# Patient Record
Sex: Male | Born: 1950 | Race: Black or African American | Hispanic: No | Marital: Married | Smoking: Never smoker
Health system: Southern US, Community
[De-identification: ages and names within clinical notes are randomized; demographics above are authoritative.]

## PROBLEM LIST (undated history)

## (undated) DIAGNOSIS — K219 Gastro-esophageal reflux disease without esophagitis: Secondary | ICD-10-CM

## (undated) DIAGNOSIS — T7840XA Allergy, unspecified, initial encounter: Secondary | ICD-10-CM

## (undated) DIAGNOSIS — Z974 Presence of external hearing-aid: Secondary | ICD-10-CM

## (undated) DIAGNOSIS — E785 Hyperlipidemia, unspecified: Secondary | ICD-10-CM

## (undated) DIAGNOSIS — H269 Unspecified cataract: Secondary | ICD-10-CM

## (undated) DIAGNOSIS — I10 Essential (primary) hypertension: Secondary | ICD-10-CM

## (undated) DIAGNOSIS — M109 Gout, unspecified: Secondary | ICD-10-CM

## (undated) DIAGNOSIS — M1990 Unspecified osteoarthritis, unspecified site: Secondary | ICD-10-CM

## (undated) DIAGNOSIS — E039 Hypothyroidism, unspecified: Secondary | ICD-10-CM

## (undated) DIAGNOSIS — J302 Other seasonal allergic rhinitis: Secondary | ICD-10-CM

## (undated) HISTORY — DX: Unspecified osteoarthritis, unspecified site: M19.90

## (undated) HISTORY — PX: CATARACT EXTRACTION, BILATERAL: SHX1313

## (undated) HISTORY — DX: Gout, unspecified: M10.9

## (undated) HISTORY — DX: Allergy, unspecified, initial encounter: T78.40XA

## (undated) HISTORY — PX: CARPAL TUNNEL RELEASE: SHX101

## (undated) HISTORY — DX: Other seasonal allergic rhinitis: J30.2

## (undated) HISTORY — DX: Unspecified cataract: H26.9

## (undated) HISTORY — DX: Hypothyroidism, unspecified: E03.9

## (undated) HISTORY — DX: Hyperlipidemia, unspecified: E78.5

## (undated) HISTORY — DX: Gastro-esophageal reflux disease without esophagitis: K21.9

## (undated) HISTORY — DX: Presence of external hearing-aid: Z97.4

## (undated) HISTORY — DX: Essential (primary) hypertension: I10

## (undated) HISTORY — PX: KNEE ARTHROSCOPY: SHX127

---

## 2002-12-29 ENCOUNTER — Ambulatory Visit (HOSPITAL_COMMUNITY): Admission: RE | Admit: 2002-12-29 | Discharge: 2002-12-29 | Payer: Self-pay | Admitting: Gastroenterology

## 2008-07-01 ENCOUNTER — Ambulatory Visit (HOSPITAL_BASED_OUTPATIENT_CLINIC_OR_DEPARTMENT_OTHER): Admission: RE | Admit: 2008-07-01 | Discharge: 2008-07-01 | Payer: Self-pay | Admitting: Orthopedic Surgery

## 2008-08-12 ENCOUNTER — Ambulatory Visit (HOSPITAL_BASED_OUTPATIENT_CLINIC_OR_DEPARTMENT_OTHER): Admission: RE | Admit: 2008-08-12 | Discharge: 2008-08-12 | Payer: Self-pay | Admitting: Orthopedic Surgery

## 2009-06-25 ENCOUNTER — Encounter: Admission: RE | Admit: 2009-06-25 | Discharge: 2009-06-25 | Payer: Self-pay | Admitting: Orthopedic Surgery

## 2009-11-23 ENCOUNTER — Encounter: Admission: RE | Admit: 2009-11-23 | Discharge: 2010-01-13 | Payer: Self-pay | Admitting: Orthopedic Surgery

## 2011-02-14 NOTE — Op Note (Signed)
NAME:  Brendan Simpson, Brendan Simpson NO.:  [id number]   MEDICAL RECORD NO.:  [id number]          PATIENT TYPE:  AMB   LOCATION:  DSC                          FACILITY:  [facility]   PHYSICIAN:  Artist Pais. Weingold, M.D.DATE OF BIRTH:  02/21/1950   DATE OF PROCEDURE:  08/12/2008  DATE OF DISCHARGE:                               OPERATIVE REPORT   PREOPERATIVE DIAGNOSIS:  Chronic left carpal tunnel syndrome.   POSTOPERATIVE DIAGNOSIS:  Chronic left carpal tunnel syndrome.   PROCEDURE:  Carpal tunnel release.   SURGEON:  Artist Pais. Mina Marble, MD   ASSISTANT:  None.   ANESTHESIA:  General.   TOURNIQUET TIME:  10 minutes.   COMPLICATIONS:  None.   DRAINS:  None.   OPERATIVE REPORT:  The patient was taken to the operating suite.  After  induction of adequate general anesthesia, the left upper extremity was  prepped and draped in the sterile fashion.  An Esmarch was used to  exsanguinate the limb.  Tourniquet was inflated to 250 mmHg.  At this  point in time, a 2-cm incision was made in the palmar aspect of the left  hand in line with the last metacarpal starting at Kaplan's cardinal  line.  Skin was incised.  Palmar fascia was identified and split.  Distal edge of the transverse carpal ligament identified with a 15  blade.  The median nerve was identified and protected with Market researcher.  Remaining aspects of the transverse carpal ligament then  divided under direct vision using curved and blunt scissors.  Canal was  inspected.  There were no osseous lesions or ganglions present.  It was  irrigated and nicely closed with a 3-0 Prolene subcuticular stitch.  Steri-Strips, 4 x 4s fluffs, and compressive dressing was applied.  The  patient tolerated the procedure well and went to recovery room in stable  fashion.      Artist Pais Mina Marble, M.D.  Electronically Signed     MAW/MEDQ  D:  08/12/2008  T:  08/12/2008  Job:  [id number]

## 2011-02-14 NOTE — Op Note (Signed)
NAME:  JAEDYN, LARD NO.:  [id number]   MEDICAL RECORD NO.:  [id number]          PATIENT TYPE:  AMB   LOCATION:  DSC                          FACILITY:  [facility]   PHYSICIAN:  Artist Pais. Weingold, M.D.DATE OF BIRTH:  02/21/1950   DATE OF PROCEDURE:  07/01/2008  DATE OF DISCHARGE:                               OPERATIVE REPORT   PREOPERATIVE DIAGNOSIS:  Chronic right carpal tunnel syndrome.   POSTOPERATIVE DIAGNOSIS:  Chronic right carpal tunnel syndrome.   PROCEDURE:  Right carpal tunnel release.   SURGEON:  Artist Pais. Mina Marble, MD   ASSISTANT:  None.   ANESTHESIA:  General.   TOURNIQUET TIME:  10 minutes.   COMPLICATIONS:  None.   DRAINS:  None.   OPERATIVE REPORT:  The patient was taken to the operating suite.  After  induction of adequate general anesthesia, right upper extremity was  prepped and draped in the usual sterile fashion.  An Esmarch was used to  exsanguinate the limb.  Tourniquet was inflated to 250 mmHg.  At this  point in time, a 2-cm incision was made in the palmar aspect of the  right hand in line with metacarpal starting at [facility] cardinal line.  The skin was incised.  The palmar fascia was identified and split.  The  distal edge of the transverse carpal ligament was identified and split  with a #15 blade.  The median nerve was identified and protected with a  Therapist, nutritional.  The remaining aspects of the transverse carpal ligament  were then divided under direct vision using curved blunt scissors.  The  canal was inspected.  No osseous lesions or ganglions were present.  It  was irrigated and loosely closed with 3-0 Prolene subcuticular stitch.  Steri-Strips, 4 x 4s fluffs, and a compressive dressing was applied.  The patient tolerated the procedure well and went to recovery room in  stable fashion.      Artist Pais Mina Marble, M.D.  Electronically Signed     MAW/MEDQ  D:  07/01/2008  T:  07/01/2008  Job:  [id number]

## 2011-02-17 NOTE — Op Note (Signed)
   NAME:  Brendan Simpson, Brendan Simpson                         ACCOUNT NO.:  [id number]   MEDICAL RECORD NO.:  [id number]                   PATIENT TYPE:  AMB   LOCATION:  ENDO                                 FACILITY:  [facility]   PHYSICIAN:  Anselmo Rod, M.D.               DATE OF BIRTH:  02/21/1950   DATE OF PROCEDURE:  12/29/2002  DATE OF DISCHARGE:                                 OPERATIVE REPORT   PROCEDURE PERFORMED:  Screening colonoscopy.   ENDOSCOPIST:  Charna Elizabeth, M.D.   INSTRUMENT USED:  Olympus video colonoscope.   INDICATIONS FOR PROCEDURE:  The patient is a 60 year old African-American  male undergoing screening colonoscopy to rule out colonic polyps, masses,  etc.   PREPROCEDURE PREPARATION:  Informed consent was procured from the patient.  The patient was fasted for eight hours prior to the procedure and prepped  with a bottle of MiraLax and Gatorade the night prior to the procedure.   PREPROCEDURE PHYSICAL:  The patient had stable vital signs.  Neck supple.  Chest clear to auscultation.  S1 and S2 regular.  Abdomen soft with normal  bowel sounds.   DESCRIPTION OF PROCEDURE:  The patient was placed in left lateral decubitus  position and sedated with 70 mg of Demerol and 7 mg of Versed intravenously.  Once the patient was adequately sedated and maintained on low flow oxygen  and continuous cardiac monitoring, the Olympus video colonoscope was  advanced from the rectum and terminal ileum with some difficulty.  There was  some residual stool in the colon and multiple washes were done.  No masses,  polyps, erosions, ulcerations or diverticula were seen.  The terminal ileum  appeared healthy without lesions.  Some residual stool as seen in the  dependent areas of the colon.  Therefore, small lesions could have been  missed.   IMPRESSION:  1. Essentially unrevealing colonoscopy to the terminal ileum.  2. Some residual stool in the colon, small lesions could have been  missed.    RECOMMENDATIONS:  1. Repeat colorectal cancer screening is recommended for the patient in the     next five years unless the patient develops any abnormal symptoms in the     interim.  2. Outpatient follow-up on a p.r.n. basis.                                                   Anselmo Rod, M.D.    JNM/MEDQ  D:  12/29/2002  T:  12/29/2002  Job:  [id number]   cc:   Marjory Lies, M.D.  [address]  [place]  [place] [address]  Fax: [phone]

## 2011-04-12 ENCOUNTER — Emergency Department (HOSPITAL_COMMUNITY): Payer: 59

## 2011-04-12 ENCOUNTER — Emergency Department (HOSPITAL_COMMUNITY)
Admission: EM | Admit: 2011-04-12 | Discharge: 2011-04-12 | Disposition: A | Discharge status: Home / Self Care | Payer: 59 | Attending: Emergency Medicine | Admitting: Emergency Medicine

## 2011-04-12 DIAGNOSIS — Z7982 Long term (current) use of aspirin: Secondary | ICD-10-CM | POA: Insufficient documentation

## 2011-04-12 DIAGNOSIS — R51 Headache: Secondary | ICD-10-CM | POA: Insufficient documentation

## 2011-04-12 DIAGNOSIS — Z79899 Other long term (current) drug therapy: Secondary | ICD-10-CM | POA: Insufficient documentation

## 2011-04-12 DIAGNOSIS — M109 Gout, unspecified: Secondary | ICD-10-CM | POA: Insufficient documentation

## 2011-04-12 DIAGNOSIS — K219 Gastro-esophageal reflux disease without esophagitis: Secondary | ICD-10-CM | POA: Insufficient documentation

## 2011-04-12 DIAGNOSIS — E7800 Pure hypercholesterolemia, unspecified: Secondary | ICD-10-CM | POA: Insufficient documentation

## 2011-04-12 LAB — CSF CELL COUNT WITH DIFFERENTIAL
RBC Count, CSF: 0 /mm3
RBC Count, CSF: 1 /mm3 — ABNORMAL HIGH
Tube #: 1
Tube #: 4
WBC, CSF: 2 /mm3 (ref 0–5)
WBC, CSF: 2 /mm3 (ref 0–5)

## 2011-04-12 LAB — CBC
HCT: 45.5 % (ref 39.0–52.0)
Hemoglobin: 16.2 g/dL (ref 13.0–17.0)
MCH: 31.3 pg (ref 26.0–34.0)
MCHC: 35.6 g/dL (ref 30.0–36.0)
MCV: 87.8 fL (ref 78.0–100.0)
Platelets: 207 10*3/uL (ref 150–400)
RBC: 5.18 MIL/uL (ref 4.22–5.81)
RDW: 12.5 % (ref 11.5–15.5)
WBC: 7.4 10*3/uL (ref 4.0–10.5)

## 2011-04-12 LAB — GRAM STAIN

## 2011-04-12 LAB — PROTIME-INR
INR: 0.93 (ref 0.00–1.49)
Prothrombin Time: 12.7 seconds (ref 11.6–15.2)

## 2011-04-12 LAB — BASIC METABOLIC PANEL
BUN: 9 mg/dL (ref 6–23)
CO2: 32 mEq/L (ref 19–32)
Calcium: 10.2 mg/dL (ref 8.4–10.5)
Chloride: 100 mEq/L (ref 96–112)
Creatinine, Ser: 0.94 mg/dL (ref 0.50–1.35)
GFR calc Af Amer: >60 mL/min (ref >60)
GFR calc non Af Amer: >60 mL/min (ref >60)
Glucose, Bld: 111 mg/dL — ABNORMAL HIGH (ref 70–99)
Potassium: 4.1 mEq/L (ref 3.5–5.1)
Sodium: 141 mEq/L (ref 135–145)

## 2011-04-12 LAB — GLUCOSE, CSF: Glucose, CSF: 69 mg/dL (ref 43–76)

## 2011-04-12 LAB — APTT: aPTT: 29 seconds (ref 24–37)

## 2011-04-12 LAB — PROTEIN, CSF: Total  Protein, CSF: 41 mg/dL (ref 15–45)

## 2011-04-16 LAB — CSF CULTURE W GRAM STAIN: Culture: NO GROWTH

## 2011-07-03 LAB — POCT HEMOGLOBIN-HEMACUE
Hemoglobin: 15.6
Hemoglobin: 15.6

## 2011-07-04 LAB — POCT HEMOGLOBIN-HEMACUE: Hemoglobin: 14.9

## 2016-02-03 DIAGNOSIS — H9190 Unspecified hearing loss, unspecified ear: Secondary | ICD-10-CM | POA: Insufficient documentation

## 2016-02-03 DIAGNOSIS — E039 Hypothyroidism, unspecified: Secondary | ICD-10-CM | POA: Insufficient documentation

## 2016-02-03 DIAGNOSIS — K219 Gastro-esophageal reflux disease without esophagitis: Secondary | ICD-10-CM | POA: Insufficient documentation

## 2016-02-03 DIAGNOSIS — M109 Gout, unspecified: Secondary | ICD-10-CM | POA: Insufficient documentation

## 2016-02-03 DIAGNOSIS — M1990 Unspecified osteoarthritis, unspecified site: Secondary | ICD-10-CM | POA: Insufficient documentation

## 2016-02-03 DIAGNOSIS — E785 Hyperlipidemia, unspecified: Secondary | ICD-10-CM | POA: Insufficient documentation

## 2016-02-03 DIAGNOSIS — I10 Essential (primary) hypertension: Secondary | ICD-10-CM | POA: Insufficient documentation

## 2016-02-03 DIAGNOSIS — R413 Other amnesia: Secondary | ICD-10-CM | POA: Insufficient documentation

## 2016-02-03 HISTORY — DX: Hypothyroidism, unspecified: E03.9

## 2016-03-08 ENCOUNTER — Encounter: Payer: Self-pay | Admitting: Podiatry

## 2016-03-08 ENCOUNTER — Ambulatory Visit (INDEPENDENT_AMBULATORY_CARE_PROVIDER_SITE_OTHER): Payer: 59 | Admitting: Podiatry

## 2016-03-08 ENCOUNTER — Ambulatory Visit (INDEPENDENT_AMBULATORY_CARE_PROVIDER_SITE_OTHER): Payer: 59

## 2016-03-08 VITALS — BP 130/75 | HR 73 | Resp 16 | Ht 68.0 in | Wt 180.0 lb

## 2016-03-08 DIAGNOSIS — R52 Pain, unspecified: Secondary | ICD-10-CM

## 2016-03-08 DIAGNOSIS — L84 Corns and callosities: Secondary | ICD-10-CM | POA: Diagnosis not present

## 2016-03-08 NOTE — Progress Notes (Signed)
Subjective:     Patient ID: Brendan CheeseDavid L Simpson, male   DOB: August 13, 1951, 65 y.o.   MRN: [id number]  HPI patient presents stating I have this very painful corn callus on the outside of my left foot that get inflamed and sore. Patient states that it's been going on for a few months and is gradually gotten worse and he's tried to trim it without relief   Review of Systems  All other systems reviewed and are negative.      Objective:   Physical Exam  Constitutional: He is oriented to person, place, and time.  Cardiovascular: Intact distal pulses.   Musculoskeletal: Normal range of motion.  Neurological: He is oriented to person, place, and time.  Skin: Skin is warm.  Nursing note and vitals reviewed.  neurovascular status intact muscle strength adequate range of motion within normal limits with patient found to have keratotic lesion on the outside of the left fifth metatarsal with irritation and a lucent light core. It is very tender when pressed with mild fluid accumulation associated with it. 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 At this time debridement accomplished with sharp sterile instrumentation and no iatrogenic bleeding was noted. May require further treatment depending on response and will be seen back as needed

## 2016-03-08 NOTE — Progress Notes (Signed)
   Subjective:    Patient ID: Brendan CheeseDavid L Evrard, male    DOB: [birth year], 65 y.o.   MRN: [id number]  HPI "A callus just came up on my foot about a month ago.  It's sore.  I feel like I need to have it cut off.  I haven't done anything to it.  It feels a little bit better since I made this appointment.  Walking aggravates it.  My shoes seem to be okay."    Review of Systems  Musculoskeletal: Positive for arthralgias.       Gout  Allergic/Immunologic: Positive for environmental allergies.  All other systems reviewed and are negative.      Objective:   Physical Exam        Assessment & Plan:

## 2016-03-09 DIAGNOSIS — R21 Rash and other nonspecific skin eruption: Secondary | ICD-10-CM | POA: Insufficient documentation

## 2016-09-06 LAB — HEPATIC FUNCTION PANEL
ALT: 31 U/L (ref 10–40)
AST: 21 U/L (ref 14–40)
Alkaline Phosphatase: 58 U/L (ref 25–125)
Bilirubin, Total: 0.3 mg/dL

## 2016-09-06 LAB — BASIC METABOLIC PANEL
BUN: 15 mg/dL (ref 4–21)
Creatinine: 1 mg/dL (ref 0.6–1.3)
Glucose: 112 mg/dL
Potassium: 4.1 mmol/L (ref 3.4–5.3)
Sodium: 139 mmol/L (ref 137–147)

## 2016-09-06 LAB — CBC AND DIFFERENTIAL
HCT: 45 % (ref 41–53)
Hemoglobin: 15.4 g/dL (ref 13.5–17.5)
Neutrophils Absolute: 4 /uL
Platelets: 201 10*3/uL (ref 150–399)
WBC: 7 10^3/mL

## 2016-09-06 LAB — TSH: TSH: 0.79 u[IU]/mL (ref 0.41–5.90)

## 2016-09-06 LAB — HEMOGLOBIN A1C: Hemoglobin A1C: 5.8

## 2016-09-06 LAB — LIPID PANEL
Cholesterol: 189 mg/dL (ref 0–200)
HDL: 41 mg/dL (ref 35–70)
LDL Cholesterol: 111 mg/dL
Triglycerides: 196 mg/dL — AB (ref 40–160)

## 2016-12-18 ENCOUNTER — Other Ambulatory Visit: Payer: Self-pay

## 2016-12-20 ENCOUNTER — Encounter: Payer: Self-pay | Admitting: Surgical

## 2016-12-28 ENCOUNTER — Encounter: Payer: Self-pay | Admitting: *Deleted

## 2016-12-28 ENCOUNTER — Telehealth: Payer: Self-pay | Admitting: *Deleted

## 2016-12-28 NOTE — Telephone Encounter (Signed)
PreVisit Call completed. Pt states he would like his blood pressure checked. States he occasionally becomes lightheaded (maybe every 2 weeks) and when he checks his blood pressure the readings are around 180/96. Denies headaches and blurred vision. States he takes his BP meds in the morning. He will start monitoring his BP morning and night and bring readings in with him.

## 2017-01-01 ENCOUNTER — Ambulatory Visit: Payer: 59 | Admitting: Family Medicine

## 2017-01-01 ENCOUNTER — Ambulatory Visit (INDEPENDENT_AMBULATORY_CARE_PROVIDER_SITE_OTHER): Payer: 59 | Admitting: Family Medicine

## 2017-01-01 ENCOUNTER — Encounter: Payer: Self-pay | Admitting: Family Medicine

## 2017-01-01 VITALS — BP 154/90 | HR 77 | Temp 98.4°F | Ht 65.35 in | Wt 195.0 lb

## 2017-01-01 DIAGNOSIS — I10 Essential (primary) hypertension: Secondary | ICD-10-CM | POA: Diagnosis not present

## 2017-01-01 NOTE — Progress Notes (Signed)
Pre visit review using our clinic review tool, if applicable. No additional management support is needed unless otherwise documented below in the visit note. 

## 2017-01-01 NOTE — Patient Instructions (Signed)
DASH Eating Plan DASH stands for "Dietary Approaches to Stop Hypertension." The DASH eating plan is a healthy eating plan that has been shown to reduce high blood pressure (hypertension). It may also reduce your risk for type 2 diabetes, heart disease, and stroke. The DASH eating plan may also help with weight loss. What are tips for following this plan? General guidelines  Avoid eating more than 2,300 mg (milligrams) of salt (sodium) a day. If you have hypertension, you may need to reduce your sodium intake to 1,500 mg a day.  Limit alcohol intake to no more than 1 drink a day for nonpregnant women and 2 drinks a day for men. One drink equals 12 oz of beer, 5 oz of wine, or 1 oz of hard liquor.  Work with your health care provider to maintain a healthy body weight or to lose weight. Ask what an ideal weight is for you.  Get at least 30 minutes of exercise that causes your heart to beat faster (aerobic exercise) most days of the week. Activities may include walking, swimming, or biking.  Work with your health care provider or diet and nutrition specialist (dietitian) to adjust your eating plan to your individual calorie needs. Reading food labels  Check food labels for the amount of sodium per serving. Choose foods with less than 5 percent of the Daily Value of sodium. Generally, foods with less than 300 mg of sodium per serving fit into this eating plan.  To find whole grains, look for the word "whole" as the first word in the ingredient list. Shopping  Buy products labeled as "low-sodium" or "no salt added."  Buy fresh foods. Avoid canned foods and premade or frozen meals. Cooking  Avoid adding salt when cooking. Use salt-free seasonings or herbs instead of table salt or sea salt. Check with your health care provider or pharmacist before using salt substitutes.  Do not fry foods. Cook foods using healthy methods such as baking, boiling, grilling, and broiling instead.  Cook with  heart-healthy oils, such as olive, canola, soybean, or sunflower oil. Meal planning   Eat a balanced diet that includes: ? 5 or more servings of fruits and vegetables each day. At each meal, try to fill half of your plate with fruits and vegetables. ? Up to 6-8 servings of whole grains each day. ? Less than 6 oz of lean meat, poultry, or fish each day. A 3-oz serving of meat is about the same size as a deck of cards. One egg equals 1 oz. ? 2 servings of low-fat dairy each day. ? A serving of nuts, seeds, or beans 5 times each week. ? Heart-healthy fats. Healthy fats called Omega-3 fatty acids are found in foods such as flaxseeds and coldwater fish, like sardines, salmon, and mackerel.  Limit how much you eat of the following: ? Canned or prepackaged foods. ? Food that is high in trans fat, such as fried foods. ? Food that is high in saturated fat, such as fatty meat. ? Sweets, desserts, sugary drinks, and other foods with added sugar. ? Full-fat dairy products.  Do not salt foods before eating.  Try to eat at least 2 vegetarian meals each week.  Eat more home-cooked food and less restaurant, buffet, and fast food.  When eating at a restaurant, ask that your food be prepared with less salt or no salt, if possible. What foods are recommended? The items listed may not be a complete list. Talk with your dietitian about what   dietary choices are best for you. Grains Whole-grain or whole-wheat bread. Whole-grain or whole-wheat pasta. Brown rice. Oatmeal. Quinoa. Bulgur. Whole-grain and low-sodium cereals. Pita bread. Low-fat, low-sodium crackers. Whole-wheat flour tortillas. Vegetables Fresh or frozen vegetables (raw, steamed, roasted, or grilled). Low-sodium or reduced-sodium tomato and vegetable juice. Low-sodium or reduced-sodium tomato sauce and tomato paste. Low-sodium or reduced-sodium canned vegetables. Fruits All fresh, dried, or frozen fruit. Canned fruit in natural juice (without  added sugar). Meat and other protein foods Skinless chicken or turkey. Ground chicken or turkey. Pork with fat trimmed off. Fish and seafood. Egg whites. Dried beans, peas, or lentils. Unsalted nuts, nut butters, and seeds. Unsalted canned beans. Lean cuts of beef with fat trimmed off. Low-sodium, lean deli meat. Dairy Low-fat (1%) or fat-free (skim) milk. Fat-free, low-fat, or reduced-fat cheeses. Nonfat, low-sodium ricotta or cottage cheese. Low-fat or nonfat yogurt. Low-fat, low-sodium cheese. Fats and oils Soft margarine without trans fats. Vegetable oil. Low-fat, reduced-fat, or light mayonnaise and salad dressings (reduced-sodium). Canola, safflower, olive, soybean, and sunflower oils. Avocado. Seasoning and other foods Herbs. Spices. Seasoning mixes without salt. Unsalted popcorn and pretzels. Fat-free sweets. What foods are not recommended? The items listed may not be a complete list. Talk with your dietitian about what dietary choices are best for you. Grains Baked goods made with fat, such as croissants, muffins, or some breads. Dry pasta or rice meal packs. Vegetables Creamed or fried vegetables. Vegetables in a cheese sauce. Regular canned vegetables (not low-sodium or reduced-sodium). Regular canned tomato sauce and paste (not low-sodium or reduced-sodium). Regular tomato and vegetable juice (not low-sodium or reduced-sodium). Pickles. Olives. Fruits Canned fruit in a light or heavy syrup. Fried fruit. Fruit in cream or butter sauce. Meat and other protein foods Fatty cuts of meat. Ribs. Fried meat. Bacon. Sausage. Bologna and other processed lunch meats. Salami. Fatback. Hotdogs. Bratwurst. Salted nuts and seeds. Canned beans with added salt. Canned or smoked fish. Whole eggs or egg yolks. Chicken or turkey with skin. Dairy Whole or 2% milk, cream, and half-and-half. Whole or full-fat cream cheese. Whole-fat or sweetened yogurt. Full-fat cheese. Nondairy creamers. Whipped toppings.  Processed cheese and cheese spreads. Fats and oils Butter. Stick margarine. Lard. Shortening. Ghee. Bacon fat. Tropical oils, such as coconut, palm kernel, or palm oil. Seasoning and other foods Salted popcorn and pretzels. Onion salt, garlic salt, seasoned salt, table salt, and sea salt. Worcestershire sauce. Tartar sauce. Barbecue sauce. Teriyaki sauce. Soy sauce, including reduced-sodium. Steak sauce. Canned and packaged gravies. Fish sauce. Oyster sauce. Cocktail sauce. Horseradish that you find on the shelf. Ketchup. Mustard. Meat flavorings and tenderizers. Bouillon cubes. Hot sauce and Tabasco sauce. Premade or packaged marinades. Premade or packaged taco seasonings. Relishes. Regular salad dressings. Where to find more information:  National Heart, Lung, and Blood Institute: www.nhlbi.nih.gov  American Heart Association: www.heart.org Summary  The DASH eating plan is a healthy eating plan that has been shown to reduce high blood pressure (hypertension). It may also reduce your risk for type 2 diabetes, heart disease, and stroke.  With the DASH eating plan, you should limit salt (sodium) intake to 2,300 mg a day. If you have hypertension, you may need to reduce your sodium intake to 1,500 mg a day.  When on the DASH eating plan, aim to eat more fresh fruits and vegetables, whole grains, lean proteins, low-fat dairy, and heart-healthy fats.  Work with your health care provider or diet and nutrition specialist (dietitian) to adjust your eating plan to your individual   calorie needs. This information is not intended to replace advice given to you by your health care provider. Make sure you discuss any questions you have with your health care provider. Document Released: 09/07/2011 Document Revised: 09/11/2016 Document Reviewed: 09/11/2016 Elsevier Interactive Patient Education  2017 Elsevier Inc.  

## 2017-01-01 NOTE — Progress Notes (Signed)
Brendan Simpson is a 66 y.o. male is here to [facility].   History of Present Illness:   Chief Complaint  Patient presents with  . Establish Care  . Hypertension   Hypertension  This is a chronic problem. The problem is uncontrolled. Pertinent negatives include no blurred vision, chest pain, headaches, malaise/fatigue, neck pain, palpitations or shortness of breath. Risk factors for coronary artery disease include male gender, obesity, sedentary lifestyle, dyslipidemia and family history. Past treatments include calcium channel blockers. The current treatment provides mild improvement. Compliance problems include diet and exercise.  There is no history of angina, kidney disease or retinopathy.   Health Maintenance Due  Topic Date Due  . Hepatitis C Screening  November 28, 1950  . HIV Screening  02/21/1968  . TETANUS/TDAP  09/19/2016   PMHx, SurgHx, SocialHx, Medications, and Allergies were reviewed in the Visit Navigator and updated as appropriate.   Past Medical History:  Diagnosis Date  . GERD (gastroesophageal reflux disease)   . Gout   . Hypertension   . Hypothyroidism 02/03/2016  . Pure hypercholesterolemia 02/03/2016  . Thyroid disease    Past Surgical History:  Procedure Laterality Date  . CARPAL TUNNEL RELEASE Bilateral   . KNEE ARTHROSCOPY Left    Family History  Problem Relation Age of Onset  . Breast cancer Mother   . Heart disease Father    Social History  Substance Use Topics  . Smoking status: Never Smoker  . Smokeless tobacco: Never Used  . Alcohol use No     Comment: every now and then   Current Medications and Allergies:   .  allopurinol (ZYLOPRIM) 100 MG tablet, Take 100 mg by mouth daily., Disp: , Rfl:  .  amLODipine (NORVASC) 5 MG tablet, Take 5 mg by mouth daily., Disp: , Rfl:  .  aspirin EC 81 MG tablet, Take 81 mg by mouth daily., Disp: , Rfl:  .  atorvastatin (LIPITOR) 40 MG tablet, Take 20 mg by mouth daily. , Disp: , Rfl:  .  azelaic acid  (AZELEX) 20 % cream, Apply topically as needed. After skin is thoroughly washed and patted dry, gently but thoroughly massage a thin film of azelaic acid cream into the affected area twice daily, in the morning and evening., Disp: , Rfl:  .  Co-Enzyme Q-10 30 MG CAPS, Take 200 mg by mouth daily., Disp: , Rfl:  .  esomeprazole (NEXIUM) 40 MG capsule, Take 40 mg by mouth daily., Disp: , Rfl:  .  Flaxseed, Linseed, (FLAXSEED OIL) 1000 MG CAPS, Take 1,000 mg by mouth daily., Disp: , Rfl:  .  levothyroxine (SYNTHROID, LEVOTHROID) 88 MCG tablet, Take 88 mcg by mouth daily before breakfast., Disp: , Rfl:   No Known Allergies   Review of Systems:   Review of Systems  Constitutional: Negative for chills, fever, malaise/fatigue and weight loss.  HENT: Positive for hearing loss. Negative for congestion, ear discharge, ear pain, nosebleeds, sinus pain, sore throat and tinnitus.   Eyes: Negative for blurred vision, double vision, pain and redness.  Respiratory: Negative for cough, shortness of breath and wheezing.   Cardiovascular: Negative for chest pain, palpitations and leg swelling.  Gastrointestinal: Negative for abdominal pain, blood in stool, constipation, diarrhea, heartburn, nausea and vomiting.  Genitourinary: Positive for frequency. Negative for dysuria and urgency.  Musculoskeletal: Negative for back pain, falls, joint pain and neck pain.  Skin: Negative for itching and rash.  Neurological: Negative for dizziness, tingling, tremors, speech change, seizures, weakness and  headaches.  Endo/Heme/Allergies: Does not bruise/bleed easily.  Psychiatric/Behavioral: Negative for depression, hallucinations, memory loss, substance abuse and suicidal ideas. The patient is not nervous/anxious and does not have insomnia.     Vitals:   Vitals:   01/01/17 1029  BP: (!) 154/90  Pulse: 77  Temp: 98.4 F (36.9 C)  TempSrc: Oral  SpO2: 97%  Weight: 195 lb (88.5 kg)  Height: 5' 5.35" (1.66 m)     Body  mass index is 32.1 kg/m.   Physical Exam:   Physical Exam  Constitutional: He is oriented to person, place, and time. He appears well-developed and well-nourished. No distress.  HENT:  Head: Normocephalic and atraumatic.  Right Ear: External ear normal.  Left Ear: External ear normal.  Nose: Nose normal.  Mouth/Throat: Oropharynx is clear and moist.  Eyes: Conjunctivae and EOM are normal. Pupils are equal, round, and reactive to light.  Neck: Normal range of motion. Neck supple.  Cardiovascular: Normal rate, regular rhythm, normal heart sounds and intact distal pulses.   Pulmonary/Chest: Effort normal and breath sounds normal.  Abdominal: Soft. Bowel sounds are normal.  Musculoskeletal: Normal range of motion.  Neurological: He is alert and oriented to person, place, and time.  Skin: Skin is warm and dry.  Psychiatric: He has a normal mood and affect. His behavior is normal. Judgment and thought content normal.  Nursing note and vitals reviewed.    Assessment and Plan:    Brendan Simpson was seen today for establish care and hypertension.  Diagnoses and all orders for this visit:  Benign essential hypertension Comments: We reviewed options - medications versus lifestyle changes. The patient would like to try lifestyle changes first. I referred to the Provider Referral Exercise Program at the [facility]. Recheck in 3-4 months.    . Reviewed expectations re: course of current medical issues. . Discussed self-management of symptoms. . Outlined signs and symptoms indicating need for more acute intervention. . Patient verbalized understanding and all questions were answered. . See orders for this visit as documented in the electronic medical record. . Patient received an After Visit Summary.  Records requested if needed. I spent 30 minutes with this patient, greater than 50% was face-to-face time counseling regarding the above diagnoses.  CMA served as Neurosurgeon during this visit.  History, Physical, and Plan performed by medical provider. Documentation and orders reviewed and attested to. Helane Rima, D.O.  Helane Rima, D.O. [place], [place] 01/01/2017   Follow-up: No Follow-up on file.  No orders of the defined types were placed in this encounter.  There are no discontinued medications. No orders of the defined types were placed in this encounter.

## 2017-01-15 NOTE — Progress Notes (Signed)
[facility] PREP Progress Report   Patient Details  Name: CASSELL VOORHIES MRN: [id number] Date of Birth: [birth year] Age: 66 y.o. PCP: Helane Rima, DO  Vitals:   01/15/17 1217  BP: (!) 160/100  Pulse: 78  Resp: 18  SpO2: 98%  Weight: 191 lb 9.6 oz (86.9 kg)  Height:  (1.702 m)         [facility] Eval - 01/15/17 1200      Referral    Referring Provider Dr. Earlene Plater   Reason for referral High Cholesterol;Hypertension;Obesitity/Overweight;Family History   Program Start Date 01/15/17     Measurement   Neck measurement 17 Inches   Waist Circumference 40.5 inches   Body fat 25.1 percent     Information for Trainer   Goals to lose 10lbs   Current Exercise walks approx 4 miles every other day   Orthopedic Concerns wears knee braces with strenuous exercise   Pertinent Medical History HTN, GERD, high cholesterol   Current Barriers none     Timed Up and Go (TUGS)   Timed Up and Go Low risk <9 seconds     Mobility and Daily Activities   I find it easy to walk up or down two or more flights of stairs. 4   I have no trouble taking out the trash. 4   I do housework such as vacuuming and dusting on my own without difficulty. 4   I can easily lift a gallon of milk (8lbs). 4   I can easily walk a mile. 4   I have no trouble reaching into high cupboards or reaching down to pick up something from the floor. 4   I do not have trouble doing out-door work such as Loss adjuster, chartered, raking leaves, or gardening. 4     Mobility and Daily Activities   I feel younger than my age. 4   I feel independent. 4   I feel energetic. 4   I live an active life.  4   I feel strong. 4   I feel healthy. 4   I feel active as other people my age. 4     How fit and strong are you.   Fit and Strong Total Score 56     Past Medical History:  Diagnosis Date  . GERD (gastroesophageal reflux disease)   . Gout   . Hypertension   . Hypothyroidism 02/03/2016  . Pure hypercholesterolemia 02/03/2016   . Thyroid disease    Past Surgical History:  Procedure Laterality Date  . CARPAL TUNNEL RELEASE Bilateral   . KNEE ARTHROSCOPY Left    History  Smoking Status  . Never Smoker  Smokeless Tobacco  . Never Used       Comments:  Aarush would like to lose 10 lbs to reduce his risk for diabetes and help control his BP.  He would like to establish a strength training routine in order to continue with it when he completes the 12-week program.     Kaiel's preferred day for training is Tuesday and Thursday and preferred time is Morning (9AM -12PM)   Rose Fillers 01/15/2017, 12:20 PM

## 2017-01-19 NOTE — Progress Notes (Signed)
[facility] PREP Weekly Session   Patient Details  Name: Brendan Simpson MRN: [id number] Date of Birth: 1951-03-19 Age: 66 y.o. PCP: Helane Rima, DO  There were no vitals filed for this visit.      [facility] Weekly seesion - 01/19/17 1200      Weekly Session   Topic Discussed Calorie breakdown   Classes attended to date 1     Things you are grateful for:"grandkids"  Rose Fillers 01/19/2017, 12:42 PM

## 2017-01-24 ENCOUNTER — Ambulatory Visit: Payer: 59 | Admitting: Family Medicine

## 2017-01-24 NOTE — Progress Notes (Signed)
[facility] PREP Weekly Session   Patient Details  Name: Brendan Simpson MRN: [id number] Date of Birth: Sep 03, 1951 Age: 66 y.o. PCP: Helane Rima, DO  Vitals:   01/24/17 1358  Weight: 193 lb (87.5 kg)        [facility] Weekly seesion - 01/24/17 1300      Weekly Session   Topic Discussed Hitting roadblocks   Classes attended to date 2     Fun things you did since last meeting:"walking in park" Things you are grateful for:"my church"   Rose Fillers 01/24/2017, 1:58 PM

## 2017-01-30 ENCOUNTER — Telehealth: Payer: Self-pay | Admitting: Family Medicine

## 2017-01-30 NOTE — Telephone Encounter (Signed)
58 pages received from [facility] sent the to Dr.Wallace at [facility]. Pwr

## 2017-02-09 NOTE — Progress Notes (Signed)
[facility]   Patient Details  Name: Brendan CheeseDavid L Simpson MRN: [id number] Date of Birth: 1951/08/17 Age: 66 y.o. PCP: Helane RimaWallace, Erica, DO  Vitals:   02/09/17 1003  Weight: 192 lb (87.1 kg)        [facility] Weekly seesion - 02/09/17 1000      Weekly Session   Topic Discussed Health habits;Healthy eating tips   Classes attended to date 3     Things you are grateful for:"my wife"  Rose FillersDebbie Keyondre Hepburn 02/09/2017, 10:03 AM

## 2017-02-14 NOTE — Progress Notes (Signed)
[facility]   Patient Details  Name: Brendan Simpson MRN: [id number] Date of Birth: Dec 27, 1950 Age: 66 y.o. PCP: Helane RimaWallace, Erica, DO  Vitals:   02/14/17 1249  Weight: 189 lb (85.7 kg)        [facility] Weekly seesion - 02/14/17 1200      Weekly Session   Topic Discussed Other ways to be active   Minutes exercised this week 380 minutes  180cardio/180strength/430flexibility   Classes attended to date 4   Comments Brendan Simpson had not been reporting his minutes of exercise until this week.  He states he has been exercising regularly, however.      Thing you are grateful for:"[facility]"  Rose FillersDebbie Tabor Bartram 02/14/2017, 12:51 PM

## 2017-02-28 NOTE — Progress Notes (Signed)
[facility]   Patient Details  Name: Brendan CheeseDavid L Lybarger MRN: [id number] Date of Birth: 1950/12/23 Age: 66 y.o. PCP: Helane RimaWallace, Erica, DO  There were no vitals filed for this visit.      [facility] Weekly seesion - 02/28/17 1100      Weekly Session   Topic Discussed Health habits   Minutes exercised this week --  undocumented, but pt exercises regularly   Classes attended to date 5     Things you are grateful for:"today"  Rose FillersDebbie Onetta Spainhower 02/28/2017, 11:32 AM

## 2017-03-09 NOTE — Progress Notes (Signed)
[facility]   Patient Details  Name: Brendan CheeseDavid L Tyer MRN: [id number] Date of Birth: [birth year] Age: 66 y.o. PCP: Helane RimaWallace, Erica, DO  Vitals:   03/09/17 1000  Weight: 189 lb (85.7 kg)        [facility] Weekly seesion - 03/09/17 1000      Weekly Session   Topic Discussed Stress management and problem solving   Minutes exercised this week --  regularly coming to exercise but didn't report time   Classes attended to date 6      Fun things you did since last meeting:"walk in the park"  Rose FillersDebbie Ladale Sherburn 03/09/2017, 10:26 AM

## 2017-03-14 NOTE — Progress Notes (Signed)
[facility]   Patient Details  Name: Brendan Simpson MRN: [id number] Date of Birth: 09-23-1951 Age: 66 y.o. PCP: Brendan Simpson, Erica, Brendan Simpson  Vitals:   03/14/17 1350  Weight: 188 lb (85.3 kg)        [facility] Weekly seesion - 03/14/17 1300      Weekly Session   Topic Discussed Other  guest speaker   Minutes exercised this week --  not reported   Classes attended to date 7     Things you are grateful for:"one day at a time"    Rose FillersDebbie Adonte Vanriper 03/14/2017, 1:51 PM

## 2017-03-19 ENCOUNTER — Other Ambulatory Visit: Payer: Self-pay | Admitting: Family Medicine

## 2017-03-19 ENCOUNTER — Encounter: Payer: Self-pay | Admitting: Gastroenterology

## 2017-03-19 ENCOUNTER — Encounter: Payer: Self-pay | Admitting: Family Medicine

## 2017-03-19 ENCOUNTER — Ambulatory Visit (INDEPENDENT_AMBULATORY_CARE_PROVIDER_SITE_OTHER): Payer: 59 | Admitting: Family Medicine

## 2017-03-19 VITALS — BP 128/76 | HR 69 | Temp 98.5°F | Ht 65.35 in | Wt 187.2 lb

## 2017-03-19 DIAGNOSIS — Z1159 Encounter for screening for other viral diseases: Secondary | ICD-10-CM | POA: Diagnosis not present

## 2017-03-19 DIAGNOSIS — Z0000 Encounter for general adult medical examination without abnormal findings: Secondary | ICD-10-CM | POA: Diagnosis not present

## 2017-03-19 DIAGNOSIS — I10 Essential (primary) hypertension: Secondary | ICD-10-CM | POA: Diagnosis not present

## 2017-03-19 DIAGNOSIS — Z125 Encounter for screening for malignant neoplasm of prostate: Secondary | ICD-10-CM

## 2017-03-19 DIAGNOSIS — K2100 Gastro-esophageal reflux disease with esophagitis, without bleeding: Secondary | ICD-10-CM

## 2017-03-19 DIAGNOSIS — Z114 Encounter for screening for human immunodeficiency virus [HIV]: Secondary | ICD-10-CM

## 2017-03-19 DIAGNOSIS — Z23 Encounter for immunization: Secondary | ICD-10-CM

## 2017-03-19 LAB — PSA: PSA: 1.62 ng/mL (ref 0.10–4.00)

## 2017-03-19 MED ORDER — ATORVASTATIN CALCIUM 40 MG PO TABS: 20.0000 mg | ORAL_TABLET | Freq: Every day | ORAL | 2 refills | Status: DC

## 2017-03-19 MED ORDER — ALLOPURINOL 100 MG PO TABS: 100.0000 mg | ORAL_TABLET | Freq: Every day | ORAL | 2 refills | Status: DC

## 2017-03-19 MED ORDER — LEVOTHYROXINE SODIUM 88 MCG PO TABS: 88.0000 ug | ORAL_TABLET | Freq: Every day | ORAL | 2 refills | Status: DC

## 2017-03-19 MED ORDER — AZELAIC ACID 20 % EX CREA: TOPICAL_CREAM | CUTANEOUS | 2 refills | Status: DC | PRN

## 2017-03-19 MED ORDER — ESOMEPRAZOLE MAGNESIUM 40 MG PO CPDR: 40.0000 mg | DELAYED_RELEASE_CAPSULE | Freq: Every day | ORAL | 2 refills | Status: DC

## 2017-03-19 MED ORDER — AMLODIPINE BESYLATE 5 MG PO TABS: 5.0000 mg | ORAL_TABLET | Freq: Every day | ORAL | 2 refills | Status: DC

## 2017-03-19 NOTE — Progress Notes (Signed)
Subjective:  Brendan Simpson CMA acting as scribe for Dr. Earlene PlaterWallace.   Brendan Simpson is a 66 y.o. male and is here for a comprehensive physical exam.  HPI Patient comes in today for a physical. He has no complaints today. He is down 8 pounds from his last visit. [facility] BP normal today. Hx of GERD, on Nexium, for 15 years. Still with dyspepsia at times. Concerned about the possibility of Barrett's.   Health Maintenance Due  Topic Date Due  . Hepatitis C Screening  1951-08-03  . HIV Screening  02/21/1968   Prostate Symptoms Questionnaire: 1. Have you had the sensation of not emptying your bladder completely after you finished urinating? No. 2. Have you had to urinate again less than two hours after you finished urinating? No. 3. Have you found you stopped and started again several times when you urinated? No. 4. Have you found it difficult to postpone urination? No. 5. Have you had a weak urinary stream? No. 6. Have you had to push or strain to begin urination? No. 7. How many times did you most typically get up to urinate from the time you went to bed at night until the time you got up in the morning? 2-3  PMHx, SurgHx, SocialHx, Medications, and Allergies were reviewed in the Visit Navigator and updated as appropriate.   Past Medical History:  Diagnosis Date  . GERD (gastroesophageal reflux disease)   . Gout   . Hypertension   . Hypothyroidism 02/03/2016  . Pure hypercholesterolemia 02/03/2016  . Thyroid disease     Past Surgical History:  Procedure Laterality Date  . CARPAL TUNNEL RELEASE Bilateral   . KNEE ARTHROSCOPY Left     Family History  Problem Relation Age of Onset  . Breast cancer Mother   . Heart disease Father    Social History  Substance Use Topics  . Smoking status: Never Smoker  . Smokeless tobacco: Never Used  . Alcohol use No     Comment: every now and then   Review of Systems:   Review of Systems    Constitutional: Negative for chills, fever and malaise/fatigue.  HENT: Negative for congestion, ear pain and sore throat.   Eyes: Negative for blurred vision and double vision.  Respiratory: Negative for cough, shortness of breath and wheezing.   Cardiovascular: Negative for chest pain, palpitations and leg swelling.  Gastrointestinal: Negative for abdominal pain, diarrhea and vomiting.  Musculoskeletal: Negative for back pain, joint pain and neck pain.  Neurological: Negative for dizziness and headaches.  Psychiatric/Behavioral: Negative for depression and memory loss.   Objective:    Vitals:   03/19/17 0836  BP: 128/76  Pulse: 69  Temp: 98.5 F (36.9 C)   Body mass index is 30.82 kg/m.  General  Alert, cooperative, no distress, appears stated age  Head:  Normocephalic, without obvious abnormality, atraumatic  Eyes:  PERRL, conjunctiva/corneas clear, EOM's intact, fundi benign, both eyes       Ears:  Normal TM's and external ear canals, both ears  Nose: Nares normal, septum midline, mucosa normal, no drainage or sinus tenderness  Throat: Lips, mucosa, and tongue normal; teeth and gums normal  Neck: Supple, symmetrical, trachea midline, no adenopathy;     thyroid:  No enlargement/tenderness/nodules; no carotid bruit or JVD  Back:   Symmetric, no curvature, ROM normal, no CVA tenderness  Lungs:   Clear to auscultation bilaterally, respirations unlabored  Chest wall:  No tenderness or deformity  Heart:  Regular rate and rhythm, S1 and S2 normal, no murmur, rub or gallop  Abdomen:   Soft, non-tender, bowel sounds active all four quadrants, no masses, no organomegaly  Extremities: Extremities normal, atraumatic, no cyanosis or edema  Prostate : Not done  Skin: Skin color, texture, turgor normal, no rashes or lesions  Lymph nodes: Cervical, supraclavicular, and axillary nodes normal  Neurologic: CNII-XII grossly intact. Normal strength, sensation and reflexes throughout    AssessmentPlan:   Kohner was seen today for annual exam.  Diagnoses and all orders for this visit:  Routine physical examination  Need for prophylactic vaccination against diphtheria-tetanus-pertussis (DTP) -     Tdap vaccine greater than or equal to 7yo IM  Encounter for hepatitis C screening test for low risk patient -     Hepatitis C antibody, reflex  Encounter for screening for HIV -     HIV antibody  Chronic reflux esophagitis Comments: Worth referral to GI to discuss EGD. Continue PPI. Orders: -     Ambulatory referral to Gastroenterology  Screening PSA (prostate specific antigen) -     PSA  Benign essential hypertension Comments: BP in normal range with weight loss. I congratulated him.  REFILL OF ALL MEDICATIONS - 90 DAYS -     levothyroxine (SYNTHROID, LEVOTHROID) 88 MCG tablet; Take 1 tablet (88 mcg total) by mouth daily before breakfast. -     esomeprazole (NEXIUM) 40 MG capsule; Take 1 capsule (40 mg total) by mouth daily. -     azelaic acid (AZELEX) 20 % cream; Apply topically as needed. After skin is thoroughly washed and patted dry, gently but thoroughly massage a thin film of azelaic acid cream into the affected area twice daily, in the morning and evening. -     atorvastatin (LIPITOR) 40 MG tablet; Take 0.5 tablets (20 mg total) by mouth daily. -     amLODipine (NORVASC) 5 MG tablet; Take 1 tablet (5 mg total) by mouth daily. -     allopurinol (ZYLOPRIM) 100 MG tablet; Take 1 tablet (100 mg total) by mouth daily.   Patient Counseling: [x]   Nutrition: Stressed importance of moderation in sodium/caffeine intake, saturated fat and cholesterol, caloric balance, sufficient intake of fresh fruits, vegetables, and fiber  [x]   Stressed the importance of regular exercise.   []   Substance Abuse: Discussed cessation/primary prevention of tobacco, alcohol, or other drug use; driving or other dangerous activities under the influence; availability of treatment for  abuse.   [x]   Injury prevention: Discussed safety belts, safety helmets, smoke detector, smoking near bedding or upholstery.   []   Sexuality: Discussed sexually transmitted diseases, partner selection, use of condoms, avoidance of unintended pregnancy  and contraceptive alternatives.   [x]   Dental health: Discussed importance of regular tooth brushing, flossing, and dental visits.  [x]   Health maintenance and immunizations reviewed. Please refer to Health maintenance section.    Helane Rima, DO [facility] [place] [facility]  [place] served as Neurosurgeon during this visit. History, Physical, and Plan performed by medical provider. The above documentation has been reviewed and is accurate and complete. Helane Rima, D.O.

## 2017-03-20 LAB — HIV ANTIBODY (ROUTINE TESTING W REFLEX): HIV 1&2 Ab, 4th Generation: NONREACTIVE

## 2017-03-20 LAB — HEPATITIS C ANTIBODY: HCV Ab: NEGATIVE

## 2017-03-26 NOTE — Progress Notes (Signed)
[facility]   Patient Details  Name: Brendan CheeseDavid L Kohl MRN: [id number] Date of Birth: 07/08/1951 Age: 66 y.o. PCP: Helane RimaWallace, Erica, DO  Vitals:   03/26/17 1137  Weight: 188 lb (85.3 kg)        [facility] Weekly seesion - 03/26/17 1100      Weekly Session   Topic Discussed Importance of resistance training   Minutes exercised this week --  not reported   Classes attended to date 8     Things you are grateful for:"today"   Rose FillersDebbie Oronde Hallenbeck 03/26/2017, 11:37 AM

## 2017-04-16 NOTE — Progress Notes (Signed)
[facility]   Patient Details  Name: Brendan Simpson MRN: [id number] Date of Birth: [birth year] Age: 66 y.o. PCP: Helane RimaWallace, Erica, DO  Vitals:   04/16/17 1139  Weight: 188 lb (85.3 kg)        [facility] Weekly seesion - 04/16/17 1100      Weekly Session   Topic Discussed Other  portion control   Minutes exercised this week --  not reported   Classes attended to date 9       Rose FillersDebbie [place] Simpson 04/16/2017, 11:40 AM

## 2017-04-20 ENCOUNTER — Encounter: Payer: Self-pay | Admitting: Gastroenterology

## 2017-04-30 ENCOUNTER — Ambulatory Visit: Payer: 59 | Admitting: Gastroenterology

## 2017-05-03 ENCOUNTER — Encounter: Payer: Self-pay | Admitting: Gastroenterology

## 2017-05-03 ENCOUNTER — Ambulatory Visit (INDEPENDENT_AMBULATORY_CARE_PROVIDER_SITE_OTHER): Payer: 59 | Admitting: Gastroenterology

## 2017-05-03 VITALS — BP 150/80 | HR 80 | Ht 66.75 in | Wt 194.1 lb

## 2017-05-03 DIAGNOSIS — K219 Gastro-esophageal reflux disease without esophagitis: Secondary | ICD-10-CM

## 2017-05-03 DIAGNOSIS — R1314 Dysphagia, pharyngoesophageal phase: Secondary | ICD-10-CM

## 2017-05-03 NOTE — Patient Instructions (Signed)
If you are age 64 or older, your body mass index should be between 23-30. Your Body mass index is 30.63 kg/m. If this is out of the aforementioned range listed, please consider follow up with your Primary Care Provider.  If you are age 64 or younger, your body mass index should be between 19-25. Your Body mass index is 30.63 kg/m. If this is out of the aformentioned range listed, please consider follow up with your Primary Care Provider.   You have been scheduled for an endoscopy. Please follow written instructions given to you at your visit today. If you use inhalers (even only as needed), please bring them with you on the day of your procedure. Your physician has requested that you go to www.startemmi.com and enter the access code given to you at your visit today. This web site gives a general overview about your procedure. However, you should still follow specific instructions given to you by our office regarding your preparation for the procedure.  Thank you for choosing [facility]  Dr Amada JupiterHenry Danis III

## 2017-05-03 NOTE — Progress Notes (Addendum)
[facility] Consult Note:  History: Brendan CheeseDavid L Simpson 05/03/2017  Referring physician: Helane Simpson, Erica, DO  Reason for consult/chief complaint: Gastroesophageal Reflux (been on Nexium for a long time); Dysphagia (intermittent feeling of choking); and Heartburn (when lying flat at night)   Subjective  HPI:  This is a 66 year old man referred by primary care noted above for many years of reflux and recent dysphagia. He has been on Nexium once daily for about 15 years, and primarily has nocturnal symptoms of regurgitation pyrosis and a feeling of a lump in his throat. That feeling sometimes make him feel as if food has difficulty getting down. His weight has been increasing in recent years, he has elevated his upper body on a couple of pillows and another lumbar pillow because of some back problems. He and his wife say that one point they tried to elevate the head of the bed but she did not enjoy that. They also have a bed wedge at home that he tried briefly felt was too high. He denies nausea, vomiting, early satiety, altered bowel habits or rectal bleeding. Brendan Simpson says his last colonoscopy with Dr. Loreta Simpson in 2009 was normal. Brendan Simpson is concerned that long time reflux may have caused him some esophageal problems.  ROS:  Review of Systems  Constitutional: Negative for appetite change and unexpected weight change.  HENT: Negative for mouth sores and voice change.        Occasional hoarse voice when singing  Eyes: Negative for pain and redness.  Respiratory: Negative for cough and shortness of breath.   Cardiovascular: Negative for chest pain and palpitations.  Genitourinary: Negative for dysuria and hematuria.  Musculoskeletal: Positive for back pain. Negative for arthralgias and myalgias.  Skin: Negative for pallor and rash.  Neurological: Negative for weakness and headaches.  Hematological: Negative for adenopathy.     Past Medical History: Past Medical History:    Diagnosis Date  . Arthritis   . GERD (gastroesophageal reflux disease)   . Gout   . Hypertension   . Hypothyroidism 02/03/2016  . Pure hypercholesterolemia 02/03/2016     Past Surgical History: Past Surgical History:  Procedure Laterality Date  . CARPAL TUNNEL RELEASE Bilateral   . CATARACT EXTRACTION, BILATERAL Bilateral   . KNEE ARTHROSCOPY Left      Family History: Family History  Problem Relation Age of Onset  . Breast cancer Mother   . Heart disease Father   . Diabetes Sister   . Diabetes Brother     Social History: Social History   Social History  . Marital status: Married    Spouse name: N/A  . Number of children: 2  . Years of education: N/A   Occupational History  . school bus driver    Social History Main Topics  . Smoking status: Never Smoker  . Smokeless tobacco: Never Used  . Alcohol use 0.0 oz/week     Comment: occasioinal  . Drug use: No  . Sexual activity: Yes    Partners: Female   Other Topics Concern  . None   Social History Narrative  . None   Part-time school bus driver  Allergies: No Known Allergies  Outpatient Meds: Current Outpatient Prescriptions  Medication Sig Dispense Refill  . allopurinol (ZYLOPRIM) 100 MG tablet Take 1 tablet (100 mg total) by mouth daily. 90 tablet 2  . amLODipine (NORVASC) 5 MG tablet Take 1 tablet (5 mg total) by mouth daily. 90 tablet 2  . aspirin EC 81 MG tablet Take  81 mg by mouth daily.    Marland Kitchen. atorvastatin (LIPITOR) 40 MG tablet Take 0.5 tablets (20 mg total) by mouth daily. 90 tablet 2  . azelaic acid (AZELEX) 20 % cream Apply topically as needed. After skin is thoroughly washed and patted dry, gently but thoroughly massage a thin film of azelaic acid cream into the affected area twice daily, in the morning and evening. 50 g 2  . Co-Enzyme Q-10 30 MG CAPS Take 200 mg by mouth daily.    Marland Kitchen. esomeprazole (NEXIUM) 40 MG capsule Take 1 capsule (40 mg total) by mouth daily. 90 capsule 2  . Flaxseed,  Linseed, (FLAXSEED OIL) 1000 MG CAPS Take 1,000 mg by mouth daily.    . Glucosamine-Chondroit-Vit C-Mn (GLUCOSAMINE 1500 COMPLEX PO) Take 1 tablet by mouth daily.    Marland Kitchen. levothyroxine (SYNTHROID, LEVOTHROID) 88 MCG tablet Take 1 tablet (88 mcg total) by mouth daily before breakfast. 90 tablet 2  . Probiotic Product (PROBIOTIC DAILY PO) Take 1 tablet by mouth daily.     No current facility-administered medications for this visit.       ___________________________________________________________________ Objective   Exam:  BP (!) 150/80 (BP Location: Left Arm, Patient Position: Sitting, Cuff Size: Normal)   Pulse 80   Ht 5' 6.75" (1.695 m) Comment: height measured without shoes  Wt 194 lb 2 oz (88.1 kg)   BMI 30.63 kg/m    General: this is a(n) Overweight well-appearing middle-aged man .  Eyes: sclera anicteric, no redness  ENT: oral mucosa moist without lesions, no cervical or supraclavicular lymphadenopathy, good dentition  CV: RRR without murmur, S1/S2, no JVD, no peripheral edema  Resp: clear to auscultation bilaterally, normal RR and effort noted  GI: soft, no tenderness, with active bowel sounds. No guarding or palpable organomegaly noted.  Skin; warm and dry, no rash or jaundice noted  Neuro: awake, alert and oriented x 3. Normal gross motor function and fluent speech  Labs: CBC Latest Ref Rng & Units 09/06/2016 04/12/2011 08/12/2008  WBC 10:3/mL 7.0 7.4 -  Hemoglobin 13.5 - 17.5 g/dL 40.915.4 81.116.2 91.414.9 POINT OF CARE RESULT  Hematocrit 41 - 53 % 45 45.5 -  Platelets 150 - 399 K/L 201 207 -    Assessment: Encounter Diagnoses  Name Primary?  . Gastroesophageal reflux disease, esophagitis presence not specified Yes  . Pharyngoesophageal dysphagia     Many years of GERD symptoms, particularly when laying flat. We discussed both medicines and nonpharmacologic treatment for GERD. Written information was given to him about this, especially the need or dietary changes, not  eating within a few hours of bed, and effectively elevating the head of bed ideally with a bed wedge or dedicated incline pillow.Weight loss would almost certainly help in as well. His throat discomfort is probably from chronic reflux irritation, less likely mechanical obstruction such as stricture or neoplasia. We also discussed the risk of Barrett's esophagus with long-term reflux. Plan:  EGD, he is agreeable  The benefits and risks of the planned procedure were described in detail with the patient or (when appropriate) their health care proxy.  Risks were outlined as including, but not limited to, bleeding, infection, perforation, adverse medication reaction leading to cardiac or pulmonary decompensation, or pancreatitis (if ERCP).  The limitation of incomplete mucosal visualization was also discussed.  No guarantees or warranties were given.   Thank you for the courtesy of this consult.  Please call me with any questions or concerns.  Charlie PitterHenry L Danis III  CC:  Brendan Rima, DO

## 2017-05-08 ENCOUNTER — Ambulatory Visit (AMBULATORY_SURGERY_CENTER): Payer: 59 | Admitting: Gastroenterology

## 2017-05-08 ENCOUNTER — Encounter: Payer: Self-pay | Admitting: Gastroenterology

## 2017-05-08 VITALS — BP 133/76 | HR 95 | Temp 97.3°F | Resp 13 | Ht 66.75 in | Wt 194.0 lb

## 2017-05-08 DIAGNOSIS — K222 Esophageal obstruction: Secondary | ICD-10-CM | POA: Diagnosis not present

## 2017-05-08 DIAGNOSIS — R1310 Dysphagia, unspecified: Secondary | ICD-10-CM | POA: Diagnosis not present

## 2017-05-08 DIAGNOSIS — R1319 Other dysphagia: Secondary | ICD-10-CM

## 2017-05-08 DIAGNOSIS — K219 Gastro-esophageal reflux disease without esophagitis: Secondary | ICD-10-CM | POA: Diagnosis not present

## 2017-05-08 MED ORDER — SODIUM CHLORIDE 0.9 % IV SOLN: 500.0000 mL | INTRAVENOUS | Status: DC

## 2017-05-08 NOTE — Progress Notes (Signed)
Pt's states no medical or surgical changes since previsit or office visit. 

## 2017-05-08 NOTE — Patient Instructions (Addendum)
YOU HAD AN ENDOSCOPIC PROCEDURE TODAY AT THE [facility]:   Refer to the procedure report that was given to you for any specific questions about what was found during the examination.  If the procedure report does not answer your questions, please call your gastroenterologist to clarify.  If you requested that your care partner not be given the details of your procedure findings, then the procedure report has been included in a sealed envelope for you to review at your convenience later.  YOU SHOULD EXPECT: Some feelings of bloating in the abdomen. Passage of more gas than usual.  Walking can help get rid of the air that was put into your GI tract during the procedure and reduce the bloating. If you had a lower endoscopy (such as a colonoscopy or flexible sigmoidoscopy) you may notice spotting of blood in your stool or on the toilet paper. If you underwent a bowel prep for your procedure, you may not have a normal bowel movement for a few days.  Please Note:  You might notice some irritation and congestion in your nose or some drainage.  This is from the oxygen used during your procedure.  There is no need for concern and it should clear up in a day or so.  SYMPTOMS TO REPORT IMMEDIATELY:    Following upper endoscopy (EGD)  Vomiting of blood or coffee ground material  New chest pain or pain under the shoulder blades  Painful or persistently difficult swallowing  New shortness of breath  Fever of 100F or higher  Black, tarry-looking stools  For urgent or emergent issues, a gastroenterologist can be reached at any hour by calling [phone]. Please read GERD handout given to you by your recovery nurse.   DIET:  We do recommend a small meal at first, but then you may proceed to your regular diet.  Drink plenty of fluids but you should avoid alcoholic beverages for 24 hours.  ACTIVITY:  You should plan to take it easy for the rest of today and you should NOT DRIVE or use heavy  machinery until tomorrow (because of the sedation medicines used during the test).    FOLLOW UP: Our staff will call the number listed on your records the next business day following your procedure to check on you and address any questions or concerns that you may have regarding the information given to you following your procedure. If we do not reach you, we will leave a message.  However, if you are feeling well and you are not experiencing any problems, there is no need to return our call.  We will assume that you have returned to your regular daily activities without incident.  If any biopsies were taken you will be contacted by phone or by letter within the next 1-3 weeks.  Please call us at [phone] if you have not heard about the biopsies in 3 weeks.    SIGNATURES/CONFIDENTIALITY: You and/or your care partner have signed paperwork which will be entered into your electronic medical record.  These signatures attest to the fact that that the information above on your After Visit Summary has been reviewed and is understood.  Full responsibility of the confidentiality of this discharge information lies with you and/or your care-partner.  Thank you for letting us take care of your healthcare needs today.

## 2017-05-08 NOTE — Progress Notes (Signed)
Called to room to assist during endoscopic procedure.  Patient ID and intended procedure confirmed with present staff. Received instructions for my participation in the procedure from the performing physician.  

## 2017-05-08 NOTE — Op Note (Signed)
[facility] Patient Name: Brendan ButteDavid Simpson Procedure Date: 05/08/2017 11:04 AM MRN: [id number] Endoscopist: Brendan CooterHenry L. Myrtie Simpson , MD Age: 66 Referring MD:  Date of Birth: 05/26/1951 Gender: Male Account #: [id number] Procedure:                Upper GI endoscopy Indications:              Dysphagia, Heartburn Medicines:                Monitored Anesthesia Care Procedure:                Pre-Anesthesia Assessment:                           - Prior to the procedure, a History and Physical                            was performed, and patient medications and                            allergies were reviewed. The patient's tolerance of                            previous anesthesia was also reviewed. The risks                            and benefits of the procedure and the sedation                            options and risks were discussed with the patient.                            All questions were answered, and informed consent                            was obtained. Anticoagulants: The patient has taken                            aspirin. It was decided not to withhold this                            medication prior to the procedure. ASA Grade                            Assessment: II - A patient with mild systemic                            disease. After reviewing the risks and benefits,                            the patient was deemed in satisfactory condition to                            undergo the procedure.  After obtaining informed consent, the endoscope was                            passed under direct vision. Throughout the                            procedure, the patient's blood pressure, pulse, and                            oxygen saturations were monitored continuously. The                            Model GIF-[id number]) scope was introduced                            through the mouth, and advanced to the second part                   of duodenum. The upper GI endoscopy was                            accomplished without difficulty. The patient                            tolerated the procedure well. Scope In: Scope Out: Findings:                 The larynx was normal.                           A widely patent Schatzki ring (acquired) was found                            at the lower esophageal sphincter. The scope was                            withdrawn. Dilation was performed with a Maloney                            dilator with no resistance at 54 Fr.                           The exam of the esophagus was otherwise normal.                            Specifically, no esophagitis or Barrett's esophagus                            was seen.                           The stomach was normal.                           The cardia and gastric fundus were normal on  retroflexion.                           The examined duodenum was normal. Complications:            No immediate complications. Estimated Blood Loss:     Estimated blood loss: none. Impression:               - Normal larynx.                           - Widely patent Schatzki ring. Dilated.                           - Normal stomach.                           - Normal examined duodenum.                           - No specimens collected. Recommendation:           - Patient has a contact number available for                            emergencies. The signs and symptoms of potential                            delayed complications were discussed with the                            patient. Return to normal activities tomorrow.                            Written discharge instructions were provided to the                            patient.                           - Resume previous diet.                           - Continue present medications.                           - Follow an antireflux regimen indefinitely.  This                            patient primarily has nocturnal symptoms, and is                            taking PPI at evening meal and not eating for                            several hours before bed. Further efforts at  elevating head of bed (consider Medcline pillow or                            similar) and weight loss would most likely improve                            symptoms.                           The reported dysphagia is more likely an ancillary                            symptom of GERD than from the mild ring noted above.                           - Follow up in my office as needed. Brendan Murton L. Myrtie Neither, MD 05/08/2017 11:29:19 AM This report has been signed electronically.

## 2017-05-08 NOTE — Progress Notes (Signed)
Spontaneous respirations throughout. VSS. Resting comfortably. To PACU on room air. Report to  Sara RN. 

## 2017-05-09 ENCOUNTER — Telehealth: Payer: Self-pay | Admitting: *Deleted

## 2017-05-09 NOTE — Telephone Encounter (Signed)
  Follow up Call-  Call back number 05/08/2017  Post procedure Call Back phone  # [phone]  Permission to leave phone message Yes  Some recent data might be hidden     Patient questions:  Do you have a fever, pain , or abdominal swelling? No. Pain Score  0 *  Have you tolerated food without any problems? Yes.    Have you been able to return to your normal activities? Yes.    Do you have any questions about your discharge instructions: Diet   No. Medications  No. Follow up visit  No.  Do you have questions or concerns about your Care? No.  Actions: * If pain score is 4 or above: No action needed, pain <4.

## 2017-06-13 NOTE — Progress Notes (Signed)
[facility] PREP Progress Report   Patient Details  Name: Brendan Simpson MRN: [id number] Date of Birth: 1950/12/20 Age: 66 y.o. PCP: Helane RimaWallace, Erica, DO  Vitals:   06/13/17 1245  BP: 140/88  Pulse: 78  Resp: 18  SpO2: 98%  Weight: 187 lb (84.8 kg)         [facility] Eval - 06/13/17 1200      Measurement   Neck measurement 16.75 Inches   Waist Circumference 39.5 inches   Body fat 24.3 percent     Timed Up and Go (TUGS)   Timed Up and Go Low risk <9 seconds     Mobility and Daily Activities   I find it easy to walk up or down two or more flights of stairs. 4   I have no trouble taking out the trash. 4   I do housework such as vacuuming and dusting on my own without difficulty. 4   I can easily lift a gallon of milk (8lbs). 4   I can easily walk a mile. 4   I have no trouble reaching into high cupboards or reaching down to pick up something from the floor. 4   I do not have trouble doing out-door work such as Loss adjuster, charteredmoving the lawn, raking leaves, or gardening. 4     Mobility and Daily Activities   I feel younger than my age. 4   I feel independent. 4   I feel energetic. 4   I live an active life.  4   I feel strong. 4   I feel healthy. 4   I feel active as other people my age. 4     How fit and strong are you.   Fit and Strong Total Score 56     Past Medical History:  Diagnosis Date  . Arthritis   . GERD (gastroesophageal reflux disease)   . Gout   . Hypertension   . Hypothyroidism 02/03/2016  . Pure hypercholesterolemia 02/03/2016   Past Surgical History:  Procedure Laterality Date  . CARPAL TUNNEL RELEASE Bilateral   . CATARACT EXTRACTION, BILATERAL Bilateral   . KNEE ARTHROSCOPY Left    History  Smoking Status  . Never Smoker  Smokeless Tobacco  . Never Used     10 Wellness sessions attended.  Brendan Simpson has come in to do his post program assessment.  When he started the PREP, his goals were to lose approx 10 lbs, lower his BP, and get on a regular  exercise routine.  He had been walking about 4 miles QOD but now, in addition, he does strength training/cardio 3x/week.  He has lost the 10lbs and put a bit back on d/t the vacations he went on but understands that this is a life-long health journey.  When he started his knees bothered him and was able to leg press about 150lbs.  Now he is pressing 200lbs on the leg press and states his knees feel more stable and less achy.  He also states his BP has come down.  Brendan Simpson has become a regular member of the [employer] and plans to continue his exercise regime.  Brendan Simpson 06/13/2017, 12:47 PM

## 2017-09-18 ENCOUNTER — Encounter: Payer: Self-pay | Admitting: Family Medicine

## 2017-09-18 ENCOUNTER — Ambulatory Visit (INDEPENDENT_AMBULATORY_CARE_PROVIDER_SITE_OTHER): Payer: 59 | Admitting: Family Medicine

## 2017-09-18 VITALS — BP 122/84 | HR 81 | Temp 98.6°F | Ht 66.75 in | Wt 190.6 lb

## 2017-09-18 DIAGNOSIS — E785 Hyperlipidemia, unspecified: Secondary | ICD-10-CM

## 2017-09-18 DIAGNOSIS — I10 Essential (primary) hypertension: Secondary | ICD-10-CM | POA: Diagnosis not present

## 2017-09-18 DIAGNOSIS — E039 Hypothyroidism, unspecified: Secondary | ICD-10-CM | POA: Diagnosis not present

## 2017-09-18 MED ORDER — ATORVASTATIN CALCIUM 20 MG PO TABS: 20.0000 mg | ORAL_TABLET | Freq: Every day | ORAL | 1 refills | Status: DC

## 2017-09-22 ENCOUNTER — Encounter: Payer: Self-pay | Admitting: Family Medicine

## 2017-09-22 NOTE — Progress Notes (Signed)
Brendan Simpson is a 66 y.o. male is here for follow up.  History of Present Illness:   HPI: See Assessment and Plan section for Problem Based Charting of issues discussed today.  There are no preventive care reminders to display for this patient.   Depression screen PHQ 2/9 01/01/2017  Decreased Interest 0  Down, Depressed, Hopeless 0  PHQ - 2 Score 0   PMHx, SurgHx, SocialHx, FamHx, Medications, and Allergies were reviewed in the Visit Navigator and updated as appropriate.   Patient Active Problem List   Diagnosis Date Noted  . Rash of face 03/09/2016  . Arthritis 02/03/2016  . Esophageal reflux 02/03/2016  . Gout 02/03/2016  . Benign essential hypertension 02/03/2016  . Hearing reduced 02/03/2016  . Hypothyroidism 02/03/2016  . Memory loss 02/03/2016  . Pure hypercholesterolemia 02/03/2016   Social History   Tobacco Use  . Smoking status: Never Smoker  . Smokeless tobacco: Never Used  Substance Use Topics  . Alcohol use: Yes    Alcohol/week: 0.0 oz    Comment: occasioinal  . Drug use: No   Current Medications and Allergies:   .  allopurinol (ZYLOPRIM) 100 MG tablet, Take 1 tablet (100 mg total) by mouth daily., Disp: 90 tablet, Rfl: 2 .  amLODipine (NORVASC) 5 MG tablet, Take 1 tablet (5 mg total) by mouth daily., Disp: 90 tablet, Rfl: 2 .  aspirin EC 81 MG tablet, Take 81 mg by mouth daily., Disp: , Rfl:  .  atorvastatin (LIPITOR) 20 MG tablet, Take 1 tablet (20 mg total) by mouth daily., Disp: 30 tablet, Rfl: 1 .  azelaic acid (AZELEX) 20 % cream, Apply topically as needed. After skin is thoroughly washed and patted dry, gently but thoroughly massage a thin film of azelaic acid cream into the affected area twice daily, in the morning and evening., Disp: 50 g, Rfl: 2 .  Co-Enzyme Q-10 30 MG CAPS, Take 200 mg by mouth daily., Disp: , Rfl:  .  esomeprazole (NEXIUM) 40 MG capsule, Take 1 capsule (40 mg total) by mouth daily., Disp: 90 capsule, Rfl: 2 .  Flaxseed,  Linseed, (FLAXSEED OIL) 1000 MG CAPS, Take 1,000 mg by mouth daily., Disp: , Rfl:  .  Glucosamine-Chondroit-Vit C-Mn (GLUCOSAMINE 1500 COMPLEX PO), Take 1 tablet by mouth daily., Disp: , Rfl:  .  levothyroxine (SYNTHROID, LEVOTHROID) 88 MCG tablet, Take 1 tablet (88 mcg total) by mouth daily before breakfast., Disp: 90 tablet, Rfl: 2 .  Magnesium 400 MG TABS, Take by mouth., Disp: , Rfl:  .  Probiotic Product (PROBIOTIC DAILY PO), Take 1 tablet by mouth daily., Disp: , Rfl:   No Known Allergies   Review of Systems   Pertinent items are noted in the HPI. Otherwise, ROS is negative.  Vitals:   Vitals:   09/18/17 0945  BP: 122/84  Pulse: 81  Temp: 98.6 F (37 C)  TempSrc: Oral  SpO2: 96%  Weight: 190 lb 9.6 oz (86.5 kg)  Height: 5' 6.75" (1.695 m)     Body mass index is 30.08 kg/m.   Physical Exam:   Physical Exam  Constitutional: He is oriented to person, place, and time. He appears well-developed and well-nourished. No distress.  HENT:  Head: Normocephalic and atraumatic.  Right Ear: External ear normal.  Left Ear: External ear normal.  Nose: Nose normal.  Mouth/Throat: Oropharynx is clear and moist.  Eyes: Conjunctivae and EOM are normal. Pupils are equal, round, and reactive to light.  Neck: Normal range  of motion. Neck supple.  Cardiovascular: Normal rate, regular rhythm, normal heart sounds and intact distal pulses.  Pulmonary/Chest: Effort normal and breath sounds normal.  Abdominal: Soft. Bowel sounds are normal.  Musculoskeletal: Normal range of motion.  Neurological: He is alert and oriented to person, place, and time.  Skin: Skin is warm and dry.  Psychiatric: He has a normal mood and affect. His behavior is normal. Judgment and thought content normal.  Nursing note and vitals reviewed.  Assessment and Plan:   Diagnoses and all orders for this visit:  Hypertension, unspecified type Comments: Avoiding excessive salt intake? [x]   YES  []   NO Trying to  exercise on a regular basis? [x]   YES  []   NO Review: taking medications as instructed, no medication side effects noted, no TIAs, no chest pain on exertion, no dyspnea on exertion, no swelling of ankles.   Wt Readings from Last 3 Encounters:  09/18/17 190 lb 9.6 oz (86.5 kg)  06/13/17 187 lb (84.8 kg)  05/08/17 194 lb (88 kg)   Reports that  has never smoked.   BP Readings from Last 3 Encounters:  09/18/17 122/84  06/13/17 140/88  05/08/17 133/76   Lab Results  Component Value Date   CREATININE 1.0 09/06/2016   Well controlled.  No signs of complications, medication side effects, or red flags.  Continue current regimen.   Orders: -     Comprehensive metabolic panel; Future  Hyperlipidemia, unspecified hyperlipidemia type Comments: Is the patient taking medications without problems? [x]   YES  []   NO Does the patient complain of muscle aches?   []   YES  [x]    NO Trying to exercise on a regular basis? [x]   YES  []   NO Diet Compliance: compliant most of the time. Cardiovascular ROS: no chest pain or dyspnea on exertion.   Lipids:    Component Value Date/Time   CHOL 189 09/06/2016   TRIG 196 (A) 09/06/2016   HDL 41 09/06/2016   Orders: -     Comprehensive metabolic panel; Future -     Lipid panel; Future -     atorvastatin (LIPITOR) 20 MG tablet; Take 1 tablet (20 mg total) by mouth daily.  Hypothyroidism, unspecified type Comments: Current symptoms: none. Patient denies change in energy level, diarrhea, heat / cold intolerance, nervousness and palpitations. Symptoms have been well-controlled.  Orders: -     TSH; Future -     T4, free; Future  . Reviewed expectations re: course of current medical issues. . Discussed self-management of symptoms. . Outlined signs and symptoms indicating need for more acute intervention. . Patient verbalized understanding and all questions were answered. Marland Kitchen. Health Maintenance issues including appropriate healthy diet, exercise, and  smoking avoidance were discussed with patient. . See orders for this visit as documented in the electronic medical record. . Patient received an After Visit Summary.  Helane RimaErica Tishara Pizano, DO [place] [place] 09/22/2017  Future Appointments  Date Time Provider Department Center  09/27/2017  9:15 AM [facility]  03/20/2018  9:20 AM Helane RimaWallace, Myrtha Tonkovich, DO [facility]

## 2017-09-27 ENCOUNTER — Other Ambulatory Visit (INDEPENDENT_AMBULATORY_CARE_PROVIDER_SITE_OTHER): Payer: 59

## 2017-09-27 DIAGNOSIS — E039 Hypothyroidism, unspecified: Secondary | ICD-10-CM

## 2017-09-27 DIAGNOSIS — E785 Hyperlipidemia, unspecified: Secondary | ICD-10-CM

## 2017-09-27 DIAGNOSIS — I10 Essential (primary) hypertension: Secondary | ICD-10-CM

## 2017-09-27 LAB — TSH: TSH: 1.58 u[IU]/mL (ref 0.35–4.50)

## 2017-09-27 LAB — LIPID PANEL
Cholesterol: 174 mg/dL (ref 0–200)
HDL: 39.5 mg/dL (ref >39.00)
NonHDL: 134.24
Total CHOL/HDL Ratio: 4
Triglycerides: 248 mg/dL — ABNORMAL HIGH (ref 0.0–149.0)
VLDL: 49.6 mg/dL — ABNORMAL HIGH (ref 0.0–40.0)

## 2017-09-27 LAB — COMPREHENSIVE METABOLIC PANEL
ALT: 29 U/L (ref 0–53)
AST: 19 U/L (ref 0–37)
Albumin: 4.6 g/dL (ref 3.5–5.2)
Alkaline Phosphatase: 52 U/L (ref 39–117)
BUN: 15 mg/dL (ref 6–23)
CO2: 30 mEq/L (ref 19–32)
Calcium: 9.3 mg/dL (ref 8.4–10.5)
Chloride: 102 mEq/L (ref 96–112)
Creatinine, Ser: 1.06 mg/dL (ref 0.40–1.50)
GFR: 90.29 mL/min (ref >60.00)
Glucose, Bld: 99 mg/dL (ref 70–99)
Potassium: 4.2 mEq/L (ref 3.5–5.1)
Sodium: 138 mEq/L (ref 135–145)
Total Bilirubin: 0.4 mg/dL (ref 0.2–1.2)
Total Protein: 7.1 g/dL (ref 6.0–8.3)

## 2017-09-27 LAB — T4, FREE: Free T4: 0.57 ng/dL — ABNORMAL LOW (ref 0.60–1.60)

## 2017-09-27 LAB — LDL CHOLESTEROL, DIRECT: Direct LDL: 102 mg/dL

## 2017-10-29 ENCOUNTER — Encounter: Payer: Self-pay | Admitting: Family Medicine

## 2017-10-29 ENCOUNTER — Ambulatory Visit (INDEPENDENT_AMBULATORY_CARE_PROVIDER_SITE_OTHER): Payer: 59 | Admitting: Family Medicine

## 2017-10-29 VITALS — BP 144/88 | HR 92 | Temp 98.0°F | Ht 66.75 in | Wt 197.2 lb

## 2017-10-29 DIAGNOSIS — E7800 Pure hypercholesterolemia, unspecified: Secondary | ICD-10-CM

## 2017-10-29 DIAGNOSIS — R739 Hyperglycemia, unspecified: Secondary | ICD-10-CM | POA: Diagnosis not present

## 2017-10-29 DIAGNOSIS — I10 Essential (primary) hypertension: Secondary | ICD-10-CM | POA: Diagnosis not present

## 2017-10-29 LAB — POCT GLYCOSYLATED HEMOGLOBIN (HGB A1C): Hemoglobin A1C: 5.8

## 2017-10-29 MED ORDER — AMLODIPINE BESYLATE 10 MG PO TABS: 10.0000 mg | ORAL_TABLET | Freq: Every day | ORAL | 2 refills | Status: DC

## 2017-10-29 MED ORDER — ATORVASTATIN CALCIUM 40 MG PO TABS: 40.0000 mg | ORAL_TABLET | Freq: Every day | ORAL | 3 refills | Status: DC

## 2017-10-29 NOTE — Progress Notes (Signed)
Brendan Simpson is a 67 y.o. male is here for follow up.  History of Present Illness:   Britt Bottom CMA acting as scribe for Dr. Earlene Plater.  HPI: Patient comes in today for follow up on his blood pressure. Patient states that he has been taking his blood pressure medication as directed. 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   There are no preventive care reminders to display for this patient.   Depression screen PHQ 2/9 01/01/2017  Decreased Interest 0  Down, Depressed, Hopeless 0  PHQ - 2 Score 0   PMHx, SurgHx, SocialHx, FamHx, Medications, and Allergies were reviewed in the Visit Navigator and updated as appropriate.   Patient Active Problem List   Diagnosis Date Noted  . Rash of face 03/09/2016  . Arthritis 02/03/2016  . Esophageal reflux 02/03/2016  . Gout 02/03/2016  . Benign essential hypertension 02/03/2016  . Hearing reduced 02/03/2016  . Hypothyroidism 02/03/2016  . Memory loss 02/03/2016  . Pure hypercholesterolemia 02/03/2016   Social History   Tobacco Use  . Smoking status: Never Smoker  . Smokeless tobacco: Never Used  Substance Use Topics  . Alcohol use: Yes    Alcohol/week: 0.0 oz    Comment: occasioinal  . Drug use: No   Current Medications and Allergies:   .  allopurinol (ZYLOPRIM) 100 MG tablet, Take 1 tablet (100 mg total) by mouth daily., Disp: 90 tablet, Rfl: 2 .  amLODipine (NORVASC) 5 MG tablet, Take 1 tablet (5 mg total) by mouth daily., Disp: 90 tablet, Rfl: 2 .  aspirin EC 81 MG tablet, Take 81 mg by mouth daily., Disp: , Rfl:  .  atorvastatin (LIPITOR) 20 MG tablet, Take 1 tablet (20 mg total) by mouth daily., Disp: 30 tablet, Rfl: 1 .  azelaic acid (AZELEX) 20 % cream, Apply topically as needed. After skin is thoroughly washed and patted dry, gently but thoroughly massage a thin film of azelaic acid cream into the affected area twice daily, in the morning and  evening., Disp: 50 g, Rfl: 2 .  Co-Enzyme Q-10 30 MG CAPS, Take 200 mg by mouth daily., Disp: , Rfl:  .  esomeprazole (NEXIUM) 40 MG capsule, Take 1 capsule (40 mg total) by mouth daily., Disp: 90 capsule, Rfl: 2 .  Flaxseed, Linseed, (FLAXSEED OIL) 1000 MG CAPS, Take 1,000 mg by mouth daily., Disp: , Rfl:  .  Glucosamine-Chondroit-Vit C-Mn (GLUCOSAMINE 1500 COMPLEX PO), Take 1 tablet by mouth daily., Disp: , Rfl:  .  levothyroxine (SYNTHROID, LEVOTHROID) 88 MCG tablet, Take 1 tablet (88 mcg total) by mouth daily before breakfast., Disp: 90 tablet, Rfl: 2 .  Magnesium 400 MG TABS, Take by mouth., Disp: , Rfl:  .  Probiotic Product (PROBIOTIC DAILY PO), Take 1 tablet by mouth daily., Disp: , Rfl:   No Known Allergies   Review of Systems   Pertinent items are noted in the HPI. Otherwise, ROS is negative.  Vitals:   Vitals:   10/29/17 1018  Pulse: 92  Temp: 98 F (36.7 C)  TempSrc: Oral  SpO2: 99%  Weight: 197 lb 3.2 oz (89.4 kg)  Height: 5' 6.75" (1.695 m)     Body mass index is 31.12 kg/m.  Physical Exam:   Physical Exam  Constitutional: He is oriented to person, place, and time. He appears well-developed and well-nourished. No distress.  HENT:  Head: Normocephalic and atraumatic.  Right  Ear: External ear normal.  Left Ear: External ear normal.  Nose: Nose normal.  Mouth/Throat: Oropharynx is clear and moist.  Eyes: Conjunctivae and EOM are normal. Pupils are equal, round, and reactive to light.  Neck: Normal range of motion. Neck supple.  Cardiovascular: Normal rate, regular rhythm, normal heart sounds and intact distal pulses.  Pulmonary/Chest: Effort normal and breath sounds normal.  Abdominal: Soft. Bowel sounds are normal.  Musculoskeletal: Normal range of motion.  Neurological: He is alert and oriented to person, place, and time.  Skin: Skin is warm and dry.  Psychiatric: He has a normal mood and affect. His behavior is normal. Judgment and thought content normal.   Nursing note and vitals reviewed.   Assessment and Plan:   1. Hyperglycemia  Lab Results  Component Value Date   HGBA1C 5.8 10/29/2017   - POCT glycosylated hemoglobin (Hb A1C)  2. Essential hypertension Avoiding excessive salt intake? [x]   YES  []   NO Trying to exercise on a regular basis? [x]   YES  []   NO Review: taking medications as instructed, no medication side effects noted, no TIAs, no chest pain on exertion, no dyspnea on exertion, no swelling of ankles.  Smoker: No.  Wt Readings from Last 3 Encounters:  10/29/17 197 lb 3.2 oz (89.4 kg)  09/18/17 190 lb 9.6 oz (86.5 kg)  06/13/17 187 lb (84.8 kg)   BP Readings from Last 3 Encounters:  10/29/17 (!) 144/88  09/18/17 122/84  06/13/17 140/88   Lab Results  Component Value Date   CREATININE 1.06 09/27/2017   - amLODipine (NORVASC) 10 MG tablet; Take 1 tablet (10 mg total) by mouth daily.  Dispense: 90 tablet; Refill: 2  3. Pure hypercholesterolemia Is the patient taking medications without problems? [x]   YES  []   NO Does the patient complain of muscle aches?   []   YES  [x]    NO Trying to exercise on a regular basis? [x]   YES  []   NO Diet Compliance: noncompliant some of the time. Cardiovascular ROS: no chest pain or dyspnea on exertion.   Lipids:    Component Value Date/Time   CHOL 174 09/27/2017 0918   TRIG 248.0 (H) 09/27/2017 0918   HDL 39.50 09/27/2017 0918   LDLDIRECT 102.0 09/27/2017 0918   VLDL 49.6 (H) 09/27/2017 0918   CHOLHDL 4 09/27/2017 0918   - atorvastatin (LIPITOR) 40 MG tablet; Take 1 tablet (40 mg total) by mouth daily.  Dispense: 90 tablet; Refill: 3   . Reviewed expectations re: course of current medical issues. . Discussed self-management of symptoms. . Outlined signs and symptoms indicating need for more acute intervention. . Patient verbalized understanding and all questions were answered. Marland Kitchen. Health Maintenance issues including appropriate healthy diet, exercise, and smoking  avoidance were discussed with patient. . See orders for this visit as documented in the electronic medical record. . Patient received an After Visit Summary.  CMA served as Neurosurgeonscribe during this visit. History, Physical, and Plan performed by medical provider. The above documentation has been reviewed and is accurate and complete. Helane RimaErica Gennesis Hogland, D.O.   Helane RimaErica Chelsye Suhre, DO [place] 10/29/2017

## 2017-10-29 NOTE — Patient Instructions (Signed)
High-density lipoprotein (HDL) cholesterol is known as the "good" cholesterol because it picks up excess cholesterol in your blood and takes it back to your liver where it's broken down and removed from your body. Higher levels of HDL cholesterol are associated with a lower risk of heart disease.  Ideally, your HDL goal is > 60.   Ways to increase HDL:    Try taking an OTC Niacin supplement (B3). Start at a low dose and work your way up as it can cause flushing. If you experience flushing, try taking a baby ASA about 30 minutes prior to taking the niacin or taking the niacin after a meal.    Exercise.  Magnesium oxide is used typically 400-600 mg/day. It can cause diarrhea in some pts. Magnesium glycinate is better tolerated by GI system but it is hard to find (may have to look up online or health food store).

## 2017-11-26 ENCOUNTER — Other Ambulatory Visit: Payer: Self-pay | Admitting: Family Medicine

## 2018-01-30 DIAGNOSIS — M9902 Segmental and somatic dysfunction of thoracic region: Secondary | ICD-10-CM | POA: Diagnosis not present

## 2018-01-30 DIAGNOSIS — J3089 Other allergic rhinitis: Secondary | ICD-10-CM | POA: Diagnosis not present

## 2018-01-30 DIAGNOSIS — M5432 Sciatica, left side: Secondary | ICD-10-CM | POA: Diagnosis not present

## 2018-01-30 DIAGNOSIS — M9901 Segmental and somatic dysfunction of cervical region: Secondary | ICD-10-CM | POA: Diagnosis not present

## 2018-01-30 DIAGNOSIS — M62830 Muscle spasm of back: Secondary | ICD-10-CM | POA: Diagnosis not present

## 2018-01-30 DIAGNOSIS — M9903 Segmental and somatic dysfunction of lumbar region: Secondary | ICD-10-CM | POA: Diagnosis not present

## 2018-01-30 DIAGNOSIS — J301 Allergic rhinitis due to pollen: Secondary | ICD-10-CM | POA: Diagnosis not present

## 2018-01-30 DIAGNOSIS — M545 Low back pain: Secondary | ICD-10-CM | POA: Diagnosis not present

## 2018-02-20 DIAGNOSIS — M545 Low back pain: Secondary | ICD-10-CM | POA: Diagnosis not present

## 2018-02-20 DIAGNOSIS — M5432 Sciatica, left side: Secondary | ICD-10-CM | POA: Diagnosis not present

## 2018-02-20 DIAGNOSIS — M9902 Segmental and somatic dysfunction of thoracic region: Secondary | ICD-10-CM | POA: Diagnosis not present

## 2018-02-20 DIAGNOSIS — M9901 Segmental and somatic dysfunction of cervical region: Secondary | ICD-10-CM | POA: Diagnosis not present

## 2018-02-20 DIAGNOSIS — M9903 Segmental and somatic dysfunction of lumbar region: Secondary | ICD-10-CM | POA: Diagnosis not present

## 2018-02-20 DIAGNOSIS — M62830 Muscle spasm of back: Secondary | ICD-10-CM | POA: Diagnosis not present

## 2018-02-26 DIAGNOSIS — J301 Allergic rhinitis due to pollen: Secondary | ICD-10-CM | POA: Diagnosis not present

## 2018-02-26 DIAGNOSIS — J3089 Other allergic rhinitis: Secondary | ICD-10-CM | POA: Diagnosis not present

## 2018-03-18 DIAGNOSIS — M545 Low back pain: Secondary | ICD-10-CM | POA: Diagnosis not present

## 2018-03-18 DIAGNOSIS — M9902 Segmental and somatic dysfunction of thoracic region: Secondary | ICD-10-CM | POA: Diagnosis not present

## 2018-03-18 DIAGNOSIS — M62830 Muscle spasm of back: Secondary | ICD-10-CM | POA: Diagnosis not present

## 2018-03-18 DIAGNOSIS — M9901 Segmental and somatic dysfunction of cervical region: Secondary | ICD-10-CM | POA: Diagnosis not present

## 2018-03-18 DIAGNOSIS — M9903 Segmental and somatic dysfunction of lumbar region: Secondary | ICD-10-CM | POA: Diagnosis not present

## 2018-03-18 DIAGNOSIS — M5432 Sciatica, left side: Secondary | ICD-10-CM | POA: Diagnosis not present

## 2018-03-20 ENCOUNTER — Encounter: Payer: Self-pay | Admitting: Family Medicine

## 2018-03-20 ENCOUNTER — Encounter: Payer: Self-pay | Admitting: Gastroenterology

## 2018-03-20 ENCOUNTER — Ambulatory Visit (INDEPENDENT_AMBULATORY_CARE_PROVIDER_SITE_OTHER): Payer: Medicare Other | Admitting: Family Medicine

## 2018-03-20 VITALS — BP 138/86 | HR 67 | Temp 98.3°F | Ht 66.75 in | Wt 191.2 lb

## 2018-03-20 DIAGNOSIS — Z23 Encounter for immunization: Secondary | ICD-10-CM | POA: Diagnosis not present

## 2018-03-20 DIAGNOSIS — E7800 Pure hypercholesterolemia, unspecified: Secondary | ICD-10-CM

## 2018-03-20 DIAGNOSIS — Z0000 Encounter for general adult medical examination without abnormal findings: Secondary | ICD-10-CM | POA: Diagnosis not present

## 2018-03-20 DIAGNOSIS — G8929 Other chronic pain: Secondary | ICD-10-CM | POA: Insufficient documentation

## 2018-03-20 DIAGNOSIS — Z1211 Encounter for screening for malignant neoplasm of colon: Secondary | ICD-10-CM | POA: Diagnosis not present

## 2018-03-20 DIAGNOSIS — M5442 Lumbago with sciatica, left side: Secondary | ICD-10-CM

## 2018-03-20 DIAGNOSIS — R739 Hyperglycemia, unspecified: Secondary | ICD-10-CM | POA: Diagnosis not present

## 2018-03-20 DIAGNOSIS — I10 Essential (primary) hypertension: Secondary | ICD-10-CM | POA: Diagnosis not present

## 2018-03-20 DIAGNOSIS — M1A09X0 Idiopathic chronic gout, multiple sites, without tophus (tophi): Secondary | ICD-10-CM | POA: Diagnosis not present

## 2018-03-20 DIAGNOSIS — E039 Hypothyroidism, unspecified: Secondary | ICD-10-CM | POA: Diagnosis not present

## 2018-03-20 DIAGNOSIS — R351 Nocturia: Secondary | ICD-10-CM

## 2018-03-20 DIAGNOSIS — Z79899 Other long term (current) drug therapy: Secondary | ICD-10-CM

## 2018-03-20 DIAGNOSIS — M791 Myalgia, unspecified site: Secondary | ICD-10-CM | POA: Diagnosis not present

## 2018-03-20 LAB — LIPID PANEL
Cholesterol: 178 mg/dL (ref 0–200)
HDL: 48 mg/dL (ref >39.00)
LDL Cholesterol: 113 mg/dL — ABNORMAL HIGH (ref 0–99)
NonHDL: 130.13
Total CHOL/HDL Ratio: 4
Triglycerides: 87 mg/dL (ref 0.0–149.0)
VLDL: 17.4 mg/dL (ref 0.0–40.0)

## 2018-03-20 LAB — COMPREHENSIVE METABOLIC PANEL
ALT: 37 U/L (ref 0–53)
AST: 22 U/L (ref 0–37)
Albumin: 4.8 g/dL (ref 3.5–5.2)
Alkaline Phosphatase: 61 U/L (ref 39–117)
BUN: 10 mg/dL (ref 6–23)
CO2: 31 mEq/L (ref 19–32)
Calcium: 9.6 mg/dL (ref 8.4–10.5)
Chloride: 102 mEq/L (ref 96–112)
Creatinine, Ser: 1.04 mg/dL (ref 0.40–1.50)
GFR: 92.15 mL/min (ref >60.00)
Glucose, Bld: 110 mg/dL — ABNORMAL HIGH (ref 70–99)
Potassium: 4.5 mEq/L (ref 3.5–5.1)
Sodium: 140 mEq/L (ref 135–145)
Total Bilirubin: 0.5 mg/dL (ref 0.2–1.2)
Total Protein: 7.5 g/dL (ref 6.0–8.3)

## 2018-03-20 LAB — CBC WITH DIFFERENTIAL/PLATELET
Basophils Absolute: 0.1 10*3/uL (ref 0.0–0.1)
Basophils Relative: 1 % (ref 0.0–3.0)
Eosinophils Absolute: 0.2 10*3/uL (ref 0.0–0.7)
Eosinophils Relative: 3.9 % (ref 0.0–5.0)
HCT: 46.3 % (ref 39.0–52.0)
Hemoglobin: 15.8 g/dL (ref 13.0–17.0)
Lymphocytes Relative: 35.8 % (ref 12.0–46.0)
Lymphs Abs: 2.2 10*3/uL (ref 0.7–4.0)
MCHC: 34.2 g/dL (ref 30.0–36.0)
MCV: 89.1 fl (ref 78.0–100.0)
Monocytes Absolute: 0.6 10*3/uL (ref 0.1–1.0)
Monocytes Relative: 9.5 % (ref 3.0–12.0)
Neutro Abs: 3.1 10*3/uL (ref 1.4–7.7)
Neutrophils Relative %: 49.8 % (ref 43.0–77.0)
Platelets: 207 10*3/uL (ref 150.0–400.0)
RBC: 5.2 Mil/uL (ref 4.22–5.81)
RDW: 13.2 % (ref 11.5–15.5)
WBC: 6.3 10*3/uL (ref 4.0–10.5)

## 2018-03-20 LAB — PSA: PSA: 1.51 ng/mL (ref 0.10–4.00)

## 2018-03-20 LAB — HEMOGLOBIN A1C: Hgb A1c MFr Bld: 6.3 % (ref 4.6–6.5)

## 2018-03-20 LAB — CK: Total CK: 394 U/L — ABNORMAL HIGH (ref 7–232)

## 2018-03-20 LAB — T4, FREE: Free T4: 0.72 ng/dL (ref 0.60–1.60)

## 2018-03-20 LAB — URIC ACID: Uric Acid, Serum: 5.8 mg/dL (ref 4.0–7.8)

## 2018-03-20 LAB — TSH: TSH: 1.71 u[IU]/mL (ref 0.35–4.50)

## 2018-03-20 NOTE — Progress Notes (Signed)
Subjective:    Brendan Simpson is a 67 y.o. male who presents for Medicare Initial Preventive Examination.  Preventive Screening-Counseling & Management  Tobacco Social History   Tobacco Use  Smoking Status Never Smoker  Smokeless Tobacco Never Used     Current Problems (verified) Patient Active Problem List   Diagnosis Date Noted  . Rash of face 03/09/2016  . Arthritis 02/03/2016  . Esophageal reflux 02/03/2016  . Gout 02/03/2016  . Benign essential hypertension 02/03/2016  . Hearing reduced 02/03/2016  . Hypothyroidism 02/03/2016  . Memory loss 02/03/2016  . Pure hypercholesterolemia 02/03/2016    Medications Prior to Visit Current Outpatient Medications on File Prior to Visit  Medication Sig Dispense Refill  . allopurinol (ZYLOPRIM) 100 MG tablet Take 1 tablet (100 mg total) by mouth daily. 90 tablet 2  . amLODipine (NORVASC) 10 MG tablet Take 1 tablet (10 mg total) by mouth daily. 90 tablet 2  . aspirin EC 81 MG tablet Take 81 mg by mouth daily.    Marland Kitchen atorvastatin (LIPITOR) 40 MG tablet Take 1 tablet (40 mg total) by mouth daily. 90 tablet 3  . azelaic acid (AZELEX) 20 % cream Apply topically as needed. After skin is thoroughly washed and patted dry, gently but thoroughly massage a thin film of azelaic acid cream into the affected area twice daily, in the morning and evening. 50 g 2  . Co-Enzyme Q-10 30 MG CAPS Take 200 mg by mouth daily.    Marland Kitchen esomeprazole (NEXIUM) 40 MG capsule TAKE ONE CAPSULE BY MOUTH EVERY DAY 90 capsule 2  . Glucosamine-Chondroit-Vit C-Mn (GLUCOSAMINE 1500 COMPLEX PO) Take 1 tablet by mouth daily.    Marland Kitchen levothyroxine (SYNTHROID, LEVOTHROID) 88 MCG tablet TAKE 1 TABLET (88 MCG TOTAL) BY MOUTH DAILY BEFORE BREAKFAST. 90 tablet 2  . Magnesium 400 MG TABS Take by mouth.    . Probiotic Product (PROBIOTIC DAILY PO) Take 1 tablet by mouth daily.     Current Facility-Administered Medications on File Prior to Visit  Medication Dose Route Frequency Provider  Last Rate Last Dose  . 0.9 %  sodium chloride infusion  500 mL Intravenous Continuous Danis, Starr Lake III, MD        Current Medications (verified) Current Outpatient Medications  Medication Sig Dispense Refill  . allopurinol (ZYLOPRIM) 100 MG tablet Take 1 tablet (100 mg total) by mouth daily. 90 tablet 2  . amLODipine (NORVASC) 10 MG tablet Take 1 tablet (10 mg total) by mouth daily. 90 tablet 2  . aspirin EC 81 MG tablet Take 81 mg by mouth daily.    Marland Kitchen atorvastatin (LIPITOR) 40 MG tablet Take 1 tablet (40 mg total) by mouth daily. 90 tablet 3  . azelaic acid (AZELEX) 20 % cream Apply topically as needed. After skin is thoroughly washed and patted dry, gently but thoroughly massage a thin film of azelaic acid cream into the affected area twice daily, in the morning and evening. 50 g 2  . Co-Enzyme Q-10 30 MG CAPS Take 200 mg by mouth daily.    Marland Kitchen esomeprazole (NEXIUM) 40 MG capsule TAKE ONE CAPSULE BY MOUTH EVERY DAY 90 capsule 2  . Glucosamine-Chondroit-Vit C-Mn (GLUCOSAMINE 1500 COMPLEX PO) Take 1 tablet by mouth daily.    Marland Kitchen levothyroxine (SYNTHROID, LEVOTHROID) 88 MCG tablet TAKE 1 TABLET (88 MCG TOTAL) BY MOUTH DAILY BEFORE BREAKFAST. 90 tablet 2  . Magnesium 400 MG TABS Take by mouth.    . Probiotic Product (PROBIOTIC DAILY PO) Take 1 tablet  by mouth daily.     No current facility-administered medications for this visit.      Allergies (verified) Patient has no known allergies.   PAST HISTORY  Past Medical History:  Diagnosis Date  . Arthritis   . GERD (gastroesophageal reflux disease)   . Gout   . Hyperlipidemia   . Hypertension   . Hypothyroidism 02/03/2016  . Uses hearing aid     Past Surgical History:  Procedure Laterality Date  . CARPAL TUNNEL RELEASE Bilateral   . CATARACT EXTRACTION, BILATERAL Bilateral   . KNEE ARTHROSCOPY Left     Family History  Problem Relation Age of Onset  . Breast cancer Mother   . Heart disease Father   . Diabetes Sister   .  Diabetes Brother     Social History   Tobacco Use  . Smoking status: Never Smoker  . Smokeless tobacco: Never Used  Substance Use Topics  . Alcohol use: Yes    Alcohol/week: 0.0 oz    Comment: occasioinal     Are there smokers in your home (other than you)? No Opioid use issues? No  Risk Factors Current exercise habits: Gym/ health club routine includes [employer] 4/week.  Dietary issues discussed: "I try"   Cardiac risk factors: advanced age (older than 3055 for men, 5965 for women), dyslipidemia, hypertension and male gender.  Depression Screen (Note: if answer to either of the following is "Yes", a more complete depression screening is indicated)   Over the past 2 weeks, have you felt down, depressed or hopeless? No  Over the past 2 weeks, have you felt little interest or pleasure in doing things? No  Have you lost interest or pleasure in daily life? No  Do you often feel hopeless? No  Do you cry easily over simple problems? No  Activities of Daily Living In your present state of health, do you have any difficulty performing the following activities?:  Driving? No Managing money?  No Feeding yourself? No Getting from bed to chair? No Climbing a flight of stairs? No Preparing food and eating? No Bathing or showering? No Getting dressed: No Getting to the toilet? No Using the toilet: No Moving around from place to place: No In the past year have you fallen or had a near fall?: No   Are you sexually active?  Yes  Do you have more than one partner?  No  Hearing Difficulties:  Do you often ask people to speak up or repeat themselves? No Do you experience ringing or noises in your ears? No Do you have difficulty understanding soft or whispered voices? No   Do you feel that you have a problem with memory? No  Do you often misplace items? No  Do you feel safe at home?  Yes  Cognitive Testing  Alert? Yes  Normal Appearance? Yes  Oriented to person? Yes  Place? Yes   Time?  Yes  Recall of three objects? Yes  Can perform simple calculations? Yes  Displays appropriate judgment? Yes  Can read the correct time from a watch face? Yes   Advanced Directives have been discussed with the patient? Yes  List the Names of Other Physician/Practitioners you currently use: Patient Care Team: Helane RimaWallace, Elliott Lasecki, DO as PCP - General (Family Medicine)  Indicate any recent Medical Services you may have received from other than [facility] providers in the past year (date may be approximate).  Immunization History  Administered Date(s) Administered  . Influenza,inj,Quad PF,6+ Mos 07/03/2015, 06/05/2017, 06/05/2017, 07/19/2017  .  Pneumococcal Conjugate-13 09/16/2015  . Tdap 09/19/2006, 03/19/2017  . Zoster 03/05/2013    Screening Tests Health Maintenance  Topic Date Due  . COLONOSCOPY  12/03/2017  . PNA vac Low Risk Adult (2 of 2 - PPSV23) 02/20/2018  . INFLUENZA VACCINE  05/02/2018  . TETANUS/TDAP  03/20/2027  . Hepatitis C Screening  Completed  . HIV Screening  Completed    All answers were reviewed with the patient and necessary referrals were made:  Helane Rima, DO   03/20/2018   History reviewed: allergies, current medications, past family history, past medical history, past social history, past surgical history and problem list  Review of Systems Pertinent items noted in HPI and remainder of comprehensive ROS otherwise negative.    Objective:     Vision by Snellen chart: right eye:20/16, left eye:20/25  Body mass index is 30.17 kg/m. BP 138/86   Pulse 67   Temp 98.3 F (36.8 C) (Oral)   Ht 5' 6.75" (1.695 m)   Wt 191 lb 3.2 oz (86.7 kg)   SpO2 97%   BMI 30.17 kg/m   BP 138/86   Pulse 67   Temp 98.3 F (36.8 C) (Oral)   Ht 5' 6.75" (1.695 m)   Wt 191 lb 3.2 oz (86.7 kg)   SpO2 97%   BMI 30.17 kg/m   General Appearance:    Alert, cooperative, no distress, appears stated age  Head:    Normocephalic, without obvious abnormality, atraumatic   Eyes:    PERRL, conjunctiva/corneas clear, EOM's intact, fundi    benign, both eyes       Ears:    Normal TM's and external ear canals, both ears  Nose:   Nares normal, septum midline, mucosa normal, no drainage    or sinus tenderness  Throat:   Lips, mucosa, and tongue normal; teeth and gums normal  Neck:   Supple, symmetrical, trachea midline, no adenopathy;       thyroid:  No enlargement/tenderness/nodules; no carotid   bruit or JVD  Back:     Symmetric, no curvature, ROM normal, no CVA tenderness  Lungs:     Clear to auscultation bilaterally, respirations unlabored  Chest wall:    No tenderness or deformity  Heart:    Regular rate and rhythm, S1 and S2 normal, no murmur, rub   or gallop  Abdomen:     Soft, non-tender, bowel sounds active all four quadrants,    no masses, no organomegaly  Extremities:   Extremities normal, atraumatic, no cyanosis or edema  Pulses:   2+ and symmetric all extremities  Skin:   Skin color, texture, turgor normal, no rashes or lesions  Lymph nodes:   Cervical, supraclavicular, and axillary nodes normal  Neurologic:   CNII-XII intact. Normal strength, sensation and reflexes      throughout   EKG: normal EKG, normal sinus rhythm.    Assessment:     1. Welcome to Medicare preventive visit   2. Screening for colon cancer   3. Medication management   4. Acquired hypothyroidism   5. Benign essential hypertension   6. Pure hypercholesterolemia   7. Hyperglycemia   8. Nocturia   9. Myalgia   10. Idiopathic chronic gout of multiple sites without tophus         Plan:     During the course of the visit the patient was educated and counseled about appropriate screening and preventive services including:   Orders Placed This Encounter  Procedures  . CBC  with Differential/Platelet  . Comprehensive metabolic panel  . Lipid panel  . TSH  . T4, free  . Hemoglobin A1c  . PSA  . CK  . Uric acid  . Ambulatory referral to Gastroenterology  . EKG  12-Lead    Diet review for nutrition referral? Not Indicated   Patient Instructions (the written plan) was given to the patient.  Medicare Attestation I have personally reviewed: The patient's medical and social history Their use of alcohol, tobacco or illicit drugs Their current medications and supplements The patient's functional ability including ADLs,fall risks, home safety risks, cognitive, and hearing and visual impairment Diet and physical activities Evidence for depression or mood disorders  The patient's weight, height, BMI, and visual acuity have been recorded in the chart.  I have made referrals, counseling, and provided education to the patient based on review of the above and I have provided the patient with a written personalized care plan for preventive services.    Helane Rima, DO   03/20/2018

## 2018-03-24 ENCOUNTER — Encounter: Payer: Self-pay | Admitting: Family Medicine

## 2018-03-24 DIAGNOSIS — M791 Myalgia, unspecified site: Secondary | ICD-10-CM | POA: Insufficient documentation

## 2018-03-24 DIAGNOSIS — T466X5A Adverse effect of antihyperlipidemic and antiarteriosclerotic drugs, initial encounter: Secondary | ICD-10-CM | POA: Insufficient documentation

## 2018-03-25 DIAGNOSIS — H40003 Preglaucoma, unspecified, bilateral: Secondary | ICD-10-CM | POA: Diagnosis not present

## 2018-03-28 ENCOUNTER — Other Ambulatory Visit: Payer: Self-pay

## 2018-03-28 DIAGNOSIS — E7800 Pure hypercholesterolemia, unspecified: Secondary | ICD-10-CM

## 2018-03-28 MED ORDER — ATORVASTATIN CALCIUM 20 MG PO TABS
20.0000 mg | ORAL_TABLET | Freq: Every day | ORAL | 3 refills | Status: DC
Start: 2018-03-28 — End: 2018-06-25

## 2018-04-02 DIAGNOSIS — J3089 Other allergic rhinitis: Secondary | ICD-10-CM | POA: Diagnosis not present

## 2018-04-02 DIAGNOSIS — J301 Allergic rhinitis due to pollen: Secondary | ICD-10-CM | POA: Diagnosis not present

## 2018-04-10 DIAGNOSIS — M9903 Segmental and somatic dysfunction of lumbar region: Secondary | ICD-10-CM | POA: Diagnosis not present

## 2018-04-10 DIAGNOSIS — J3089 Other allergic rhinitis: Secondary | ICD-10-CM | POA: Diagnosis not present

## 2018-04-10 DIAGNOSIS — M62830 Muscle spasm of back: Secondary | ICD-10-CM | POA: Diagnosis not present

## 2018-04-10 DIAGNOSIS — M545 Low back pain: Secondary | ICD-10-CM | POA: Diagnosis not present

## 2018-04-10 DIAGNOSIS — M9901 Segmental and somatic dysfunction of cervical region: Secondary | ICD-10-CM | POA: Diagnosis not present

## 2018-04-10 DIAGNOSIS — M9902 Segmental and somatic dysfunction of thoracic region: Secondary | ICD-10-CM | POA: Diagnosis not present

## 2018-04-10 DIAGNOSIS — J301 Allergic rhinitis due to pollen: Secondary | ICD-10-CM | POA: Diagnosis not present

## 2018-04-10 DIAGNOSIS — M5432 Sciatica, left side: Secondary | ICD-10-CM | POA: Diagnosis not present

## 2018-04-17 DIAGNOSIS — J3089 Other allergic rhinitis: Secondary | ICD-10-CM | POA: Diagnosis not present

## 2018-04-17 DIAGNOSIS — J301 Allergic rhinitis due to pollen: Secondary | ICD-10-CM | POA: Diagnosis not present

## 2018-04-24 DIAGNOSIS — M5432 Sciatica, left side: Secondary | ICD-10-CM | POA: Diagnosis not present

## 2018-04-24 DIAGNOSIS — M9903 Segmental and somatic dysfunction of lumbar region: Secondary | ICD-10-CM | POA: Diagnosis not present

## 2018-04-24 DIAGNOSIS — M545 Low back pain: Secondary | ICD-10-CM | POA: Diagnosis not present

## 2018-04-24 DIAGNOSIS — M9902 Segmental and somatic dysfunction of thoracic region: Secondary | ICD-10-CM | POA: Diagnosis not present

## 2018-04-24 DIAGNOSIS — M62830 Muscle spasm of back: Secondary | ICD-10-CM | POA: Diagnosis not present

## 2018-04-24 DIAGNOSIS — M9901 Segmental and somatic dysfunction of cervical region: Secondary | ICD-10-CM | POA: Diagnosis not present

## 2018-04-25 DIAGNOSIS — J3089 Other allergic rhinitis: Secondary | ICD-10-CM | POA: Diagnosis not present

## 2018-04-25 DIAGNOSIS — J301 Allergic rhinitis due to pollen: Secondary | ICD-10-CM | POA: Diagnosis not present

## 2018-04-29 ENCOUNTER — Ambulatory Visit (AMBULATORY_SURGERY_CENTER): Payer: Self-pay

## 2018-04-29 VITALS — Ht 67.0 in | Wt 202.2 lb

## 2018-04-29 DIAGNOSIS — Z1211 Encounter for screening for malignant neoplasm of colon: Secondary | ICD-10-CM

## 2018-04-29 MED ORDER — PEG-KCL-NACL-NASULF-NA ASC-C 140 G PO SOLR: 1.0000 | Freq: Once | ORAL | 0 refills | Status: AC

## 2018-04-29 NOTE — Progress Notes (Signed)
Denies allergies to eggs or soy products. Denies complication of anesthesia or sedation. Denies use of weight loss medication. Denies use of O2.   Emmi instructions declined.  

## 2018-05-01 DIAGNOSIS — J3089 Other allergic rhinitis: Secondary | ICD-10-CM | POA: Diagnosis not present

## 2018-05-01 DIAGNOSIS — J301 Allergic rhinitis due to pollen: Secondary | ICD-10-CM | POA: Diagnosis not present

## 2018-05-08 DIAGNOSIS — J3089 Other allergic rhinitis: Secondary | ICD-10-CM | POA: Diagnosis not present

## 2018-05-08 DIAGNOSIS — J301 Allergic rhinitis due to pollen: Secondary | ICD-10-CM | POA: Diagnosis not present

## 2018-05-13 ENCOUNTER — Encounter: Payer: Self-pay | Admitting: Gastroenterology

## 2018-05-13 ENCOUNTER — Ambulatory Visit (AMBULATORY_SURGERY_CENTER): Payer: Medicare Other | Admitting: Gastroenterology

## 2018-05-13 VITALS — BP 123/78 | HR 63 | Temp 97.1°F | Resp 11 | Ht 67.0 in | Wt 202.0 lb

## 2018-05-13 DIAGNOSIS — Z1212 Encounter for screening for malignant neoplasm of rectum: Secondary | ICD-10-CM | POA: Diagnosis not present

## 2018-05-13 DIAGNOSIS — Z1211 Encounter for screening for malignant neoplasm of colon: Secondary | ICD-10-CM | POA: Diagnosis not present

## 2018-05-13 MED ORDER — SODIUM CHLORIDE 0.9 % IV SOLN: 500.0000 mL | Freq: Once | INTRAVENOUS | Status: DC

## 2018-05-13 NOTE — Progress Notes (Signed)
Pt's states no medical or surgical changes since previsit or office visit. 

## 2018-05-13 NOTE — Patient Instructions (Signed)
YOU HAD AN ENDOSCOPIC PROCEDURE TODAY AT THE [facility]:   Refer to the procedure report that was given to you for any specific questions about what was found during the examination.  If the procedure report does not answer your questions, please call your gastroenterologist to clarify.  If you requested that your care partner not be given the details of your procedure findings, then the procedure report has been included in a sealed envelope for you to review at your convenience later.  YOU SHOULD EXPECT: Some feelings of bloating in the abdomen. Passage of more gas than usual.  Walking can help get rid of the air that was put into your GI tract during the procedure and reduce the bloating. If you had a lower endoscopy (such as a colonoscopy or flexible sigmoidoscopy) you may notice spotting of blood in your stool or on the toilet paper. If you underwent a bowel prep for your procedure, you may not have a normal bowel movement for a few days.  Please Note:  You might notice some irritation and congestion in your nose or some drainage.  This is from the oxygen used during your procedure.  There is no need for concern and it should clear up in a day or so.  SYMPTOMS TO REPORT IMMEDIATELY:   Following lower endoscopy (colonoscopy or flexible sigmoidoscopy):  Excessive amounts of blood in the stool  Significant tenderness or worsening of abdominal pains  Swelling of the abdomen that is new, acute  Fever of 100F or higher  For urgent or emergent issues, a gastroenterologist can be reached at any hour by calling [phone].   DIET:  We do recommend a small meal at first, but then you may proceed to your regular diet.  Drink plenty of fluids but you should avoid alcoholic beverages for 24 hours.  ACTIVITY:  You should plan to take it easy for the rest of today and you should NOT DRIVE or use heavy machinery until tomorrow (because of the sedation medicines used during the test).     FOLLOW UP: Our staff will call the number listed on your records the next business day following your procedure to check on you and address any questions or concerns that you may have regarding the information given to you following your procedure. If we do not reach you, we will leave a message.  However, if you are feeling well and you are not experiencing any problems, there is no need to return our call.  We will assume that you have returned to your regular daily activities without incident.  If any biopsies were taken you will be contacted by phone or by letter within the next 1-3 weeks.  Please call us at [phone] if you have not heard about the biopsies in 3 weeks.    SIGNATURES/CONFIDENTIALITY: You and/or your care partner have signed paperwork which will be entered into your electronic medical record.  These signatures attest to the fact that that the information above on your After Visit Summary has been reviewed and is understood.  Full responsibility of the confidentiality of this discharge information lies with you and/or your care-partner. 

## 2018-05-13 NOTE — Op Note (Signed)
[facility] Patient Name: Brendan Simpson Procedure Date: 05/13/2018 2:55 PM MRN: [id number] Endoscopist: Sherilyn Cooter L. Myrtie Neither , MD Age: 67 Referring MD:  Date of Birth: 04-23-1951 Gender: Male Account #: [id number] Procedure:                Colonoscopy Indications:              Screening for colorectal malignant neoplasm (no                            polyps last colonoscopy 2009) Medicines:                Monitored Anesthesia Care Procedure:                Pre-Anesthesia Assessment:                           - Prior to the procedure, a History and Physical                            was performed, and patient medications and                            allergies were reviewed. The patient's tolerance of                            previous anesthesia was also reviewed. The risks                            and benefits of the procedure and the sedation                            options and risks were discussed with the patient.                            All questions were answered, and informed consent                            was obtained. Anticoagulants: The patient has taken                            aspirin. It was decided not to withhold this                            medication prior to the procedure. ASA Grade                            Assessment: II - A patient with mild systemic                            disease. After reviewing the risks and benefits,                            the patient was deemed in satisfactory condition to  undergo the procedure.                           After obtaining informed consent, the colonoscope                            was passed under direct vision. Throughout the                            procedure, the patient's blood pressure, pulse, and                            oxygen saturations were monitored continuously. The                            Colonoscope was introduced through the anus and                             advanced to the the cecum, identified by                            appendiceal orifice and ileocecal valve. The                            colonoscopy was performed without difficulty. The                            patient tolerated the procedure well. The quality                            of the bowel preparation was good. The ileocecal                            valve, appendiceal orifice, and rectum were                            photographed. The quality of the bowel preparation                            was evaluated using the BBPS [facility]([place] Bowel                            Preparation Scale) with scores of: Right Colon = 2,                            Transverse Colon = 2 and Left Colon = 2. The total                            BBPS score equals 6. Scope In: 3:04:41 PM Scope Out: 3:20:00 PM Scope Withdrawal Time: 0 hours 11 minutes 14 seconds  Total Procedure Duration: 0 hours 15 minutes 19 seconds  Findings:                 The perianal and digital rectal examinations were  normal.                           The entire examined colon appeared normal on direct                            and retroflexion views. Complications:            No immediate complications. Estimated Blood Loss:     Estimated blood loss: none. Impression:               - The entire examined colon is normal on direct and                            retroflexion views.                           - No specimens collected. Recommendation:           - Patient has a contact number available for                            emergencies. The signs and symptoms of potential                            delayed complications were discussed with the                            patient. Return to normal activities tomorrow.                            Written discharge instructions were provided to the                            patient.                           - Resume previous  diet.                           - Continue present medications.                           - Repeat colonoscopy in 10 years for screening                            purposes. Brendan Simpson L. Myrtie Neitheranis, MD 05/13/2018 3:22:46 PM This report has been signed electronically.

## 2018-05-13 NOTE — Progress Notes (Signed)
To PACU, VSS. Report to [email or web address] 

## 2018-05-14 ENCOUNTER — Telehealth: Payer: Self-pay | Admitting: *Deleted

## 2018-05-14 DIAGNOSIS — M62830 Muscle spasm of back: Secondary | ICD-10-CM | POA: Diagnosis not present

## 2018-05-14 DIAGNOSIS — M9902 Segmental and somatic dysfunction of thoracic region: Secondary | ICD-10-CM | POA: Diagnosis not present

## 2018-05-14 DIAGNOSIS — M5432 Sciatica, left side: Secondary | ICD-10-CM | POA: Diagnosis not present

## 2018-05-14 DIAGNOSIS — M545 Low back pain: Secondary | ICD-10-CM | POA: Diagnosis not present

## 2018-05-14 DIAGNOSIS — M9903 Segmental and somatic dysfunction of lumbar region: Secondary | ICD-10-CM | POA: Diagnosis not present

## 2018-05-14 DIAGNOSIS — J301 Allergic rhinitis due to pollen: Secondary | ICD-10-CM | POA: Diagnosis not present

## 2018-05-14 DIAGNOSIS — J3089 Other allergic rhinitis: Secondary | ICD-10-CM | POA: Diagnosis not present

## 2018-05-14 DIAGNOSIS — M9901 Segmental and somatic dysfunction of cervical region: Secondary | ICD-10-CM | POA: Diagnosis not present

## 2018-05-14 NOTE — Telephone Encounter (Signed)
Spoke with pt and abdominal pain is gone now.  No further concerns.

## 2018-05-14 NOTE — Telephone Encounter (Signed)
I agree with plan.  Check on him later today. Does sound like food may not have agreed with him yesterday.

## 2018-05-14 NOTE — Telephone Encounter (Signed)
  Follow up Call-  Call back number 05/13/2018 05/08/2017  Post procedure Call Back phone  # [phone]  Permission to leave phone message Yes Yes  Some recent data might be hidden     Patient questions:  Do you have a fever, pain , or abdominal swelling? Yes.   Pain Score  5 *  Have you tolerated food without any problems? Yes.    Have you been able to return to your normal activities? Yes.    Do you have any questions about your discharge instructions: Diet   No. Medications  No. Follow up visit  No.  Do you have questions or concerns about your Care? No.  Actions: * If pain score is 4 or above: Physician/ provider Notified : Charlie PitterHenry L Danis, MD.  Pt c/o abdominal pain, rating as a "5" this morning.  He stated he didn't have any pain yesterday.  He has been passing gas.  No fever, distension, or bleeding noted.  He states he ate "a lot of stewed beef last night."  I told him that he might have some trapped air; told him to drink warm fluids and walk today.  Or he might have eaten something that didn't agree with his empty stomach.  I will call him back later today to check on him.  Dr. Myrtie Neitheranis, Is there anything else you'd like me to do?

## 2018-05-22 DIAGNOSIS — J3089 Other allergic rhinitis: Secondary | ICD-10-CM | POA: Diagnosis not present

## 2018-05-22 DIAGNOSIS — J301 Allergic rhinitis due to pollen: Secondary | ICD-10-CM | POA: Diagnosis not present

## 2018-05-28 DIAGNOSIS — J301 Allergic rhinitis due to pollen: Secondary | ICD-10-CM | POA: Diagnosis not present

## 2018-05-28 DIAGNOSIS — J3089 Other allergic rhinitis: Secondary | ICD-10-CM | POA: Diagnosis not present

## 2018-05-30 DIAGNOSIS — J301 Allergic rhinitis due to pollen: Secondary | ICD-10-CM | POA: Diagnosis not present

## 2018-05-30 DIAGNOSIS — J3089 Other allergic rhinitis: Secondary | ICD-10-CM | POA: Diagnosis not present

## 2018-06-02 ENCOUNTER — Other Ambulatory Visit: Payer: Self-pay | Admitting: Family Medicine

## 2018-06-10 DIAGNOSIS — J301 Allergic rhinitis due to pollen: Secondary | ICD-10-CM | POA: Diagnosis not present

## 2018-06-10 DIAGNOSIS — J3089 Other allergic rhinitis: Secondary | ICD-10-CM | POA: Diagnosis not present

## 2018-06-13 DIAGNOSIS — J3089 Other allergic rhinitis: Secondary | ICD-10-CM | POA: Diagnosis not present

## 2018-06-13 DIAGNOSIS — J301 Allergic rhinitis due to pollen: Secondary | ICD-10-CM | POA: Diagnosis not present

## 2018-06-18 DIAGNOSIS — J301 Allergic rhinitis due to pollen: Secondary | ICD-10-CM | POA: Diagnosis not present

## 2018-06-18 DIAGNOSIS — J3089 Other allergic rhinitis: Secondary | ICD-10-CM | POA: Diagnosis not present

## 2018-06-20 DIAGNOSIS — J3089 Other allergic rhinitis: Secondary | ICD-10-CM | POA: Diagnosis not present

## 2018-06-20 DIAGNOSIS — J301 Allergic rhinitis due to pollen: Secondary | ICD-10-CM | POA: Diagnosis not present

## 2018-06-21 ENCOUNTER — Encounter: Payer: Self-pay | Admitting: Family Medicine

## 2018-06-21 ENCOUNTER — Ambulatory Visit (INDEPENDENT_AMBULATORY_CARE_PROVIDER_SITE_OTHER): Payer: Medicare Other | Admitting: Family Medicine

## 2018-06-21 VITALS — BP 140/80 | HR 71 | Temp 98.4°F | Ht 66.75 in | Wt 198.0 lb

## 2018-06-21 DIAGNOSIS — M791 Myalgia, unspecified site: Secondary | ICD-10-CM

## 2018-06-21 DIAGNOSIS — E781 Pure hyperglyceridemia: Secondary | ICD-10-CM | POA: Diagnosis not present

## 2018-06-21 DIAGNOSIS — G8929 Other chronic pain: Secondary | ICD-10-CM

## 2018-06-21 DIAGNOSIS — Z23 Encounter for immunization: Secondary | ICD-10-CM | POA: Diagnosis not present

## 2018-06-21 DIAGNOSIS — I10 Essential (primary) hypertension: Secondary | ICD-10-CM

## 2018-06-21 DIAGNOSIS — E88819 Insulin resistance, unspecified: Secondary | ICD-10-CM

## 2018-06-21 DIAGNOSIS — R200 Anesthesia of skin: Secondary | ICD-10-CM | POA: Diagnosis not present

## 2018-06-21 DIAGNOSIS — E8881 Metabolic syndrome: Secondary | ICD-10-CM | POA: Diagnosis not present

## 2018-06-21 DIAGNOSIS — T466X5A Adverse effect of antihyperlipidemic and antiarteriosclerotic drugs, initial encounter: Secondary | ICD-10-CM

## 2018-06-21 DIAGNOSIS — K219 Gastro-esophageal reflux disease without esophagitis: Secondary | ICD-10-CM

## 2018-06-21 DIAGNOSIS — M5442 Lumbago with sciatica, left side: Secondary | ICD-10-CM

## 2018-06-21 DIAGNOSIS — E039 Hypothyroidism, unspecified: Secondary | ICD-10-CM | POA: Diagnosis not present

## 2018-06-21 DIAGNOSIS — R7303 Prediabetes: Secondary | ICD-10-CM | POA: Insufficient documentation

## 2018-06-21 LAB — COMPREHENSIVE METABOLIC PANEL
ALT: 37 U/L (ref 0–53)
AST: 27 U/L (ref 0–37)
Albumin: 4.8 g/dL (ref 3.5–5.2)
Alkaline Phosphatase: 55 U/L (ref 39–117)
BUN: 11 mg/dL (ref 6–23)
CO2: 29 mEq/L (ref 19–32)
Calcium: 9.6 mg/dL (ref 8.4–10.5)
Chloride: 103 mEq/L (ref 96–112)
Creatinine, Ser: 1.07 mg/dL (ref 0.40–1.50)
GFR: 89.11 mL/min (ref >60.00)
Glucose, Bld: 107 mg/dL — ABNORMAL HIGH (ref 70–99)
Potassium: 4.1 mEq/L (ref 3.5–5.1)
Sodium: 141 mEq/L (ref 135–145)
Total Bilirubin: 0.5 mg/dL (ref 0.2–1.2)
Total Protein: 7.8 g/dL (ref 6.0–8.3)

## 2018-06-21 LAB — LIPID PANEL
Cholesterol: 166 mg/dL (ref 0–200)
HDL: 42.7 mg/dL (ref >39.00)
LDL Cholesterol: 105 mg/dL — ABNORMAL HIGH (ref 0–99)
NonHDL: 123.12
Total CHOL/HDL Ratio: 4
Triglycerides: 90 mg/dL (ref 0.0–149.0)
VLDL: 18 mg/dL (ref 0.0–40.0)

## 2018-06-21 LAB — CK: Total CK: 646 U/L — ABNORMAL HIGH (ref 7–232)

## 2018-06-21 MED ORDER — AMLODIPINE BESYLATE 10 MG PO TABS: 10.0000 mg | ORAL_TABLET | Freq: Every day | ORAL | 2 refills | Status: DC

## 2018-06-21 MED ORDER — ESOMEPRAZOLE MAGNESIUM 40 MG PO CPDR: 40.0000 mg | DELAYED_RELEASE_CAPSULE | Freq: Every day | ORAL | 2 refills | Status: DC

## 2018-06-21 MED ORDER — LEVOTHYROXINE SODIUM 88 MCG PO TABS: 88.0000 ug | ORAL_TABLET | Freq: Every day | ORAL | 2 refills | Status: DC

## 2018-06-21 NOTE — Progress Notes (Signed)
Brendan Simpson is a 67 y.o. male is here for follow up.  History of Present Illness:   Brendan Simpson, CMA acting as scribe for Dr. Helane Rima.   HPI: Patient in office to follow up on labs. He states that when he checks his blood pressure at home after workout it is 111/71 as normal. Before workout it is 120/78. He does not have any dizziness or any other symptoms after work out. He denies any swelling of feet or legs. No chest pains or palpitations. He has been taking the Lipitor 20mg  due to increased CK in labs. He has not had any side effects at this does. No cramping. Has been taking daily.   Results for orders placed or performed in visit on 03/20/18  CBC with Differential/Platelet  Result Value Ref Range   WBC 6.3 4.0 - 10.5 K/uL   RBC 5.20 4.22 - 5.81 Mil/uL   Hemoglobin 15.8 13.0 - 17.0 g/dL   HCT 16.1 09.6 - 04.5 %   MCV 89.1 78.0 - 100.0 fl   MCHC 34.2 30.0 - 36.0 g/dL   RDW 40.9 81.1 - 91.4 %   Platelets 207.0 150.0 - 400.0 K/uL   Neutrophils Relative % 49.8 43.0 - 77.0 %   Lymphocytes Relative 35.8 12.0 - 46.0 %   Monocytes Relative 9.5 3.0 - 12.0 %   Eosinophils Relative 3.9 0.0 - 5.0 %   Basophils Relative 1.0 0.0 - 3.0 %   Neutro Abs 3.1 1.4 - 7.7 K/uL   Lymphs Abs 2.2 0.7 - 4.0 K/uL   Monocytes Absolute 0.6 0.1 - 1.0 K/uL   Eosinophils Absolute 0.2 0.0 - 0.7 K/uL   Basophils Absolute 0.1 0.0 - 0.1 K/uL  Comprehensive metabolic panel  Result Value Ref Range   Sodium 140 135 - 145 mEq/L   Potassium 4.5 3.5 - 5.1 mEq/L   Chloride 102 96 - 112 mEq/L   CO2 31 19 - 32 mEq/L   Glucose, Bld 110 (H) 70 - 99 mg/dL   BUN 10 6 - 23 mg/dL   Creatinine, Ser 7.82 0.40 - 1.50 mg/dL   Total Bilirubin 0.5 0.2 - 1.2 mg/dL   Alkaline Phosphatase 61 39 - 117 U/L   AST 22 0 - 37 U/L   ALT 37 0 - 53 U/L   Total Protein 7.5 6.0 - 8.3 g/dL   Albumin 4.8 3.5 - 5.2 g/dL   Calcium 9.6 8.4 - 95.6 mg/dL   GFR 21.30 >86.57 mL/min  Lipid panel  Result Value Ref Range   Cholesterol 178 0 - 200 mg/dL   Triglycerides 84.6 0.0 - 149.0 mg/dL   HDL 96.29 >52.84 mg/dL   VLDL 13.2 0.0 - 44.0 mg/dL   LDL Cholesterol 102 (H) 0 - 99 mg/dL   Total CHOL/HDL Ratio 4    NonHDL 130.13   TSH  Result Value Ref Range   TSH 1.71 0.35 - 4.50 uIU/mL  T4, free  Result Value Ref Range   Free T4 0.72 0.60 - 1.60 ng/dL  Hemoglobin V2Z  Result Value Ref Range   Hgb A1c MFr Bld 6.3 4.6 - 6.5 %  PSA  Result Value Ref Range   PSA 1.51 0.10 - 4.00 ng/mL  CK  Result Value Ref Range   Total CK 394 (H) 7 - 232 U/L  Uric acid  Result Value Ref Range   Uric Acid, Serum 5.8 4.0 - 7.8 mg/dL   There are no preventive care reminders to display  for this patient. [facility] screen [facility] 03/20/2018 01/01/2017  Decreased Interest 0 0  Down, Depressed, Hopeless 0 0  PHQ - 2 Score 0 0   PMHx, SurgHx, SocialHx, FamHx, Medications, and Allergies were reviewed in the Visit Navigator and updated as appropriate.   Patient Active Problem List   Diagnosis Date Noted  . 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 Myalgia due to statin at Lipitor 40 03/24/2018  . Chronic left-sided low back pain with left-sided sciatica 03/20/2018  . Arthritis 02/03/2016  . Esophageal reflux 02/03/2016  . Gout 02/03/2016  . Benign essential hypertension 02/03/2016  . Hearing reduced, wears hearing aids bilaterally 02/03/2016  . Hypothyroidism 02/03/2016  . Hyperlipidemia 02/03/2016   Social History   Tobacco Use  . Smoking status: Never Smoker  . Smokeless tobacco: Never Used  Substance Use Topics  . Alcohol use: Yes    Alcohol/week: 0.0 standard drinks    Comment: occasioinal  . Drug use: No   Current Medications and Allergies:   .  allopurinol (ZYLOPRIM) 100 MG tablet, TAKE 1 TABLET BY MOUTH EVERY DAY, Disp: 90 tablet, Rfl: 2 .  amLODipine (NORVASC) 10 MG tablet, Take 1 tablet (10 mg total) by mouth daily., Disp: 90 tablet, Rfl: 2 .  aspirin EC 81 MG tablet, Take 81 mg by mouth daily., Disp: , Rfl:    .  atorvastatin (LIPITOR) 20 MG tablet, Take 1 tablet (20 mg total) by mouth daily., Disp: 90 tablet, Rfl: 3 .  azelaic acid (AZELEX) 20 % cream, Apply topically as needed. After skin is thoroughly washed and patted dry, gently but thoroughly massage a thin film of azelaic acid cream into the affected area twice daily, in the morning and evening., Disp: 50 g, Rfl: 2 .  Co-Enzyme Q-10 30 MG CAPS, Take 200 mg by mouth daily., Disp: , Rfl:  .  esomeprazole (NEXIUM) 40 MG capsule, TAKE ONE CAPSULE BY MOUTH EVERY DAY, Disp: 90 capsule, Rfl: 2 .  Glucosamine-Chondroit-Vit C-Mn (GLUCOSAMINE 1500 COMPLEX PO), Take 1 tablet by mouth daily., Disp: , Rfl:  .  levothyroxine (SYNTHROID, LEVOTHROID) 88 MCG tablet, TAKE 1 TABLET (88 MCG TOTAL) BY MOUTH DAILY BEFORE BREAKFAST., Disp: 90 tablet, Rfl: 2 .  Magnesium 400 MG TABS, Take by mouth., Disp: , Rfl:  .  OVER THE COUNTER MEDICATION, Fish Oil, 1200 mg two capsules daily., Disp: , Rfl:  .  Probiotic Product (PROBIOTIC DAILY PO), Take 1 tablet by mouth daily., Disp: , Rfl:   No Known Allergies   Review of Systems   Pertinent items are noted in the HPI. Otherwise, ROS is negative.  Vitals:   Vitals:   06/21/18 1004  BP: 140/80  Pulse: 71  Temp: 98.4 F (36.9 C)  TempSrc: Oral  SpO2: 98%  Weight: 198 lb (89.8 kg)  Height: 5' 6.75" (1.695 m)     Body mass index is 31.24 kg/m.  Physical Exam:   Physical Exam  Constitutional: He is oriented to person, place, and time. He appears well-developed and well-nourished. No distress.  HENT:  Head: Normocephalic and atraumatic.  Right Ear: External ear normal.  Left Ear: External ear normal.  Nose: Nose normal.  Mouth/Throat: Oropharynx is clear and moist.  Eyes: Pupils are equal, round, and reactive to light. Conjunctivae and EOM are normal.  Neck: Normal range of motion. Neck supple.  Cardiovascular: Normal rate, regular rhythm, normal heart sounds and intact distal pulses.  Pulmonary/Chest:  Effort normal and breath sounds normal.  Abdominal: Soft. Bowel sounds  are normal.  Neurological: He is alert and oriented to person, place, and time.  Skin: Skin is warm and dry.  Psychiatric: He has a normal mood and affect. His behavior is normal. Judgment and thought content normal.  Nursing note and vitals reviewed.  Assessment and Plan:   [place] was seen today for follow-up.  Diagnoses and all orders for this visit:  Pure hyperglyceridemia  Myalgia due to statin at Lipitor 40 Comments: Improved at Lipitor 20 daily.  Orders: -     Comprehensive metabolic panel -     Lipid panel -     CK  Insulin resistance  Benign essential hypertension -     Lipid panel -     amLODipine (NORVASC) 10 MG tablet; Take 1 tablet (10 mg total) by mouth daily.  Acquired hypothyroidism -     levothyroxine (SYNTHROID, LEVOTHROID) 88 MCG tablet; Take 1 tablet (88 mcg total) by mouth daily before breakfast.  Gastroesophageal reflux disease, esophagitis presence not specified -     esomeprazole (NEXIUM) 40 MG capsule; Take 1 capsule (40 mg total) by mouth daily.  Left leg numbness -     NCV with EMG(electromyography); Future  Needs flu shot  Chronic left-sided low back pain with left-sided sciatica  Encounter for immunization -     Flu vaccine HIGH DOSE PF   . Reviewed expectations re: course of current medical issues. . Discussed self-management of symptoms. . Outlined signs and symptoms indicating need for more acute intervention. . Patient verbalized understanding and all questions were answered. Marland Kitchen Health Maintenance issues including appropriate healthy diet, exercise, and smoking avoidance were discussed with patient. . See orders for this visit as documented in the electronic medical record. . Patient received an After Visit Summary.  CMA served as Neurosurgeon during this visit. History, Physical, and Plan performed by medical provider. The above documentation has been reviewed and is  accurate and complete. Helane Rima, D.O.  Helane Rima, DO [place] 06/23/2018

## 2018-06-23 ENCOUNTER — Encounter: Payer: Self-pay | Admitting: Family Medicine

## 2018-06-24 ENCOUNTER — Other Ambulatory Visit: Payer: Self-pay

## 2018-06-24 DIAGNOSIS — E785 Hyperlipidemia, unspecified: Secondary | ICD-10-CM

## 2018-06-24 DIAGNOSIS — E781 Pure hyperglyceridemia: Secondary | ICD-10-CM

## 2018-06-25 ENCOUNTER — Ambulatory Visit (INDEPENDENT_AMBULATORY_CARE_PROVIDER_SITE_OTHER): Payer: Medicare Other | Admitting: Internal Medicine

## 2018-06-25 ENCOUNTER — Encounter: Payer: Self-pay | Admitting: Internal Medicine

## 2018-06-25 VITALS — BP 130/82 | HR 76 | Ht 67.0 in | Wt 199.4 lb

## 2018-06-25 DIAGNOSIS — E785 Hyperlipidemia, unspecified: Secondary | ICD-10-CM

## 2018-06-25 DIAGNOSIS — J3089 Other allergic rhinitis: Secondary | ICD-10-CM | POA: Diagnosis not present

## 2018-06-25 DIAGNOSIS — Z9189 Other specified personal risk factors, not elsewhere classified: Secondary | ICD-10-CM | POA: Diagnosis not present

## 2018-06-25 DIAGNOSIS — G720 Drug-induced myopathy: Secondary | ICD-10-CM

## 2018-06-25 DIAGNOSIS — T466X5A Adverse effect of antihyperlipidemic and antiarteriosclerotic drugs, initial encounter: Secondary | ICD-10-CM

## 2018-06-25 DIAGNOSIS — J301 Allergic rhinitis due to pollen: Secondary | ICD-10-CM | POA: Diagnosis not present

## 2018-06-25 NOTE — Patient Instructions (Signed)
Medication Instructions:   STOP atorvastatin   Labwork:  FASTING lab work in 1 month - lipid panel, liver function panel, CK  Testing/Procedures:  NONE  Follow-Up:  Your physician recommends that you schedule a follow-up appointment in: THREE MONTHS with Dr. Rennis GoldenHilty (lipid clinic)   If you need a refill on your cardiac medications before your next appointment, please call your pharmacy.  Any Other Special Instructions Will Be Listed Below (If Applicable).

## 2018-06-25 NOTE — Progress Notes (Signed)
OFFICE CONSULT NOTE  Chief Complaint:  Elevated triglyceride  Primary Care Physician: Helane RimaWallace, Erica, DO  HPI:  Brendan Simpson is a 67 y.o. male African American, who is being seen today for the evaluation of elevated triglycerides and high CK at the request of Helane RimaWallace, Erica, DO.  [name] is 67 year old male with multiple medical problems including history of anxiety/depression, arthritis, cataracts, GERD, hypertension, and dyslipidemia.  He has been treated on atorvastatin at various doses over the years, initially at 10 and then 20 mg, but recently due to increased cholesterol had his atorvastatin dose increased to 40 mg.  His PCP has been following creatinine kinase because he has had a history of SAMS symptoms.  With regards to this, [name] reports that he has had significant myalgias, worsening after 3 to 4 weeks recently with the dose increase, including typically the proximal muscle groups, hip flexors and calves, with nocturnal cramping and pain that develops oftentimes during sexual activity.  He was advised by his primary care provider to decrease the dose of his statin after recent lab work indicated an improvement in his lipid profile with a decrease in LDL cholesterol from 113-105 (and expected 6% decrease), however, his total CK increased from 394-646.  Based on these changes his dose was decreased back to 20 mg daily and a referral to the lipid clinic was placed.  Overall Mr. Sheran SpineWinsett reports his SAMS symptoms have improved significantly.  According to the [facility] questionnaire he scored 11 suggesting highly likely statin associated muscle symptoms.  We reviewed the table of norms, for baseline CK across the population.  It should be understood that African-American males typically have higher CK values.  The median serum CK in African-American males is 213 as compared to Caucasian males which is 143.  Normal distribution is between 71 and 801.  That being said, an elevated CK is  not unusual in African-American males, but the increase in CK and associated SAMS symptoms suggest statin myopathy.  I also assessed his other medications to check whether there are any significant hepatic interactions and there does not appear to be.  Atorvastatin is actually commonly associated with this as it is a potent CYP 3 A4 inducer.  PMHx:  Past Medical History:  Diagnosis Date  . Allergy   . Arthritis   . Cataract   . GERD (gastroesophageal reflux disease)   . Gout   . Hyperlipidemia   . Hypertension   . Hypothyroidism 02/03/2016  . Uses hearing aid     Past Surgical History:  Procedure Laterality Date  . CARPAL TUNNEL RELEASE Bilateral   . CATARACT EXTRACTION, BILATERAL Bilateral   . KNEE ARTHROSCOPY Left     FAMHx:  Family History  Problem Relation Age of Onset  . Breast cancer Mother   . Heart disease Father   . Diabetes Sister   . Diabetes Brother   . Colon cancer Neg Hx   . Esophageal cancer Neg Hx   . Rectal cancer Neg Hx   . Stomach cancer Neg Hx     SOCHx:   reports that he has never smoked. He has never used smokeless tobacco. He reports that he drinks alcohol. He reports that he does not use drugs.  ALLERGIES:  No Known Allergies  ROS: Pertinent items noted in HPI and remainder of comprehensive ROS otherwise negative.  HOME MEDS: Current Outpatient Medications on File Prior to Visit  Medication Sig Dispense Refill  . allopurinol (ZYLOPRIM) 100 MG tablet  TAKE 1 TABLET BY MOUTH EVERY DAY 90 tablet 2  . amLODipine (NORVASC) 10 MG tablet Take 1 tablet (10 mg total) by mouth daily. 90 tablet 2  . aspirin EC 81 MG tablet Take 81 mg by mouth daily.    Marland Kitchen azelaic acid (AZELEX) 20 % cream Apply topically as needed. After skin is thoroughly washed and patted dry, gently but thoroughly massage a thin film of azelaic acid cream into the affected area twice daily, in the morning and evening. 50 g 2  . Co-Enzyme Q-10 30 MG CAPS Take 200 mg by mouth daily.    Marland Kitchen  esomeprazole (NEXIUM) 40 MG capsule Take 1 capsule (40 mg total) by mouth daily. 90 capsule 2  . Glucosamine-Chondroit-Vit C-Mn (GLUCOSAMINE 1500 COMPLEX PO) Take 1 tablet by mouth daily.    Marland Kitchen levothyroxine (SYNTHROID, LEVOTHROID) 88 MCG tablet Take 1 tablet (88 mcg total) by mouth daily before breakfast. 90 tablet 2  . Magnesium 400 MG TABS Take by mouth.    Marland Kitchen OVER THE COUNTER MEDICATION Fish Oil, 1200 mg two capsules daily.    . Probiotic Product (PROBIOTIC DAILY PO) Take 1 tablet by mouth daily.    Marland Kitchen atorvastatin (LIPITOR) 20 MG tablet Take 1 tablet (20 mg total) by mouth daily. (Patient not taking: Reported on 06/25/2018) 90 tablet 3   No current facility-administered medications on file prior to visit.     LABS/IMAGING: No results found for this or any previous visit (from the past 48 hour(s)). No results found.  LIPID PANEL:    Component Value Date/Time   CHOL 166 06/21/2018 1017   TRIG 90.0 06/21/2018 1017   HDL 42.70 06/21/2018 1017   CHOLHDL 4 06/21/2018 1017   VLDL 18.0 06/21/2018 1017   LDLCALC 105 (H) 06/21/2018 1017   LDLDIRECT 102.0 09/27/2017 0918    WEIGHTS: Wt Readings from Last 3 Encounters:  06/25/18 199 lb 6.4 oz (90.4 kg)  06/21/18 198 lb (89.8 kg)  05/13/18 202 lb (91.6 kg)    VITALS: BP 130/82   Pulse 76   Ht 5\' 7"  (1.702 m)   Wt 199 lb 6.4 oz (90.4 kg)   BMI 31.23 kg/m   EXAM: General appearance: alert and no distress Neck: no carotid bruit, no JVD and thyroid not enlarged, symmetric, no tenderness/mass/nodules Lungs: clear to auscultation bilaterally Heart: regular rate and rhythm, S1, S2 normal, no murmur, click, rub or gallop Abdomen: soft, non-tender; bowel sounds normal; no masses,  no organomegaly Extremities: extremities normal, atraumatic, no cyanosis or edema Pulses: 2+ and symmetric Skin: Skin color, texture, turgor normal. 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 Intermediate 10-year coronary risk (goal LDL <100)  PLAN: 1.   Mr. Redder is describing statin associated myalgia and likely has myopathy with increasing CK in the setting of up titration of atorvastatin.  This is a potent CYP 3 A4 inducer and therefore more likely to be associated with elevated CK.  Given these changes he should stop atorvastatin immediately.  We will need to allow his CK levels to resolve.  I would like to check a baseline CK level and fasting lipid profile and liver function in 1 month.  At that time we will likely switch him to rosuvastatin, which is not metabolized through the CYP 3 A4 enzymes and will be less likely to increase CK.  We will continue to monitor CK and if tolerated and  able to achieve goal would continue this medication approach.  Fortunately he is otherwise in good condition, eats fairly healthy and exercises regularly.  He has no known coronary disease or prior cardiac events.  Thanks again for the kind referral.  Follow-up with me in 3 months.  Chrystie Nose, MD, [facility], FACP    [facility]  Medical Director of the [facility]  [facility] Clinic Diplomate of the American Board of Clinical Lipidology Attending Cardiologist  Direct Dial: [phone]  Fax: [phone]  Website:  [email or web address] 06/25/2018, 8:51 AM

## 2018-06-26 ENCOUNTER — Encounter: Payer: Self-pay | Admitting: Internal Medicine

## 2018-06-26 DIAGNOSIS — Z9189 Other specified personal risk factors, not elsewhere classified: Secondary | ICD-10-CM | POA: Insufficient documentation

## 2018-06-26 DIAGNOSIS — G720 Drug-induced myopathy: Secondary | ICD-10-CM | POA: Insufficient documentation

## 2018-06-26 DIAGNOSIS — T466X5A Adverse effect of antihyperlipidemic and antiarteriosclerotic drugs, initial encounter: Secondary | ICD-10-CM | POA: Insufficient documentation

## 2018-07-09 DIAGNOSIS — J3089 Other allergic rhinitis: Secondary | ICD-10-CM | POA: Diagnosis not present

## 2018-07-09 DIAGNOSIS — J301 Allergic rhinitis due to pollen: Secondary | ICD-10-CM | POA: Diagnosis not present

## 2018-07-25 ENCOUNTER — Other Ambulatory Visit (INDEPENDENT_AMBULATORY_CARE_PROVIDER_SITE_OTHER): Payer: Medicare Other

## 2018-07-25 ENCOUNTER — Other Ambulatory Visit: Payer: Self-pay | Admitting: *Deleted

## 2018-07-25 DIAGNOSIS — Z9189 Other specified personal risk factors, not elsewhere classified: Secondary | ICD-10-CM

## 2018-07-25 DIAGNOSIS — E785 Hyperlipidemia, unspecified: Secondary | ICD-10-CM | POA: Diagnosis not present

## 2018-07-25 DIAGNOSIS — G720 Drug-induced myopathy: Secondary | ICD-10-CM

## 2018-07-25 DIAGNOSIS — T466X5A Adverse effect of antihyperlipidemic and antiarteriosclerotic drugs, initial encounter: Secondary | ICD-10-CM

## 2018-07-25 LAB — LIPID PANEL
Cholesterol: 258 mg/dL — ABNORMAL HIGH (ref 0–200)
HDL: 40.8 mg/dL (ref >39.00)
LDL Cholesterol: 181 mg/dL — ABNORMAL HIGH (ref 0–99)
NonHDL: 217.5
Total CHOL/HDL Ratio: 6
Triglycerides: 184 mg/dL — ABNORMAL HIGH (ref 0.0–149.0)
VLDL: 36.8 mg/dL (ref 0.0–40.0)

## 2018-07-25 LAB — HEPATIC FUNCTION PANEL
ALT: 42 U/L (ref 0–53)
AST: 25 U/L (ref 0–37)
Albumin: 4.7 g/dL (ref 3.5–5.2)
Alkaline Phosphatase: 54 U/L (ref 39–117)
Bilirubin, Direct: 0.1 mg/dL (ref 0.0–0.3)
Total Bilirubin: 0.4 mg/dL (ref 0.2–1.2)
Total Protein: 7.7 g/dL (ref 6.0–8.3)

## 2018-07-25 LAB — CK: Total CK: 368 U/L — ABNORMAL HIGH (ref 7–232)

## 2018-07-30 DIAGNOSIS — J3089 Other allergic rhinitis: Secondary | ICD-10-CM | POA: Diagnosis not present

## 2018-07-30 DIAGNOSIS — J301 Allergic rhinitis due to pollen: Secondary | ICD-10-CM | POA: Diagnosis not present

## 2018-07-30 DIAGNOSIS — H1045 Other chronic allergic conjunctivitis: Secondary | ICD-10-CM | POA: Diagnosis not present

## 2018-08-01 ENCOUNTER — Telehealth: Payer: Self-pay | Admitting: Internal Medicine

## 2018-08-01 DIAGNOSIS — T466X5A Adverse effect of antihyperlipidemic and antiarteriosclerotic drugs, initial encounter: Secondary | ICD-10-CM

## 2018-08-01 DIAGNOSIS — G720 Drug-induced myopathy: Secondary | ICD-10-CM

## 2018-08-01 DIAGNOSIS — E785 Hyperlipidemia, unspecified: Secondary | ICD-10-CM

## 2018-08-01 MED ORDER — ROSUVASTATIN CALCIUM 40 MG PO TABS: 40.0000 mg | ORAL_TABLET | Freq: Every day | ORAL | 3 refills | Status: DC

## 2018-08-01 NOTE — Telephone Encounter (Signed)
Patient returning call about lab results.  

## 2018-08-01 NOTE — Telephone Encounter (Signed)
Notes recorded by Chrystie Nose, MD on 07/26/2018 at 10:13 PM EDT Normal liver enzymes. Cholesterol is much higher. Total CK is lower at 368.  Dr. Rexene Edison  Start Crestor 40 mg daily. Repeat liver enyzmes and CK in 1 month and lipids in 3 months.   Dr. Rexene Edison   Patient called with results. Agrees w/MD plan. Rx(s) sent to pharmacy electronically. Lab orders mailed & 3 month f/up appt moved to Oct 24 2018

## 2018-08-06 DIAGNOSIS — J3089 Other allergic rhinitis: Secondary | ICD-10-CM | POA: Diagnosis not present

## 2018-08-06 DIAGNOSIS — J301 Allergic rhinitis due to pollen: Secondary | ICD-10-CM | POA: Diagnosis not present

## 2018-08-27 DIAGNOSIS — J3089 Other allergic rhinitis: Secondary | ICD-10-CM | POA: Diagnosis not present

## 2018-08-27 DIAGNOSIS — J301 Allergic rhinitis due to pollen: Secondary | ICD-10-CM | POA: Diagnosis not present

## 2018-09-17 ENCOUNTER — Encounter: Payer: Self-pay | Admitting: Sports Medicine

## 2018-09-17 ENCOUNTER — Ambulatory Visit (INDEPENDENT_AMBULATORY_CARE_PROVIDER_SITE_OTHER): Payer: Medicare Other | Admitting: Sports Medicine

## 2018-09-17 VITALS — BP 140/88 | HR 79 | Ht 67.0 in | Wt 200.0 lb

## 2018-09-17 DIAGNOSIS — M546 Pain in thoracic spine: Secondary | ICD-10-CM

## 2018-09-17 DIAGNOSIS — M9908 Segmental and somatic dysfunction of rib cage: Secondary | ICD-10-CM | POA: Diagnosis not present

## 2018-09-17 NOTE — Progress Notes (Signed)
PROCEDURE NOTE: THERAPEUTIC EXERCISES (97110) 15 minutes spent for Therapeutic exercises as below and as referenced in the AVS.  This included exercises focusing on stretching, strengthening, with significant focus on eccentric aspects.   Proper technique shown and discussed handout in great detail with ATC.  All questions were discussed and answered.   Long term goals include an improvement in range of motion, strength, endurance as well as avoiding reinjury. Frequency of visits is one time as determined during today's  office visit. Frequency of exercises to be performed is as per handout.  EXERCISES REVIEWED:  Scapular Stabilization  Thoracic Mobility 

## 2018-09-17 NOTE — Progress Notes (Signed)
Brendan Simpson. B[facility] Simpson [facility] [place] at [facility] [phone]  Brendan Simpson - 67 y.o. male MRN [id number]  Date of birth: December 24, 1950  Visit Date:09/17/2018    PCP: Brendan Rima, DO   Referred by: Brendan Rima, DO   SUBJECTIVE:  Chief Complaint  Patient presents with  . New Patient (Initial Visit)    R rib pain    HPI: Patient presents for right posterior pain that is been present for several months.  Pain is located on both the anterior chest wall and posterior.  Denies any known eliciting injury.  He has had a hard time taking a deep breath but denies any shortness of breath.  REVIEW OF SYSTEMS: Denies night time disturbances. Denies fevers, chills, or night sweats. Denies unexplained weight loss. Denies personal history of cancer. Denies changes in bowel or bladder habits. Denies recent unreported falls. Denies new or worsening dyspnea or wheezing. Denies headaches or dizziness.  Denies numbness, tingling or weakness  In the extremities.  Denies dizziness or presyncopal episodes Denies lower extremity edema    HISTORY:  Prior history reviewed and updated per electronic medical record.  Patient Active Problem List   Diagnosis Date Noted  . 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 10 year risk of MI or stroke 7.5% or greater 06/26/2018  . 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 Myalgia due to statin at Lipitor 40 03/24/2018  . Chronic left-sided low back pain with left-sided sciatica 03/20/2018    Work injury.    . Arthritis 02/03/2016  . Esophageal reflux 02/03/2016  . Gout 02/03/2016  . Benign essential hypertension 02/03/2016  . Hearing reduced, wears hearing aids bilaterally 02/03/2016  . Hypothyroidism 02/03/2016  . Hyperlipidemia 02/03/2016   Social History   Occupational History  . Occupation: Facilities manager: [employer]  Tobacco Use  . Smoking status: Never Smoker  . Smokeless tobacco: Never Used    Substance and Sexual Activity  . Alcohol use: Yes    Alcohol/week: 0.0 standard drinks    Comment: occasioinal  . Drug use: No  . Sexual activity: Yes    Partners: Female   Social History   Social History Narrative   [employer] 4 days per week. Aerobic and strength training. Christian.    Good news agree Past Medical History:  Diagnosis Date  . Allergy   . Arthritis   . Cataract   . GERD (gastroesophageal reflux disease)   . Gout   . Hyperlipidemia   . Hypertension   . Hypothyroidism 02/03/2016  . Uses hearing aid    Past Surgical History:  Procedure Laterality Date  . CARPAL TUNNEL RELEASE Bilateral   . CATARACT EXTRACTION, BILATERAL Bilateral   . KNEE ARTHROSCOPY Left    family history includes Breast cancer in his mother; Diabetes in his brother and sister; Heart disease in his father. There is no history of Colon cancer, Esophageal cancer, Rectal cancer, or Stomach cancer. Recent Labs    03/20/18 0937  03/20/18 1000 06/21/18 1017 07/25/18 1022 10/18/18 0956  HGBA1C 6.3  --   --   --   --   --   LABURIC  --   --  5.8  --   --   --   CREATININE 1.04  --   --  1.07  --   --   CALCIUM 9.6  --   --  9.6  --   --   AST 22  --   --  27 25 21   ALT 37  --   --  37 42 28  CKTOTAL  --    < > 394* 646* 368* 429*  TSH 1.71  --   --   --   --   --    < > = values in this interval not displayed.    OBJECTIVE:  VS:  HT:5\' 7"  (170.2 cm)   WT:200 lb (90.7 kg)  BMI:31.32    BP:140/88  HR:79bpm  TEMP: ( )  RESP:97 %   PHYSICAL EXAM: CONSTITUTIONAL: Well-developed, Well-nourished and In no acute distress EYES: Pupils are equal., EOM intact without nystagmus. and No scleral icterus. Psychiatric: Alert & appropriately interactive. and Not depressed or anxious appearing. EXTREMITY EXAM: Warm and well perfused  Thoracic wall is overall normal appearing he does palpable muscle spasm on the right paraspinal muscles.  He has restricted thoracic cage movement.  Poor thoracic  mobility.  Overhead range of motion of the shoulders is slightly limited as well.   ASSESSMENT:   1. Acute right-sided thoracic back pain   2. Somatic dysfunction of rib cage region     PROCEDURES:  PROCEDURE NOTE: THERAPEUTIC EXERCISES (16109)  Discussed the foundation of treatment for this condition is physical therapy and/or daily (5-6 days/week) therapeutic exercises, focusing on core strengthening, coordination, neuromuscular control/reeducation. 15 minutes spent for Therapeutic exercises as below and as referenced in the AVS. This included exercises focusing on stretching, strengthening, with significant focus on eccentric aspects.  Proper technique shown and discussed handout in great detail with ATC. All questions were discussed and answered.  Long term goals include an improvement in range of motion, strength, endurance as well as avoiding reinjury. Frequency of visits is one time as determined during today's office visit. Frequency of exercises to be performed is as per handout. EXERCISES REVIEWED: Scapular Stabilization Thoracic Mobility PROCEDURE NOTE: OSTEOPATHIC MANIPULATION  The decision today to treat with Osteopathic Manipulative Therapy (OMT) was based on physical exam findings. Verbal consent was obtained following a discussion with the patient regarding the of risks, benefits and potential side effects, including an acute pain flare,post manipulation soreness and need for repeat treatments.   Contraindications to OMT: NONE Manipulation was performed as below: Regions Treated & Osteopathic Exam Findings RIBS:  Rib 7 Right  Posterior  OMT Techniques Used: HVLA muscle energy myofascial release soft tissue  The patient tolerated the treatment well and reported Improved symptoms following treatment today. Patient was given medications, exercises, stretches and lifestyle modifications per AVS and verbally.      PLAN:  Pertinent additional documentation may be included  in corresponding procedure notes, imaging studies, problem based documentation and patient instructions.  Patient with functional thoracic back pain and restricted thoracic mobility leading to subluxed rib.  Home Therapeutic exercises prescribed today per procedure note.  Osteopathic manipulation was performed today based on physical exam findings.  Patient was counseled on the purpose and expected outcome of osteopathic manipulation and understands that a single treatment may not provide permanent long lasting relief.  They understand that home therapeutic exercises are critical part of the healing/treatment process and will continue with self treatment between now and their next visit as outlined.  The patient understands that the frequency of visits is meant to provide a stimulus to promote the body's own ability to heal and is not meant to be the sole means for improvement in their symptoms.  Activity modifications and the importance of avoiding exacerbating activities (limiting pain to no more  than a 4 / 10 during or following activity) recommended and discussed.  Discussed red flag symptoms that warrant earlier emergent evaluation and patient voices understanding.  Return if symptoms worsen or fail to improve.          Andrena MewsMichael D Rigby, DO    [place] [facility] Physician

## 2018-09-17 NOTE — Patient Instructions (Addendum)
Please perform the exercise program that we have prepared for you and gone over in detail on a daily basis.  In addition to the handout you were provided you can access your program through: [email or web address]   Your unique program code is:  [email or web address]

## 2018-09-17 NOTE — Progress Notes (Signed)
PROCEDURE NOTE : OSTEOPATHIC MANIPULATION The decision today to treat with Osteopathic Manipulative Therapy (OMT) was based on physical exam findings. Verbal consent was obtained following a discussion with the patient regarding the of risks, benefits and potential side effects, including an acute pain flare,post manipulation soreness and need for repeat treatments.     Contraindications to OMT: NONE  Manipulation was performed as below: Regions Treated & Osteopathic Exam Findings  Rib 8Right  Posterior   OMT Techniques Used  . HVLA . myofascial release    The patient tolerated the treatment well and reported Improved symptoms following treatment today. Patient was given medications, exercises, stretches and lifestyle modifications per AVS and verbally.

## 2018-09-20 ENCOUNTER — Ambulatory Visit: Payer: Medicare Other | Admitting: Internal Medicine

## 2018-09-23 ENCOUNTER — Telehealth: Payer: Self-pay | Admitting: Family Medicine

## 2018-09-23 DIAGNOSIS — J301 Allergic rhinitis due to pollen: Secondary | ICD-10-CM | POA: Diagnosis not present

## 2018-09-23 DIAGNOSIS — J3089 Other allergic rhinitis: Secondary | ICD-10-CM | POA: Diagnosis not present

## 2018-09-23 NOTE — Telephone Encounter (Signed)
Please advise on scheduling patient  Copied from CRM [id number]. 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 Earlene PlaterWallace considering his PB was 164/88 and he had blood in his urine. Patient states he can not see the blood in his urine and was unaware of it. He is wanting to be seen on 10/01/18 as his wife has a appointment that day as well. Dr. Earlene PlaterWallace first available is 10/16/17. Haley instructed me to send a crm and someone would contact him back. Please advise.

## 2018-09-24 NOTE — Telephone Encounter (Signed)
Okay to put him on the schedule with his wife as requested.

## 2018-09-24 NOTE — Telephone Encounter (Signed)
Pt will be scheduled and notified of appt.

## 2018-09-24 NOTE — Telephone Encounter (Signed)
Pt notified of new appt. Pt had an appt set up for 10/15/2018 regarding this issue. Pt appt has been canceled. No further action needed at this time!

## 2018-09-24 NOTE — Telephone Encounter (Signed)
Please advise 

## 2018-09-30 NOTE — Progress Notes (Signed)
Brendan Simpson is a 67 y.o. male is here for follow up.  History of Present Illness:   Brendan MortJoEllen Simpson, Brendan Simpson acting as scribe for Dr. Helane RimaErica Madlynn Lundeen.   HPI: Patient in office for follow up. Was seen at urgent care and told that he needed for follow up with PCP. Informed that he had blood in his urine and that his blood pressure was hight. 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 Cardiovascular ROS: no chest pain or dyspnea on exertion.  There are no preventive care reminders to display for this patient. Depression screen [facility] 2[facility] 03/20/2018 01/01/2017  Decreased Interest 0 0  Down, Depressed, Hopeless 0 0  PHQ - 2 Score 0 0   PMHx, SurgHx, SocialHx, FamHx, Medications, and Allergies were reviewed in the Visit Navigator and updated as appropriate.   Patient Active Problem List   Diagnosis Date Noted  . 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 10 year risk of MI or stroke 7.5% or greater 06/26/2018  . 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 Myalgia due to statin at Lipitor 40 03/24/2018  . Chronic left-sided low back pain with left-sided sciatica 03/20/2018  . Arthritis 02/03/2016  . Esophageal reflux 02/03/2016  . Gout 02/03/2016  . Benign essential hypertension 02/03/2016  . Hearing reduced, wears hearing aids bilaterally 02/03/2016  . Hypothyroidism 02/03/2016  . Hyperlipidemia 02/03/2016   Social History   Tobacco Use  . Smoking status: Never Smoker  . Smokeless tobacco: Never Used  Substance Use Topics  . Alcohol use: Yes    Alcohol/week: 0.0 standard drinks    Comment: occasioinal  . Drug use: No   Current Medications and Allergies:   Current Outpatient Medications:  .  allopurinol (ZYLOPRIM) 100 MG tablet, TAKE 1 TABLET BY MOUTH EVERY DAY, Disp: 90 tablet, Rfl: 2 .  amLODipine (NORVASC) 10 MG tablet, Take 1 tablet (10 mg total) by mouth daily., Disp: 90 tablet, Rfl: 2 .  aspirin EC 81 MG tablet, Take 81 mg by mouth daily., Disp: , Rfl:  .  azelaic  acid (AZELEX) 20 % cream, Apply topically as needed. After skin is thoroughly washed and patted dry, gently but thoroughly massage a thin film of azelaic acid cream into the affected area twice daily, in the morning and evening., Disp: 50 g, Rfl: 2 .  esomeprazole (NEXIUM) 40 MG capsule, Take 1 capsule (40 mg total) by mouth daily., Disp: 90 capsule, Rfl: 2 .  Glucosamine-Chondroit-Vit C-Mn (GLUCOSAMINE 1500 COMPLEX PO), Take 1 tablet by mouth daily., Disp: , Rfl:  .  levothyroxine (SYNTHROID, LEVOTHROID) 88 MCG tablet, Take 1 tablet (88 mcg total) by mouth daily before breakfast., Disp: 90 tablet, Rfl: 2 .  OVER THE COUNTER MEDICATION, Fish Oil, 1200 mg two capsules daily., Disp: , Rfl:  .  Probiotic Product (PROBIOTIC DAILY PO), Take 1 tablet by mouth daily., Disp: , Rfl:  .  rosuvastatin (CRESTOR) 40 MG tablet, Take 1 tablet (40 mg total) by mouth daily., Disp: 90 tablet, Rfl: 3   Allergies  Allergen Reactions  . Atorvastatin Other (See Comments)    Statin-myopathy   Review of Systems   Pertinent items are noted in the HPI. Otherwise, a complete ROS is negative.  Vitals:   Vitals:   10/01/18 0938  BP: 128/64  Pulse: 83  Temp: 98.6 F (37 C)  TempSrc: Oral  SpO2: 97%  Weight: 202 lb 3.2 oz (91.7 kg)  Height: 5\' 7"  (1.702 m)  Body mass index is 31.67 kg/m.  Physical Exam:   Physical Exam Vitals signs and nursing note reviewed.  Constitutional:      General: He is not in acute distress.    Appearance: He is well-developed.  HENT:     Head: Normocephalic and atraumatic.     Right Ear: External ear normal.     Left Ear: External ear normal.     Nose: Nose normal.  Eyes:     Conjunctiva/sclera: Conjunctivae normal.     Pupils: Pupils are equal, round, and reactive to light.  Neck:     Musculoskeletal: Neck supple.  Cardiovascular:     Rate and Rhythm: Normal rate and regular rhythm.  Pulmonary:     Effort: Pulmonary effort is normal.  Abdominal:     General:  Bowel sounds are normal.     Palpations: Abdomen is soft.  Musculoskeletal: Normal range of motion.  Skin:    General: Skin is warm.  Neurological:     Mental Status: He is alert.  Psychiatric:        Behavior: Behavior normal.     Results for orders placed or performed in visit on 10/01/18  POCT Urinalysis Dipstick (Automated)  Result Value Ref Range   Color, UA Yellow    Clarity, UA Clear    Glucose, UA Negative Negative   Bilirubin, UA Negative    Ketones, UA Negative    Spec Grav, UA 1.010 1.010 - 1.025   Blood, UA Negative    pH, UA 6.5 5.0 - 8.0   Protein, UA Negative Negative   Urobilinogen, UA 0.2 0.2 or 1.0 E.U./dL   Nitrite, UA Negative    Leukocytes, UA Negative Negative    Assessment and Plan:   Brendan HuaDavid was seen today for follow-up.  Diagnoses and all orders for this visit:  History of hematuria Comments: Resolved. Will monitor.  Orders: -     POCT Urinalysis Dipstick (Automated)  Benign essential hypertension Comments: At goal.   Pure hypercholesterolemia   . Orders and follow up as documented in EpicCare, reviewed diet, exercise and weight control, cardiovascular risk and specific lipid/LDL goals reviewed, reviewed medications and side effects in detail.  . Reviewed expectations re: course of current medical issues. . Outlined signs and symptoms indicating need for more acute intervention. . Patient verbalized understanding and all questions were answered. . Patient received an After Visit Summary.  Brendan Simpson served as Neurosurgeonscribe during this visit. History, Physical, and Plan performed by medical provider. The above documentation has been reviewed and is accurate and complete. Helane RimaErica Aikam Vinje, D.O.  Helane RimaErica Makenlee Mckeag, DO [place], [facility] 10/01/2018

## 2018-10-01 ENCOUNTER — Encounter: Payer: Self-pay | Admitting: Family Medicine

## 2018-10-01 ENCOUNTER — Ambulatory Visit (INDEPENDENT_AMBULATORY_CARE_PROVIDER_SITE_OTHER): Payer: Medicare Other | Admitting: Family Medicine

## 2018-10-01 VITALS — BP 128/64 | HR 83 | Temp 98.6°F | Ht 67.0 in | Wt 202.2 lb

## 2018-10-01 DIAGNOSIS — Z87448 Personal history of other diseases of urinary system: Secondary | ICD-10-CM | POA: Diagnosis not present

## 2018-10-01 DIAGNOSIS — E7800 Pure hypercholesterolemia, unspecified: Secondary | ICD-10-CM | POA: Diagnosis not present

## 2018-10-01 DIAGNOSIS — I10 Essential (primary) hypertension: Secondary | ICD-10-CM | POA: Diagnosis not present

## 2018-10-01 LAB — POC URINALSYSI DIPSTICK (AUTOMATED)
Bilirubin, UA: NEGATIVE
Blood, UA: NEGATIVE
Glucose, UA: NEGATIVE
Ketones, UA: NEGATIVE
Leukocytes, UA: NEGATIVE
Nitrite, UA: NEGATIVE
Protein, UA: NEGATIVE
Spec Grav, UA: 1.01 (ref 1.010–1.025)
Urobilinogen, UA: 0.2 E.U./dL
pH, UA: 6.5 (ref 5.0–8.0)

## 2018-10-15 ENCOUNTER — Ambulatory Visit: Payer: Medicare Other | Admitting: Family Medicine

## 2018-10-18 DIAGNOSIS — E785 Hyperlipidemia, unspecified: Secondary | ICD-10-CM | POA: Diagnosis not present

## 2018-10-19 LAB — HEPATIC FUNCTION PANEL
ALT: 28 IU/L (ref 0–44)
AST: 21 IU/L (ref 0–40)
Albumin: 4.8 g/dL (ref 3.6–4.8)
Alkaline Phosphatase: 58 IU/L (ref 39–117)
Bilirubin Total: 0.4 mg/dL (ref 0.0–1.2)
Bilirubin, Direct: 0.13 mg/dL (ref 0.00–0.40)
Total Protein: 7.3 g/dL (ref 6.0–8.5)

## 2018-10-19 LAB — LIPID PANEL
Chol/HDL Ratio: 3.2 ratio (ref 0.0–5.0)
Cholesterol, Total: 139 mg/dL (ref 100–199)
HDL: 44 mg/dL (ref >39)
LDL Calculated: 78 mg/dL (ref 0–99)
Triglycerides: 85 mg/dL (ref 0–149)
VLDL Cholesterol Cal: 17 mg/dL (ref 5–40)

## 2018-10-19 LAB — CK: Total CK: 429 U/L — ABNORMAL HIGH (ref 24–204)

## 2018-10-23 DIAGNOSIS — J3089 Other allergic rhinitis: Secondary | ICD-10-CM | POA: Diagnosis not present

## 2018-10-23 DIAGNOSIS — J301 Allergic rhinitis due to pollen: Secondary | ICD-10-CM | POA: Diagnosis not present

## 2018-10-24 ENCOUNTER — Encounter: Payer: Self-pay | Admitting: Internal Medicine

## 2018-10-24 ENCOUNTER — Ambulatory Visit (INDEPENDENT_AMBULATORY_CARE_PROVIDER_SITE_OTHER): Payer: Medicare Other | Admitting: Internal Medicine

## 2018-10-24 VITALS — BP 128/72 | HR 88 | Ht 67.0 in | Wt 205.6 lb

## 2018-10-24 DIAGNOSIS — G720 Drug-induced myopathy: Secondary | ICD-10-CM | POA: Diagnosis not present

## 2018-10-24 DIAGNOSIS — Z9189 Other specified personal risk factors, not elsewhere classified: Secondary | ICD-10-CM | POA: Diagnosis not present

## 2018-10-24 DIAGNOSIS — T466X5A Adverse effect of antihyperlipidemic and antiarteriosclerotic drugs, initial encounter: Secondary | ICD-10-CM

## 2018-10-24 DIAGNOSIS — E785 Hyperlipidemia, unspecified: Secondary | ICD-10-CM | POA: Diagnosis not present

## 2018-10-24 NOTE — Progress Notes (Signed)
OFFICE CONSULT NOTE  Chief Complaint:  Elevated triglyceride  Primary Care Physician: Helane RimaWallace, Erica, DO  HPI:  Brendan Simpson is a 68 y.o. male African American, who is being seen today for the evaluation of elevated triglycerides and high CK at the request of Helane RimaWallace, Erica, DO.  Brendan Simpson is 68 year old male with multiple medical problems including history of anxiety/depression, arthritis, cataracts, GERD, hypertension, and dyslipidemia.  He has been treated on atorvastatin at various doses over the years, initially at 10 and then 20 mg, but recently due to increased cholesterol had his atorvastatin dose increased to 40 mg.  His PCP has been following creatinine kinase because he has had a history of SAMS symptoms.  With regards to this, Brendan Simpson reports that he has had significant myalgias, worsening after 3 to 4 weeks recently with the dose increase, including typically the proximal muscle groups, hip flexors and calves, with nocturnal cramping and pain that develops oftentimes during sexual activity.  He was advised by his primary care provider to decrease the dose of his statin after recent lab work indicated an improvement in his lipid profile with a decrease in LDL cholesterol from 113-105 (and expected 6% decrease), however, his total CK increased from 394-646.  Based on these changes his dose was decreased back to 20 mg daily and a referral to the lipid clinic was placed.  Overall Brendan Simpson reports his SAMS symptoms have improved significantly.  According to the [facility] questionnaire he scored 11 suggesting highly likely statin associated muscle symptoms.  We reviewed the table of norms, for baseline CK across the population.  It should be understood that African-American males typically have higher CK values.  The median serum CK in African-American males is 213 as compared to Caucasian males which is 143.  Normal distribution is between 71 and 801.  That being said, an elevated CK is  not unusual in African-American males, but the increase in CK and associated SAMS symptoms suggest statin myopathy.  I also assessed his other medications to check whether there are any significant hepatic interactions and there does not appear to be.  Atorvastatin is actually commonly associated with this as it is a potent CYP 3 A4 inducer.  10/24/2018  Brendan Simpson returns today for follow-up.  Overall he seems to be doing well.  He is tolerated rosuvastatin much better than he did atorvastatin.  He has had a marked reduction in his lipid profile.  His total cholesterol is now 139 with triglycerides 85 (reduced from 184), HDL 44 and LDL of 78 (reduced from 181).  He has normal liver enzymes.  His CK is up slightly to 429 from 368 however down significantly from 646 previously.  He denies any significant SAMS symptoms although still does get occasional cramping in the backs of his legs during sexual activity.  He also has some numbness in the left lateral lower leg and has a history of lumbar disc disease in the past as well.  PMHx:  Past Medical History:  Diagnosis Date  . Allergy   . Arthritis   . Cataract   . GERD (gastroesophageal reflux disease)   . Gout   . Hyperlipidemia   . Hypertension   . Hypothyroidism 02/03/2016  . Uses hearing aid     Past Surgical History:  Procedure Laterality Date  . CARPAL TUNNEL RELEASE Bilateral   . CATARACT EXTRACTION, BILATERAL Bilateral   . KNEE ARTHROSCOPY Left     FAMHx:  Family History  Problem Relation Age of Onset  . Breast cancer Mother   . Heart disease Father   . Diabetes Sister   . Diabetes Brother   . Colon cancer Neg Hx   . Esophageal cancer Neg Hx   . Rectal cancer Neg Hx   . Stomach cancer Neg Hx     SOCHx:   reports that he has never smoked. He has never used smokeless tobacco. He reports current alcohol use. He reports that he does not use drugs.  ALLERGIES:  Allergies  Allergen Reactions  . Atorvastatin Other (See  Comments)    Statin-myopathy    ROS: Pertinent items noted in HPI and remainder of comprehensive ROS otherwise negative.  HOME MEDS: Current Outpatient Medications on File Prior to Visit  Medication Sig Dispense Refill  . allopurinol (ZYLOPRIM) 100 MG tablet TAKE 1 TABLET BY MOUTH EVERY DAY 90 tablet 2  . amLODipine (NORVASC) 10 MG tablet Take 1 tablet (10 mg total) by mouth daily. 90 tablet 2  . aspirin EC 81 MG tablet Take 81 mg by mouth daily.    Marland Kitchen azelaic acid (AZELEX) 20 % cream Apply topically as needed. After skin is thoroughly washed and patted dry, gently but thoroughly massage a thin film of azelaic acid cream into the affected area twice daily, in the morning and evening. 50 g 2  . esomeprazole (NEXIUM) 40 MG capsule Take 1 capsule (40 mg total) by mouth daily. 90 capsule 2  . Glucosamine-Chondroit-Vit C-Mn (GLUCOSAMINE 1500 COMPLEX PO) Take 1 tablet by mouth daily.    Marland Kitchen levothyroxine (SYNTHROID, LEVOTHROID) 88 MCG tablet Take 1 tablet (88 mcg total) by mouth daily before breakfast. 90 tablet 2  . OVER THE COUNTER MEDICATION Fish Oil, 1200 mg two capsules daily.    . Probiotic Product (PROBIOTIC DAILY PO) Take 1 tablet by mouth daily.    . rosuvastatin (CRESTOR) 40 MG tablet Take 1 tablet (40 mg total) by mouth daily. 90 tablet 3   No current facility-administered medications on file prior to visit.     LABS/IMAGING: No results found for this or any previous visit (from the past 48 hour(s)). No results found.  LIPID PANEL:    Component Value Date/Time   CHOL 139 10/18/2018 0956   TRIG 85 10/18/2018 0956   HDL 44 10/18/2018 0956   CHOLHDL 3.2 10/18/2018 0956   CHOLHDL 6 07/25/2018 1022   VLDL 36.8 07/25/2018 1022   LDLCALC 78 10/18/2018 0956   LDLDIRECT 102.0 09/27/2017 0918    WEIGHTS: Wt Readings from Last 3 Encounters:  10/24/18 205 lb 9.6 oz (93.3 kg)  10/01/18 202 lb 3.2 oz (91.7 kg)  09/17/18 200 lb (90.7 kg)    VITALS: BP 128/72   Pulse 88   Ht 5'  7" (1.702 m)   Wt 205 lb 9.6 oz (93.3 kg)   BMI 32.20 kg/m   EXAM: Deferred  EKG: Deferred  ASSESSMENT: 1. 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 Intermediate 10-year coronary risk (goal LDL <100)  PLAN: 1.   Brendan Simpson has had significant improvement in his lipid profile and is well below goal LDL less than 100.  He has no significant SAMS symptoms on rosuvastatin.  His liver enzymes are normal.  CK is mildly increased to 429 from 368.  This seems to be variable and is not necessarily a significant concern especially given the knowledge that CK can be elevated in African-Americans.  Overall he is pleased with the medication and is tolerating it  well.  I encouraged him to follow-up with his PCP and perhaps he may need have further testing about his lower extremity neuropathy and leg cramping.  Follow-up with me as needed.  Chrystie NoseKenneth C. Lizet Kelso, MD, [facility]ollaFACC, FACP  [place]  [facility]  Medical Director of the [facility]  Cardiovascular Risk Reduction [facility] Diplomate of the American Board of Clinical Lipidology Attending Cardiologist  Direct Dial: [phone]  Fax: [phone]  Website:  [email or web address]  Velmer Woelfel C Adella Manolis 10/24/2018, 11:35 AM

## 2018-10-24 NOTE — Patient Instructions (Signed)
Medication Instructions:  Continue current medications If you need a refill on your cardiac medications before your next appointment, please call your pharmacy.   Follow-Up: as needed with Dr. Hilty   

## 2018-10-25 DIAGNOSIS — J3089 Other allergic rhinitis: Secondary | ICD-10-CM | POA: Diagnosis not present

## 2018-10-25 DIAGNOSIS — J301 Allergic rhinitis due to pollen: Secondary | ICD-10-CM | POA: Diagnosis not present

## 2018-10-31 DIAGNOSIS — J301 Allergic rhinitis due to pollen: Secondary | ICD-10-CM | POA: Diagnosis not present

## 2018-10-31 DIAGNOSIS — J3089 Other allergic rhinitis: Secondary | ICD-10-CM | POA: Diagnosis not present

## 2018-11-05 DIAGNOSIS — J3089 Other allergic rhinitis: Secondary | ICD-10-CM | POA: Diagnosis not present

## 2018-11-05 DIAGNOSIS — J301 Allergic rhinitis due to pollen: Secondary | ICD-10-CM | POA: Diagnosis not present

## 2018-11-09 ENCOUNTER — Encounter: Payer: Self-pay | Admitting: Sports Medicine

## 2018-11-12 DIAGNOSIS — J3089 Other allergic rhinitis: Secondary | ICD-10-CM | POA: Diagnosis not present

## 2018-11-12 DIAGNOSIS — J301 Allergic rhinitis due to pollen: Secondary | ICD-10-CM | POA: Diagnosis not present

## 2018-11-19 DIAGNOSIS — J301 Allergic rhinitis due to pollen: Secondary | ICD-10-CM | POA: Diagnosis not present

## 2018-11-19 DIAGNOSIS — J3089 Other allergic rhinitis: Secondary | ICD-10-CM | POA: Diagnosis not present

## 2018-11-27 DIAGNOSIS — J3089 Other allergic rhinitis: Secondary | ICD-10-CM | POA: Diagnosis not present

## 2018-11-27 DIAGNOSIS — J301 Allergic rhinitis due to pollen: Secondary | ICD-10-CM | POA: Diagnosis not present

## 2018-12-04 DIAGNOSIS — J301 Allergic rhinitis due to pollen: Secondary | ICD-10-CM | POA: Diagnosis not present

## 2018-12-04 DIAGNOSIS — J3089 Other allergic rhinitis: Secondary | ICD-10-CM | POA: Diagnosis not present

## 2018-12-06 ENCOUNTER — Ambulatory Visit (INDEPENDENT_AMBULATORY_CARE_PROVIDER_SITE_OTHER): Payer: Medicare Other | Admitting: Sports Medicine

## 2018-12-06 ENCOUNTER — Ambulatory Visit: Payer: Self-pay

## 2018-12-06 ENCOUNTER — Ambulatory Visit (INDEPENDENT_AMBULATORY_CARE_PROVIDER_SITE_OTHER): Payer: Medicare Other

## 2018-12-06 ENCOUNTER — Encounter: Payer: Self-pay | Admitting: Sports Medicine

## 2018-12-06 VITALS — BP 130/82 | HR 88 | Ht 67.0 in | Wt 202.0 lb

## 2018-12-06 DIAGNOSIS — G8929 Other chronic pain: Secondary | ICD-10-CM

## 2018-12-06 DIAGNOSIS — M25561 Pain in right knee: Secondary | ICD-10-CM

## 2018-12-06 DIAGNOSIS — M1711 Unilateral primary osteoarthritis, right knee: Secondary | ICD-10-CM | POA: Diagnosis not present

## 2018-12-06 DIAGNOSIS — M25562 Pain in left knee: Secondary | ICD-10-CM

## 2018-12-06 DIAGNOSIS — M1712 Unilateral primary osteoarthritis, left knee: Secondary | ICD-10-CM | POA: Diagnosis not present

## 2018-12-06 NOTE — Patient Instructions (Addendum)
You had an injection today.  Things to be aware of after injection are listed below: . You may experience no significant improvement or even a slight worsening in your symptoms during the first 24 to 48 hours.  After that we expect your symptoms to improve gradually over the next 2 weeks for the medicine to have its maximal effect.  You should continue to have improvement out to 6 weeks after your injection. . Dr. Rigby recommends icing the site of the injection for 20 minutes  1-2 times the day of your injection . You may shower but no swimming, tub bath or Jacuzzi for 24 hours. . If your bandage falls off this does not need to be replaced.  It is appropriate to remove the bandage after 4 hours. . You may resume light activities as tolerated unless otherwise directed per Dr. Rigby during your visit  POSSIBLE STEROID SIDE EFFECTS:  Side effects from injectable steroids tend to be less than when taken orally however you may experience some of the symptoms listed below.  If experienced these should only last for a short period of time. Change in menstrual flow  Edema (swelling)  Increased appetite Skin flushing (redness)  Skin rash/acne  Thrush (oral) Yeast vaginitis    Increased sweating  Depression Increased blood glucose levels Cramping and leg/calf  Euphoria (feeling happy)  POSSIBLE PROCEDURE SIDE EFFECTS: The side effects of the injection are usually fairly minimal however if you may experience some of the following side effects that are usually self-limited and will is off on their own.  If you are concerned please feel free to call the office with questions:  Increased numbness or tingling  Nausea or vomiting  Swelling or bruising at the injection site   Please call our office if if you experience any of the following symptoms over the next week as these can be signs of infection:   Fever greater than 100.5F  Significant swelling at the injection site  Significant redness or drainage  from the injection site  If after 2 weeks you are continuing to have worsening symptoms please call our office to discuss what the next appropriate actions should be including the potential for a return office visit or other diagnostic testing.   I recommend you obtained a compression sleeve to help with your joint problems. There are many options on the market however I recommend obtaining a knee Body Helix compression sleeve.  You can find information (including how to appropriate measure yourself for sizing) can be found at www.Body Helix.com.  Many of these products are health savings account (HSA) eligible.   You can use the compression sleeve at any time throughout the day but is most important to use while being active as well as for 2 hours post-activity.   It is appropriate to ice following activity with the compression sleeve in place.    

## 2018-12-06 NOTE — Procedures (Signed)
PROCEDURE NOTE:  Ultrasound Guided: Injection: Bilateral knee Images were obtained and interpreted by myself, Gaspar Bidding, DO  Images have been saved and stored to PACS system. Images obtained on: GE S7 Ultrasound machine    ULTRASOUND FINDINGS:  Large effusion on the left and medium on the right  DESCRIPTION OF PROCEDURE:  The patient's clinical condition is marked by substantial pain and/or significant functional disability. Other conservative therapy has not provided relief, is contraindicated, or not appropriate. There is a reasonable likelihood that injection will significantly improve the patient's pain and/or functional impairment.   After discussing the risks, benefits and expected outcomes of the injection and all questions were reviewed and answered, the patient wished to undergo the above named procedure.  Verbal consent was obtained.  The ultrasound was used to identify the target structure and adjacent neurovascular structures. The skin was then prepped in sterile fashion and the target structure was injected under direct visualization using sterile technique as below:  Bilateral injections performed under identical technique as below: PREP: Alcohol and Ethel Chloride APPROACH:superiolateral, single injection, 25g 1.5 in. INJECTATE: 3 cc 0.5% Marcaine and 1 cc 40mg /mL DepoMedrol ASPIRATE: None DRESSING: Band-Aid  Post procedural instructions including recommending icing and warning signs for infection were reviewed.    This procedure was well tolerated and there were no complications.   IMPRESSION: Succesful Ultrasound Guided: Injection

## 2018-12-06 NOTE — Progress Notes (Signed)
Brendan Simpson. [facility] at [facility] [phone]  Brendan Simpson - 68 y.o. male MRN [id number]  Date of birth: [birth year]  Visit Date: December 06, 2018  PCP: Helane Rima, DO   Referred by: Helane Rima, DO  SUBJECTIVE:  Chief Complaint  Patient presents with  . Right Knee - Initial Assessment    MRI R knee 06/25/2009  . Left Knee - Initial Assessment    HPI: Patient presents with 5 years of worsening bilateral knee pain that is actually been worsening over the past several months.  He has symptoms that bother him when going from sit to stand.  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  Symptoms are also worsened with prolonged walking and going up and down steps.  He previously had a arthroscopy on the left knee and did physical therapy and use the brace at that time but is no longer doing this.  He is experiencing clicking popping catching and locking but no full giving way or terminal locking.  He has been complaining to his wife who prompted him to come in today for evaluation.  REVIEW OF SYSTEMS: 12 point review of systems obtained and reviewed today that was pertinent only for occasional night sweats but no nighttime disturbances due to this issue.  He has occasional numbness in his bilateral lower extremities.  Mild lower extremity edema that is activity and diet dependent.  HISTORY:  Prior history reviewed and updated per electronic medical record.  Patient Active Problem List   Diagnosis Date Noted  . Right knee pain 12/06/2018    06/25/2009 MRI R knee IMPRESSION: 1.  Prominent oblique tear of the posterior horn and body of the medial meniscus. 2.  Degenerative change most notable in the medial and patellofemoral compartments. 3.  Small loose body just posterior to the root of the posterior horn of the medial meniscus. 4.  Synovitis.   . Statin myopathy 06/26/2018   . 10 year risk of MI or stroke 7.5% or greater 06/26/2018  . 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 Myalgia due to statin at Lipitor 40 03/24/2018  . Chronic left-sided low back pain with left-sided sciatica 03/20/2018    Work injury.    . Arthritis 02/03/2016  . Esophageal reflux 02/03/2016  . Gout 02/03/2016  . Benign essential hypertension 02/03/2016  . Hearing reduced, wears hearing aids bilaterally 02/03/2016  . Hypothyroidism 02/03/2016  . Hyperlipidemia 02/03/2016   Social History   Occupational History  . Occupation: Facilities manager: [employer]  Tobacco Use  . Smoking status: Never Smoker  . Smokeless tobacco: Never Used  Substance and Sexual Activity  . Alcohol use: Yes    Alcohol/week: 0.0 standard drinks    Comment: occasioinal  . Drug use: No  . Sexual activity: Yes    Partners: Female   Social History   Social History Narrative   [employer] 4 days per week. Aerobic and strength training. Christian.    Past Medical History:  Diagnosis Date  . Allergy   . Arthritis   . Cataract   . GERD (gastroesophageal reflux disease)   . Gout   . Hyperlipidemia   . Hypertension   . Hypothyroidism 02/03/2016  . Uses hearing aid    Past Surgical History:  Procedure Laterality Date  . CARPAL TUNNEL RELEASE Bilateral   . CATARACT EXTRACTION, BILATERAL Bilateral   .  KNEE ARTHROSCOPY Left    family history includes Breast cancer in his mother; Diabetes in his brother and sister; Heart disease in his father. There is no history of Colon cancer, Esophageal cancer, Rectal cancer, or Stomach cancer.  OBJECTIVE:  VS:  HT:5\' 7"  (170.2 cm)   WT:202 lb (91.6 kg)  BMI:31.63    BP:130/82  HR:88bpm  TEMP: ( )  RESP:96 %   PHYSICAL EXAM: CONSTITUTIONAL: Well-developed, Well-nourished and In no acute distress EYES: Pupils are equal., EOM intact without nystagmus. and No scleral icterus. Psychiatric: Alert & appropriately interactive. and Not depressed or anxious  appearing. EXTREMITY EXAM: Warm and well perfused  Bilateral knees are overall well aligned with generalized osteoarthritic bossing.  He has a trace effusion on the right and a moderate effusion on the left.  Positive patellar grind.  He is ligamentously stable to varus and valgus strain as well as anterior and posterior drawer bilaterally.  He does have general synovitis and pain with patellar facet palpation.   ASSESSMENT:  1. Chronic pain of both knees   2. Right knee pain, unspecified chronicity     PROCEDURES:  US Guided Injection per procedure note      PLAN:  Pertinent additional documentation may be included in corresponding procedure notes, imaging studies, problem based documentation and patient instructions.  No problem-specific Assessment & Plan notes found for this encounter.   We will go ahead and inject the knee per procedure note  Additionally we discussed the merits of compression and/or bracing and recommend prophylatic compression with activity.  Icing discussed PRN.  If persistent ongoing symptoms can consider repeat injections and viscous supplementation.  Activity modifications and the importance of avoiding exacerbating activities (limiting pain to no more than a 4 / 10 during or following activity) recommended and discussed.  Discussed red flag symptoms that warrant earlier emergent evaluation and patient voices understanding.   No orders of the defined types were placed in this encounter.  Lab Orders  No laboratory test(s) ordered today    Imaging Orders     DG Knee 1-2 Views Left     DG Knee AP/LAT W/Sunrise Right     [place] MSK POCT ULTRASOUND Referral Orders  No referral(s) requested today    At follow up will plan to consider: repeat corticosteroid injections and Visco-supplementation .  We will discuss this as well as possibility for Zilretta injections at follow-up if any lack of efficacy.  Return in about 10 weeks (around 02/14/2019).           Brendan Mews, DO    [facility]

## 2018-12-20 ENCOUNTER — Encounter: Payer: Self-pay | Admitting: Family Medicine

## 2018-12-20 ENCOUNTER — Other Ambulatory Visit: Payer: Self-pay

## 2018-12-20 ENCOUNTER — Ambulatory Visit (INDEPENDENT_AMBULATORY_CARE_PROVIDER_SITE_OTHER): Payer: Medicare Other | Admitting: Family Medicine

## 2018-12-20 VITALS — BP 130/80 | HR 90 | Temp 98.3°F | Ht 67.0 in | Wt 200.5 lb

## 2018-12-20 DIAGNOSIS — E7800 Pure hypercholesterolemia, unspecified: Secondary | ICD-10-CM | POA: Diagnosis not present

## 2018-12-20 DIAGNOSIS — E039 Hypothyroidism, unspecified: Secondary | ICD-10-CM

## 2018-12-20 DIAGNOSIS — J301 Allergic rhinitis due to pollen: Secondary | ICD-10-CM | POA: Insufficient documentation

## 2018-12-20 DIAGNOSIS — I10 Essential (primary) hypertension: Secondary | ICD-10-CM | POA: Diagnosis not present

## 2018-12-20 LAB — COMPREHENSIVE METABOLIC PANEL
ALT: 38 U/L (ref 0–53)
AST: 20 U/L (ref 0–37)
Albumin: 4.5 g/dL (ref 3.5–5.2)
Alkaline Phosphatase: 59 U/L (ref 39–117)
BUN: 12 mg/dL (ref 6–23)
CO2: 27 mEq/L (ref 19–32)
Calcium: 9.3 mg/dL (ref 8.4–10.5)
Chloride: 103 mEq/L (ref 96–112)
Creatinine, Ser: 1.09 mg/dL (ref 0.40–1.50)
GFR: 81.94 mL/min (ref >60.00)
Glucose, Bld: 166 mg/dL — ABNORMAL HIGH (ref 70–99)
Potassium: 3.8 mEq/L (ref 3.5–5.1)
Sodium: 140 mEq/L (ref 135–145)
Total Bilirubin: 0.4 mg/dL (ref 0.2–1.2)
Total Protein: 6.9 g/dL (ref 6.0–8.3)

## 2018-12-20 LAB — TSH: TSH: 0.83 u[IU]/mL (ref 0.35–4.50)

## 2018-12-20 LAB — T4, FREE: Free T4: 0.73 ng/dL (ref 0.60–1.60)

## 2018-12-20 MED ORDER — ROSUVASTATIN CALCIUM 40 MG PO TABS: 40.0000 mg | ORAL_TABLET | Freq: Every day | ORAL | 3 refills | Status: DC

## 2018-12-20 NOTE — Progress Notes (Signed)
Brendan Simpson is a 68 y.o. male is here for follow up.  History of Present Illness:   HPI: See Assessment and Plan section for Problem Based Charting of issues discussed today.   There are no preventive care reminders to display for this patient. Depression screen [facility] 2/9 12/20/2018 03/20/2018 01/01/2017  Decreased Interest 0 0 0  Down, Depressed, Hopeless 0 0 0  PHQ - 2 Score 0 0 0  Altered sleeping 0 - -  Tired, decreased energy 0 - -  Change in appetite 0 - -  Feeling bad or failure about yourself  0 - -  Trouble concentrating 0 - -  Moving slowly or fidgety/restless 0 - -  Suicidal thoughts 0 - -  PHQ-9 Score 0 - -  Difficult doing work/chores Not difficult at all - -   PMHx, SurgHx, SocialHx, FamHx, Medications, and Allergies were reviewed in the Visit Navigator and updated as appropriate.   Patient Active Problem List   Diagnosis Date Noted  . Seasonal allergic rhinitis due to pollen 12/20/2018  . Right knee pain 12/06/2018  . 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 10 year risk of MI or stroke 7.5% or greater 06/26/2018  . 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 Myalgia due to statin at Lipitor 40 03/24/2018  . Chronic left-sided low back pain with left-sided sciatica 03/20/2018  . Arthritis 02/03/2016  . Esophageal reflux 02/03/2016  . Gout 02/03/2016  . Benign essential hypertension 02/03/2016  . Hearing reduced, wears hearing aids bilaterally 02/03/2016  . Hypothyroidism 02/03/2016  . Hyperlipidemia 02/03/2016   Social History   Tobacco Use  . Smoking status: Never Smoker  . Smokeless tobacco: Never Used  Substance Use Topics  . Alcohol use: Yes    Alcohol/week: 0.0 standard drinks    Comment: occasioinal  . Drug use: No   Current Medications and Allergies:   Current Outpatient Medications:  .  allopurinol (ZYLOPRIM) 100 MG tablet, TAKE 1 TABLET BY MOUTH EVERY DAY, Disp: 90 tablet, Rfl: 2 .  amLODipine (NORVASC) 10 MG tablet, Take 1 tablet (10 mg total) by mouth  daily., Disp: 90 tablet, Rfl: 2 .  aspirin EC 81 MG tablet, Take 81 mg by mouth daily., Disp: , Rfl:  .  azelaic acid (AZELEX) 20 % cream, Apply topically as needed. After skin is thoroughly washed and patted dry, gently but thoroughly massage a thin film of azelaic acid cream into the affected area twice daily, in the morning and evening., Disp: 50 g, Rfl: 2 .  esomeprazole (NEXIUM) 40 MG capsule, Take 1 capsule (40 mg total) by mouth daily., Disp: 90 capsule, Rfl: 2 .  Glucosamine-Chondroit-Vit C-Mn (GLUCOSAMINE 1500 COMPLEX PO), Take 1 tablet by mouth daily., Disp: , Rfl:  .  levothyroxine (SYNTHROID, LEVOTHROID) 88 MCG tablet, Take 1 tablet (88 mcg total) by mouth daily before breakfast., Disp: 90 tablet, Rfl: 2 .  Oxymetazoline HCl (NASAL SPRAY 12 HOUR NA), Place 1 spray into both nostrils as needed. , Disp: , Rfl:  .  Probiotic Product (PROBIOTIC DAILY PO), Take 1 tablet by mouth daily., Disp: , Rfl:  .  rosuvastatin (CRESTOR) 40 MG tablet, Take 1 tablet (40 mg total) by mouth daily., Disp: 90 tablet, Rfl: 3   Allergies  Allergen Reactions  . Atorvastatin Other (See Comments)    Statin-myopathy   Review of Systems   Pertinent items are noted in the HPI. Otherwise, ROS is negative.  Vitals:   Vitals:   12/20/18 1018  BP: 130/80  Pulse: 90  Temp: 98.3 F (36.8 C)  TempSrc: Oral  SpO2: 97%  Weight: 200 lb 8 oz (90.9 kg)  Height: 5\' 7"  (1.702 m)     Body mass index is 31.4 kg/m.  Physical Exam:   Physical Exam Vitals signs and nursing note reviewed.  Constitutional:      General: He is not in acute distress.    Appearance: He is well-developed.  HENT:     Head: Normocephalic and atraumatic.     Right Ear: External ear normal.     Left Ear: External ear normal.     Nose: Nose normal.  Eyes:     Conjunctiva/sclera: Conjunctivae normal.     Pupils: Pupils are equal, round, and reactive to light.  Neck:     Musculoskeletal: Neck supple.  Cardiovascular:     Rate  and Rhythm: Normal rate and regular rhythm.  Pulmonary:     Effort: Pulmonary effort is normal.  Abdominal:     General: Bowel sounds are normal.     Palpations: Abdomen is soft.  Musculoskeletal: Normal range of motion.  Skin:    General: Skin is warm.  Neurological:     Mental Status: He is alert.  Psychiatric:        Behavior: Behavior normal.    Assessment and Plan:   Brendan Simpson was seen today for hypertension.  Diagnoses and all orders for this visit:  Benign essential hypertension Comments: Doing well.  Continue current treatment.  Acquired hypothyroidism Comments: Doing well today.  Euthyroid on exam.  Requires labs. Orders: -     TSH -     T4, free  Pure hypercholesterolemia Comments: Doing well.  Continue current treatment.  Refill today. Orders: -     rosuvastatin (CRESTOR) 40 MG tablet; Take 1 tablet (40 mg total) by mouth daily. -     Comprehensive metabolic panel  Seasonal allergic rhinitis due to pollen Comments: Added to chart.  Patient gets allergy injections at [facility].    . Reviewed expectations re: course of current medical issues. . Discussed self-management of symptoms. . Outlined signs and symptoms indicating need for more acute intervention. . Patient verbalized understanding and all questions were answered. Marland Kitchen Health Maintenance issues including appropriate healthy diet, exercise, and smoking avoidance were discussed with patient. . See orders for this visit as documented in the electronic medical record. . Patient received an After Visit Summary.  Helane Rima, DO [place], [place] [facility] 12/20/2018

## 2019-01-02 DIAGNOSIS — J3089 Other allergic rhinitis: Secondary | ICD-10-CM | POA: Diagnosis not present

## 2019-01-02 DIAGNOSIS — J301 Allergic rhinitis due to pollen: Secondary | ICD-10-CM | POA: Diagnosis not present

## 2019-02-06 DIAGNOSIS — J301 Allergic rhinitis due to pollen: Secondary | ICD-10-CM | POA: Diagnosis not present

## 2019-02-06 DIAGNOSIS — J3089 Other allergic rhinitis: Secondary | ICD-10-CM | POA: Diagnosis not present

## 2019-02-14 ENCOUNTER — Ambulatory Visit: Payer: Medicare Other | Admitting: Sports Medicine

## 2019-02-23 ENCOUNTER — Other Ambulatory Visit: Payer: Self-pay | Admitting: Family Medicine

## 2019-03-06 DIAGNOSIS — J301 Allergic rhinitis due to pollen: Secondary | ICD-10-CM | POA: Diagnosis not present

## 2019-03-06 DIAGNOSIS — J3089 Other allergic rhinitis: Secondary | ICD-10-CM | POA: Diagnosis not present

## 2019-03-11 DIAGNOSIS — J3089 Other allergic rhinitis: Secondary | ICD-10-CM | POA: Diagnosis not present

## 2019-03-11 DIAGNOSIS — J301 Allergic rhinitis due to pollen: Secondary | ICD-10-CM | POA: Diagnosis not present

## 2019-03-19 ENCOUNTER — Telehealth: Payer: Self-pay | Admitting: Family Medicine

## 2019-03-19 NOTE — Telephone Encounter (Signed)
Pt said his appt is supposed to be a CPE because his wife has a CPE scheduled right after him. I informed pt I would send a message and see if both patients could get cpe this day. Please advise.

## 2019-03-20 NOTE — Telephone Encounter (Signed)
That is fine can you add to note that both need to be AWV

## 2019-03-21 ENCOUNTER — Telehealth: Payer: Self-pay | Admitting: Physical Therapy

## 2019-03-21 NOTE — Telephone Encounter (Signed)
Copied from [place] [id number]. Topic: Appointment Scheduling - Scheduling Inquiry for Clinic >> Mar 21, 2019 11:39 AM Nils Flack wrote: Reason for CRM: pt returning call, he has appt on mon  Please call cell [phone]

## 2019-03-24 ENCOUNTER — Other Ambulatory Visit: Payer: Self-pay

## 2019-03-24 ENCOUNTER — Encounter: Payer: Self-pay | Admitting: Family Medicine

## 2019-03-24 ENCOUNTER — Ambulatory Visit (INDEPENDENT_AMBULATORY_CARE_PROVIDER_SITE_OTHER): Payer: Medicare Other | Admitting: Family Medicine

## 2019-03-24 VITALS — BP 140/88 | HR 91 | Temp 98.6°F | Ht 67.0 in | Wt 203.5 lb

## 2019-03-24 DIAGNOSIS — K0889 Other specified disorders of teeth and supporting structures: Secondary | ICD-10-CM

## 2019-03-24 DIAGNOSIS — M25562 Pain in left knee: Secondary | ICD-10-CM | POA: Insufficient documentation

## 2019-03-24 DIAGNOSIS — Z0000 Encounter for general adult medical examination without abnormal findings: Secondary | ICD-10-CM

## 2019-03-24 DIAGNOSIS — E7800 Pure hypercholesterolemia, unspecified: Secondary | ICD-10-CM | POA: Diagnosis not present

## 2019-03-24 MED ORDER — ROSUVASTATIN CALCIUM 40 MG PO TABS: 40.0000 mg | ORAL_TABLET | Freq: Every day | ORAL | 3 refills | Status: DC

## 2019-03-24 MED ORDER — AMOXICILLIN 875 MG PO TABS: 875.0000 mg | ORAL_TABLET | Freq: Two times a day (BID) | ORAL | 0 refills | Status: DC

## 2019-03-24 NOTE — Progress Notes (Signed)
Subjective:    Brendan Simpson is a 68 y.o. male who presents for Medicare Annual (Subsequent) preventive examination.  Right ear pain. Root canal tooth #31. Has another appointment next week with dentist. Cannot wear hearing aid due to discomfort.   Muscle spasms. During stretching or intercourse. Feels like a [employer]. Lipid Clinic said not due to statin myopathy.  Review of Systems  Constitutional: Negative for chills, fever, malaise/fatigue and weight loss.  HENT: Positive for ear pain.   Respiratory: Negative for cough, shortness of breath and wheezing.   Cardiovascular: Negative for chest pain, palpitations and leg swelling.  Gastrointestinal: Negative for abdominal pain, constipation, diarrhea, nausea and vomiting.  Genitourinary: Negative for dysuria and urgency.  Musculoskeletal: Positive for myalgias. Negative for joint pain.  Skin: Negative for rash.  Neurological: Negative for dizziness and headaches.  Psychiatric/Behavioral: Negative for depression, substance abuse and suicidal ideas. The patient is not nervous/anxious.    Objective:   Vitals: BP 140/88 (Cuff Size: Normal)   Pulse 91   Temp 98.6 F (37 C) (Oral)   Ht 5\' 7"  (1.702 m)   Wt 203 lb 8 oz (92.3 kg)   SpO2 98%   BMI 31.87 kg/m   Body mass index is 31.87 kg/m.  Physical Exam Vitals signs and nursing note reviewed.  Constitutional:      General: He is not in acute distress.    Appearance: He is well-developed.  HENT:     Head: Normocephalic and atraumatic.     Right Ear: External ear normal.     Left Ear: External ear normal.     Nose: Nose normal.  Eyes:     Conjunctiva/sclera: Conjunctivae normal.     Pupils: Pupils are equal, round, and reactive to light.  Neck:     Musculoskeletal: Neck supple.  Cardiovascular:     Rate and Rhythm: Normal rate and regular rhythm.  Pulmonary:     Effort: Pulmonary effort is normal.  Abdominal:     General: Bowel sounds are normal.   Palpations: Abdomen is soft.  Musculoskeletal: Normal range of motion.  Skin:    General: Skin is warm.  Neurological:     Mental Status: He is alert.  Psychiatric:        Behavior: Behavior normal.    Social History   Tobacco Use  Smoking Status Never Smoker  Smokeless Tobacco Never Used     Patient Active Problem List   Diagnosis Date Noted  . Left knee pain 03/24/2019  . Seasonal allergic rhinitis due to pollen 12/20/2018  . Right knee pain 12/06/2018  . 10 year risk of MI or stroke 7.5% or greater 06/26/2018  . 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 Chronic left-sided low back pain with left-sided sciatica 03/20/2018  . Arthritis 02/03/2016  . Esophageal reflux 02/03/2016  . Gout 02/03/2016  . Benign essential hypertension 02/03/2016  . Hearing reduced, wears hearing aids bilaterally 02/03/2016  . Hypothyroidism 02/03/2016  . Hyperlipidemia 02/03/2016   Past Surgical History:  Procedure Laterality Date  . CARPAL TUNNEL RELEASE Bilateral   . CATARACT EXTRACTION, BILATERAL Bilateral   . KNEE ARTHROSCOPY Left    Family History  Problem Relation Age of Onset  . Breast cancer Mother   . Heart disease Father   . Diabetes Sister   . Diabetes Brother   . Colon cancer Neg Hx   . Esophageal cancer Neg Hx   . Rectal cancer Neg Hx   . Stomach cancer Neg Hx  Social History   Tobacco Use  . Smoking status: Never Smoker  . Smokeless tobacco: Never Used  Substance Use Topics  . Alcohol use: Yes    Alcohol/week: 0.0 standard drinks    Comment: occasioinal  . Drug use: No    Current Outpatient Medications:  .  allopurinol (ZYLOPRIM) 100 MG tablet, TAKE 1 TABLET BY MOUTH EVERY DAY, Disp: 90 tablet, Rfl: 2 .  amLODipine (NORVASC) 10 MG tablet, Take 1 tablet (10 mg total) by mouth daily., Disp: 90 tablet, Rfl: 2 .  aspirin EC 81 MG tablet, Take 81 mg by mouth daily., Disp: , Rfl:  .  azelaic acid (AZELEX) 20 % cream, Apply topically as needed. After skin is thoroughly  washed and patted dry, gently but thoroughly massage a thin film of azelaic acid cream into the affected area twice daily, in the morning and evening., Disp: 50 g, Rfl: 2 .  esomeprazole (NEXIUM) 40 MG capsule, Take 1 capsule (40 mg total) by mouth daily., Disp: 90 capsule, Rfl: 2 .  Glucosamine-Chondroit-Vit C-Mn (GLUCOSAMINE 1500 COMPLEX PO), Take 1 tablet by mouth daily., Disp: , Rfl:  .  levothyroxine (SYNTHROID, LEVOTHROID) 88 MCG tablet, Take 1 tablet (88 mcg total) by mouth daily before breakfast., Disp: 90 tablet, Rfl: 2 .  Oxymetazoline HCl (NASAL SPRAY 12 HOUR NA), Place 1 spray into both nostrils as needed. , Disp: , Rfl:  .  Probiotic Product (PROBIOTIC DAILY PO), Take 1 tablet by mouth daily., Disp: , Rfl:  .  amoxicillin (AMOXIL) 875 MG tablet, Take 1 tablet (875 mg total) by mouth 2 (two) times daily., Disp: 20 tablet, Rfl: 0 .  rosuvastatin (CRESTOR) 40 MG tablet, Take 1 tablet (40 mg total) by mouth daily., Disp: 90 tablet, Rfl: 3  Activities of Daily Living In your present state of health, do you have any difficulty performing the following activities: 03/26/2019  Hearing? N  Vision? N  Difficulty concentrating or making decisions? N  Walking or climbing stairs? N  Dressing or bathing? N  Doing errands, shopping? N  Preparing Food and eating ? N  Using the Toilet? N  In the past six months, have you accidently leaked urine? N  Do you have problems with loss of bowel control? N  Managing your Medications? N  Managing your Finances? N  Housekeeping or managing your Housekeeping? N  Some recent data might be hidden    Patient Care Team: Helane RimaWallace, Lilliahna Schubring, DO as PCP - General (Family Medicine) Irena CordsVan Winkle, Enzo Montgomeryobert C, MD as Consulting Physician (Allergy and Immunology)   Assessment:   Brendan Simpson for annual exam.  Diagnoses and all orders for this visit:  Medicare annual wellness visit, subsequent  Pure hypercholesterolemia Comments: Doing well.  Continue  current treatment.  Refill Simpson. Orders: -     rosuvastatin (CRESTOR) 40 MG tablet; Take 1 tablet (40 mg total) by mouth daily.  Dentalgia -     amoxicillin (AMOXIL) 875 MG tablet; Take 1 tablet (875 mg total) by mouth 2 (two) times daily.   Exercise Activities and Dietary recommendations Current Exercise Habits: Home exercise routine, Type of exercise: walking, Time (Minutes): 30, Frequency (Times/Week): 5, Weekly Exercise (Minutes/Week): 150, Intensity: Moderate, Exercise limited by: Other - see comments([employer] closed due to COVID)  Fall Risk Fall Risk  03/24/2019 03/20/2018  Falls in the past year? 0 No  Number falls in past yr: 0 -  Injury with Fall? 0 -   Timed Get Up and Go performed:  yes, taking < 12 seconds  Depression Screen PHQ 2/9 Scores 12/20/2018 03/20/2018 01/01/2017  PHQ - 2 Score 0 0 0  PHQ- 9 Score 0 - -    Cognitive Function Mini-cog completed. No concerns. Will scan.  Immunization History  Administered Date(s) Administered  . Influenza, High Dose Seasonal PF 06/21/2018  . Influenza,inj,Quad PF,6+ Mos 07/03/2015, 06/05/2017, 06/05/2017, 07/19/2017  . Pneumococcal Conjugate-13 09/16/2015  . Pneumococcal Polysaccharide-23 03/20/2018  . Tdap 09/19/2006, 03/19/2017  . Zoster 03/05/2013  . Zoster Recombinat (Shingrix) 03/30/2018, 07/08/2018   Screening Tests Health Maintenance  Topic Date Due  . INFLUENZA VACCINE  05/03/2019  . TETANUS/TDAP  03/20/2027  . COLONOSCOPY  05/13/2028  . Hepatitis C Screening  Completed  . PNA vac Low Risk Adult  Completed   Plan:   I have personally reviewed and noted the following in the patient's chart:   . Medical and social history . Use of alcohol, tobacco or illicit drugs  . Current medications and supplements . Functional ability and status . Nutritional status . Physical activity . Advanced directives . List of other physicians . Hospitalizations, surgeries, and ER visits in previous 12 months . Vitals . Screenings  to include cognitive, depression, and falls . Referrals and appointments  In addition, I have reviewed and discussed with patient certain preventive protocols, quality metrics, and best practice recommendations. A written personalized care plan for preventive services as well as general preventive health recommendations were provided to patient. AMW QUESTIONS ANSWERED AND SCANNED INTO CHART UNDER MEDIA SECTION.  Briscoe Deutscher, DO

## 2019-03-24 NOTE — Patient Instructions (Signed)
There are no preventive care reminders to display for this patient.  Depression screen [facility] 2/9 12/20/2018 03/20/2018 01/01/2017  Decreased Interest 0 0 0  Down, Depressed, Hopeless 0 0 0  PHQ - 2 Score 0 0 0  Altered sleeping 0 - -  Tired, decreased energy 0 - -  Change in appetite 0 - -  Feeling bad or failure about yourself  0 - -  Trouble concentrating 0 - -  Moving slowly or fidgety/restless 0 - -  Suicidal thoughts 0 - -  PHQ-9 Score 0 - -  Difficult doing work/chores Not difficult at all - -

## 2019-03-27 DIAGNOSIS — H40003 Preglaucoma, unspecified, bilateral: Secondary | ICD-10-CM | POA: Diagnosis not present

## 2019-03-27 DIAGNOSIS — Z961 Presence of intraocular lens: Secondary | ICD-10-CM | POA: Diagnosis not present

## 2019-03-27 DIAGNOSIS — H43393 Other vitreous opacities, bilateral: Secondary | ICD-10-CM | POA: Diagnosis not present

## 2019-04-02 ENCOUNTER — Other Ambulatory Visit: Payer: Self-pay | Admitting: Family Medicine

## 2019-04-02 DIAGNOSIS — I10 Essential (primary) hypertension: Secondary | ICD-10-CM

## 2019-04-03 DIAGNOSIS — J301 Allergic rhinitis due to pollen: Secondary | ICD-10-CM | POA: Diagnosis not present

## 2019-04-03 DIAGNOSIS — J3089 Other allergic rhinitis: Secondary | ICD-10-CM | POA: Diagnosis not present

## 2019-04-10 DIAGNOSIS — J301 Allergic rhinitis due to pollen: Secondary | ICD-10-CM | POA: Diagnosis not present

## 2019-04-10 DIAGNOSIS — J3089 Other allergic rhinitis: Secondary | ICD-10-CM | POA: Diagnosis not present

## 2019-04-17 DIAGNOSIS — J3089 Other allergic rhinitis: Secondary | ICD-10-CM | POA: Diagnosis not present

## 2019-04-17 DIAGNOSIS — J301 Allergic rhinitis due to pollen: Secondary | ICD-10-CM | POA: Diagnosis not present

## 2019-04-22 DIAGNOSIS — J301 Allergic rhinitis due to pollen: Secondary | ICD-10-CM | POA: Diagnosis not present

## 2019-04-22 DIAGNOSIS — J3089 Other allergic rhinitis: Secondary | ICD-10-CM | POA: Diagnosis not present

## 2019-04-24 DIAGNOSIS — J3089 Other allergic rhinitis: Secondary | ICD-10-CM | POA: Diagnosis not present

## 2019-04-24 DIAGNOSIS — J301 Allergic rhinitis due to pollen: Secondary | ICD-10-CM | POA: Diagnosis not present

## 2019-04-30 DIAGNOSIS — E291 Testicular hypofunction: Secondary | ICD-10-CM | POA: Diagnosis not present

## 2019-04-30 DIAGNOSIS — R635 Abnormal weight gain: Secondary | ICD-10-CM | POA: Diagnosis not present

## 2019-05-01 ENCOUNTER — Other Ambulatory Visit: Payer: Self-pay

## 2019-05-01 DIAGNOSIS — J3089 Other allergic rhinitis: Secondary | ICD-10-CM | POA: Diagnosis not present

## 2019-05-01 DIAGNOSIS — R6889 Other general symptoms and signs: Secondary | ICD-10-CM | POA: Diagnosis not present

## 2019-05-01 DIAGNOSIS — J301 Allergic rhinitis due to pollen: Secondary | ICD-10-CM | POA: Diagnosis not present

## 2019-05-01 DIAGNOSIS — Z20822 Contact with and (suspected) exposure to covid-19: Secondary | ICD-10-CM

## 2019-05-04 LAB — NOVEL CORONAVIRUS, NAA: SARS-CoV-2, NAA: NOT DETECTED

## 2019-05-05 ENCOUNTER — Other Ambulatory Visit: Payer: Self-pay | Admitting: Family Medicine

## 2019-05-05 DIAGNOSIS — E039 Hypothyroidism, unspecified: Secondary | ICD-10-CM

## 2019-05-06 DIAGNOSIS — Z6831 Body mass index (BMI) 31.0-31.9, adult: Secondary | ICD-10-CM | POA: Diagnosis not present

## 2019-05-06 DIAGNOSIS — K219 Gastro-esophageal reflux disease without esophagitis: Secondary | ICD-10-CM | POA: Diagnosis not present

## 2019-05-06 DIAGNOSIS — Z1339 Encounter for screening examination for other mental health and behavioral disorders: Secondary | ICD-10-CM | POA: Diagnosis not present

## 2019-05-06 DIAGNOSIS — E663 Overweight: Secondary | ICD-10-CM | POA: Diagnosis not present

## 2019-05-06 DIAGNOSIS — E039 Hypothyroidism, unspecified: Secondary | ICD-10-CM | POA: Diagnosis not present

## 2019-05-06 DIAGNOSIS — E291 Testicular hypofunction: Secondary | ICD-10-CM | POA: Diagnosis not present

## 2019-05-06 DIAGNOSIS — E8881 Metabolic syndrome: Secondary | ICD-10-CM | POA: Diagnosis not present

## 2019-05-06 DIAGNOSIS — R5383 Other fatigue: Secondary | ICD-10-CM | POA: Diagnosis not present

## 2019-05-06 DIAGNOSIS — Z1331 Encounter for screening for depression: Secondary | ICD-10-CM | POA: Diagnosis not present

## 2019-05-06 DIAGNOSIS — R6882 Decreased libido: Secondary | ICD-10-CM | POA: Diagnosis not present

## 2019-05-06 DIAGNOSIS — I10 Essential (primary) hypertension: Secondary | ICD-10-CM | POA: Diagnosis not present

## 2019-05-06 DIAGNOSIS — E782 Mixed hyperlipidemia: Secondary | ICD-10-CM | POA: Diagnosis not present

## 2019-05-07 DIAGNOSIS — J3089 Other allergic rhinitis: Secondary | ICD-10-CM | POA: Diagnosis not present

## 2019-05-07 DIAGNOSIS — J301 Allergic rhinitis due to pollen: Secondary | ICD-10-CM | POA: Diagnosis not present

## 2019-05-08 DIAGNOSIS — Z6831 Body mass index (BMI) 31.0-31.9, adult: Secondary | ICD-10-CM | POA: Diagnosis not present

## 2019-05-08 DIAGNOSIS — I10 Essential (primary) hypertension: Secondary | ICD-10-CM | POA: Diagnosis not present

## 2019-05-13 DIAGNOSIS — J3089 Other allergic rhinitis: Secondary | ICD-10-CM | POA: Diagnosis not present

## 2019-05-13 DIAGNOSIS — J301 Allergic rhinitis due to pollen: Secondary | ICD-10-CM | POA: Diagnosis not present

## 2019-05-15 DIAGNOSIS — Z6831 Body mass index (BMI) 31.0-31.9, adult: Secondary | ICD-10-CM | POA: Diagnosis not present

## 2019-05-15 DIAGNOSIS — E8881 Metabolic syndrome: Secondary | ICD-10-CM | POA: Diagnosis not present

## 2019-05-21 DIAGNOSIS — J301 Allergic rhinitis due to pollen: Secondary | ICD-10-CM | POA: Diagnosis not present

## 2019-05-21 DIAGNOSIS — J3089 Other allergic rhinitis: Secondary | ICD-10-CM | POA: Diagnosis not present

## 2019-05-22 DIAGNOSIS — Z6830 Body mass index (BMI) 30.0-30.9, adult: Secondary | ICD-10-CM | POA: Diagnosis not present

## 2019-05-22 DIAGNOSIS — E782 Mixed hyperlipidemia: Secondary | ICD-10-CM | POA: Diagnosis not present

## 2019-05-23 ENCOUNTER — Other Ambulatory Visit: Payer: Self-pay | Admitting: Family Medicine

## 2019-05-23 DIAGNOSIS — K219 Gastro-esophageal reflux disease without esophagitis: Secondary | ICD-10-CM

## 2019-05-23 NOTE — Telephone Encounter (Signed)
Last fill 06/21/18  #90/2 Last OV 03/24/19

## 2019-05-30 DIAGNOSIS — J301 Allergic rhinitis due to pollen: Secondary | ICD-10-CM | POA: Diagnosis not present

## 2019-05-30 DIAGNOSIS — J3089 Other allergic rhinitis: Secondary | ICD-10-CM | POA: Diagnosis not present

## 2019-06-02 DIAGNOSIS — I10 Essential (primary) hypertension: Secondary | ICD-10-CM | POA: Diagnosis not present

## 2019-06-02 DIAGNOSIS — Z6830 Body mass index (BMI) 30.0-30.9, adult: Secondary | ICD-10-CM | POA: Diagnosis not present

## 2019-06-02 DIAGNOSIS — K219 Gastro-esophageal reflux disease without esophagitis: Secondary | ICD-10-CM | POA: Diagnosis not present

## 2019-06-05 DIAGNOSIS — J3089 Other allergic rhinitis: Secondary | ICD-10-CM | POA: Diagnosis not present

## 2019-06-05 DIAGNOSIS — Z23 Encounter for immunization: Secondary | ICD-10-CM | POA: Diagnosis not present

## 2019-06-05 DIAGNOSIS — J301 Allergic rhinitis due to pollen: Secondary | ICD-10-CM | POA: Diagnosis not present

## 2019-06-12 DIAGNOSIS — Z6830 Body mass index (BMI) 30.0-30.9, adult: Secondary | ICD-10-CM | POA: Diagnosis not present

## 2019-06-12 DIAGNOSIS — E8881 Metabolic syndrome: Secondary | ICD-10-CM | POA: Diagnosis not present

## 2019-06-19 DIAGNOSIS — E039 Hypothyroidism, unspecified: Secondary | ICD-10-CM | POA: Diagnosis not present

## 2019-06-19 DIAGNOSIS — Z6829 Body mass index (BMI) 29.0-29.9, adult: Secondary | ICD-10-CM | POA: Diagnosis not present

## 2019-06-26 DIAGNOSIS — Z6829 Body mass index (BMI) 29.0-29.9, adult: Secondary | ICD-10-CM | POA: Diagnosis not present

## 2019-06-26 DIAGNOSIS — E782 Mixed hyperlipidemia: Secondary | ICD-10-CM | POA: Diagnosis not present

## 2019-07-03 DIAGNOSIS — Z6828 Body mass index (BMI) 28.0-28.9, adult: Secondary | ICD-10-CM | POA: Diagnosis not present

## 2019-07-03 DIAGNOSIS — E782 Mixed hyperlipidemia: Secondary | ICD-10-CM | POA: Diagnosis not present

## 2019-07-03 DIAGNOSIS — K219 Gastro-esophageal reflux disease without esophagitis: Secondary | ICD-10-CM | POA: Diagnosis not present

## 2019-07-03 DIAGNOSIS — I10 Essential (primary) hypertension: Secondary | ICD-10-CM | POA: Diagnosis not present

## 2019-07-10 DIAGNOSIS — I10 Essential (primary) hypertension: Secondary | ICD-10-CM | POA: Diagnosis not present

## 2019-07-10 DIAGNOSIS — J3089 Other allergic rhinitis: Secondary | ICD-10-CM | POA: Diagnosis not present

## 2019-07-10 DIAGNOSIS — Z6828 Body mass index (BMI) 28.0-28.9, adult: Secondary | ICD-10-CM | POA: Diagnosis not present

## 2019-07-10 DIAGNOSIS — J301 Allergic rhinitis due to pollen: Secondary | ICD-10-CM | POA: Diagnosis not present

## 2019-07-17 DIAGNOSIS — E8881 Metabolic syndrome: Secondary | ICD-10-CM | POA: Diagnosis not present

## 2019-07-17 DIAGNOSIS — Z6828 Body mass index (BMI) 28.0-28.9, adult: Secondary | ICD-10-CM | POA: Diagnosis not present

## 2019-07-24 DIAGNOSIS — E782 Mixed hyperlipidemia: Secondary | ICD-10-CM | POA: Diagnosis not present

## 2019-07-24 DIAGNOSIS — Z6828 Body mass index (BMI) 28.0-28.9, adult: Secondary | ICD-10-CM | POA: Diagnosis not present

## 2019-07-24 DIAGNOSIS — E291 Testicular hypofunction: Secondary | ICD-10-CM | POA: Diagnosis not present

## 2019-07-24 DIAGNOSIS — E8881 Metabolic syndrome: Secondary | ICD-10-CM | POA: Diagnosis not present

## 2019-07-24 DIAGNOSIS — K219 Gastro-esophageal reflux disease without esophagitis: Secondary | ICD-10-CM | POA: Diagnosis not present

## 2019-07-24 DIAGNOSIS — E039 Hypothyroidism, unspecified: Secondary | ICD-10-CM | POA: Diagnosis not present

## 2019-07-31 DIAGNOSIS — H1045 Other chronic allergic conjunctivitis: Secondary | ICD-10-CM | POA: Diagnosis not present

## 2019-07-31 DIAGNOSIS — J3089 Other allergic rhinitis: Secondary | ICD-10-CM | POA: Diagnosis not present

## 2019-07-31 DIAGNOSIS — J301 Allergic rhinitis due to pollen: Secondary | ICD-10-CM | POA: Diagnosis not present

## 2019-08-07 DIAGNOSIS — Z6827 Body mass index (BMI) 27.0-27.9, adult: Secondary | ICD-10-CM | POA: Diagnosis not present

## 2019-08-07 DIAGNOSIS — E782 Mixed hyperlipidemia: Secondary | ICD-10-CM | POA: Diagnosis not present

## 2019-08-07 DIAGNOSIS — E8881 Metabolic syndrome: Secondary | ICD-10-CM | POA: Diagnosis not present

## 2019-08-13 DIAGNOSIS — J301 Allergic rhinitis due to pollen: Secondary | ICD-10-CM | POA: Diagnosis not present

## 2019-08-13 DIAGNOSIS — J3089 Other allergic rhinitis: Secondary | ICD-10-CM | POA: Diagnosis not present

## 2019-08-21 DIAGNOSIS — Z6827 Body mass index (BMI) 27.0-27.9, adult: Secondary | ICD-10-CM | POA: Diagnosis not present

## 2019-08-21 DIAGNOSIS — I10 Essential (primary) hypertension: Secondary | ICD-10-CM | POA: Diagnosis not present

## 2019-08-21 DIAGNOSIS — E782 Mixed hyperlipidemia: Secondary | ICD-10-CM | POA: Diagnosis not present

## 2019-09-04 DIAGNOSIS — J301 Allergic rhinitis due to pollen: Secondary | ICD-10-CM | POA: Diagnosis not present

## 2019-09-04 DIAGNOSIS — J3089 Other allergic rhinitis: Secondary | ICD-10-CM | POA: Diagnosis not present

## 2019-09-11 DIAGNOSIS — E8881 Metabolic syndrome: Secondary | ICD-10-CM | POA: Diagnosis not present

## 2019-09-11 DIAGNOSIS — Z6827 Body mass index (BMI) 27.0-27.9, adult: Secondary | ICD-10-CM | POA: Diagnosis not present

## 2019-09-15 ENCOUNTER — Other Ambulatory Visit: Payer: Self-pay

## 2019-09-16 ENCOUNTER — Ambulatory Visit (INDEPENDENT_AMBULATORY_CARE_PROVIDER_SITE_OTHER): Payer: Medicare Other | Admitting: Family Medicine

## 2019-09-16 ENCOUNTER — Encounter: Payer: Self-pay | Admitting: Family Medicine

## 2019-09-16 VITALS — BP 132/76 | HR 71 | Temp 98.8°F | Ht 67.0 in | Wt 176.2 lb

## 2019-09-16 DIAGNOSIS — E039 Hypothyroidism, unspecified: Secondary | ICD-10-CM

## 2019-09-16 DIAGNOSIS — M1A09X0 Idiopathic chronic gout, multiple sites, without tophus (tophi): Secondary | ICD-10-CM

## 2019-09-16 DIAGNOSIS — K219 Gastro-esophageal reflux disease without esophagitis: Secondary | ICD-10-CM

## 2019-09-16 DIAGNOSIS — E7800 Pure hypercholesterolemia, unspecified: Secondary | ICD-10-CM

## 2019-09-16 DIAGNOSIS — I10 Essential (primary) hypertension: Secondary | ICD-10-CM

## 2019-09-16 DIAGNOSIS — M1990 Unspecified osteoarthritis, unspecified site: Secondary | ICD-10-CM

## 2019-09-16 NOTE — Assessment & Plan Note (Signed)
Continue Synthroid 88 mcg daily.  Check lipid panel next blood draw.

## 2019-09-16 NOTE — Assessment & Plan Note (Signed)
At goal.  Continue Norvasc 10 mg daily. 

## 2019-09-16 NOTE — Patient Instructions (Signed)
It was very nice to see you today!  STOP your gout medication.  No other changes today. Come back in the summer for your annual check up, or sooner if needed.   Take care, Dr Jerline Pain  Please try these tips to maintain a healthy lifestyle:   Eat at least 3 REAL meals and 1-2 snacks per day.  Aim for no more than 5 hours between eating.  If you eat breakfast, please do so within one hour of getting up.    Each meal should contain half fruits/vegetables, one quarter protein, and one quarter carbs (no bigger than a computer mouse)   Cut down on sweet beverages. This includes juice, soda, and sweet tea.     Drink at least 1 glass of water with each meal and aim for at least 8 glasses per day   Exercise at least 150 minutes every week.

## 2019-09-16 NOTE — Progress Notes (Signed)
   Chief Complaint:  Brendan Simpson is a 68 y.o. male who presents today with a chief complaint of hypertension.   Assessment/Plan:  Hyperlipidemia Continue Crestor 40 mg daily.  Check lipid panel next blood draw.  Hypothyroidism Continue Synthroid 88 mcg daily.  Check lipid panel next blood draw.  Benign essential hypertension At goal.  Continue Norvasc 10 mg daily.  Gout No recent flares.  Will stop allopurinol.  Check uric acid next blood draw.  Esophageal reflux Stable.  Continue nexium  Arthritis Stable.  Continue OTC meds as needed.    Subjective:  HPI:  His stable, chronic medical conditions are outlined below:  # Essential Hypertension - On norvasc 10mg  daily and tolerating well - ROS: No reported chest pain or shortness of breath  # Hypothyroidism - On synthroid 23mcg daily and tolerating well  # Gout - On allopurinol 100mg  daily and tolerating well - ROS: No recent flares  # GERD - On nexium 40mg  daily  # Dyslipidemia - On crestor 40mg  daily and tolerating well - ROS: No reported myalgias  # Arthritis - Uses OTC analgesics as needed  ROS: Per HPI  PMH: He reports that he has never smoked. He has never used smokeless tobacco. He reports current alcohol use. He reports that he does not use drugs.      Objective:  Physical Exam: BP 132/76   Pulse 71   Temp 98.8 F (37.1 C)   Ht 5\' 7"  (1.702 m)   Wt 176 lb 4 oz (79.9 kg)   SpO2 99%   BMI 27.60 kg/m   Gen: NAD, resting comfortably CV: Regular rate and rhythm with no murmurs appreciated Pulm: Normal work of breathing, clear to auscultation bilaterally with no crackles, wheezes, or rhonchi      Daryus Sowash M. Jerline Pain, MD 09/16/2019 3:37 PM

## 2019-09-16 NOTE — Assessment & Plan Note (Signed)
No recent flares.  Will stop allopurinol.  Check uric acid next blood draw.

## 2019-09-16 NOTE — Assessment & Plan Note (Signed)
Stable.  Continue OTC meds as needed. 

## 2019-09-16 NOTE — Assessment & Plan Note (Signed)
Continue Crestor 40 mg daily.  Check lipid panel next blood draw.

## 2019-09-16 NOTE — Assessment & Plan Note (Addendum)
Stable.  Continue nexium

## 2019-10-02 ENCOUNTER — Encounter: Payer: Self-pay | Admitting: Family Medicine

## 2019-10-02 ENCOUNTER — Other Ambulatory Visit: Payer: Self-pay

## 2019-10-02 ENCOUNTER — Ambulatory Visit (INDEPENDENT_AMBULATORY_CARE_PROVIDER_SITE_OTHER): Payer: Medicare Other | Admitting: Family Medicine

## 2019-10-02 VITALS — BP 136/78 | HR 72 | Temp 97.7°F | Ht 67.0 in | Wt 175.2 lb

## 2019-10-02 DIAGNOSIS — R200 Anesthesia of skin: Secondary | ICD-10-CM | POA: Diagnosis not present

## 2019-10-02 MED ORDER — PREDNISONE 50 MG PO TABS: ORAL_TABLET | ORAL | 0 refills | Status: DC

## 2019-10-02 NOTE — Patient Instructions (Signed)
It was very nice to see you today!  You have a pinched nerve. Please start the prednisone.  Let me know if not improving in a week or so.   Take care, Dr Jerline Pain  Please try these tips to maintain a healthy lifestyle:   Eat at least 3 REAL meals and 1-2 snacks per day.  Aim for no more than 5 hours between eating.  If you eat breakfast, please do so within one hour of getting up.    Each meal should contain half fruits/vegetables, one quarter protein, and one quarter carbs (no bigger than a computer mouse)   Cut down on sweet beverages. This includes juice, soda, and sweet tea.     Drink at least 1 glass of water with each meal and aim for at least 8 glasses per day   Exercise at least 150 minutes every week.

## 2019-10-02 NOTE — Progress Notes (Signed)
   Chief Complaint:  Brendan Simpson is a 68 y.o. male who presents today with a chief complaint of numbness.   Assessment/Plan:  Numbness No red flags.  Neurologic exam essentially normal with exception of decreased sensation along right posterior C3 and C4 area.  Likely due to prolonged positioning while undergoing dental procedure.  Discussed options including course of prednisone versus watchful waiting.  Patient elected to have a course of prednisone.  Discussed potential side effects.  Discussed reasons to return to care.  Follow up as needed.    Subjective:  HPI:  Numbness Started about a week ago.  Located on the right side of his neck.  Had a dental work done and had a tooth pulled.  Feels like the numbing medication never wore off.  Symptoms are stable at this time.  No treatments tried.  No pain.  No arm or leg numbness or weakness.  No headache or vision changes.  No other obvious aggravating or alleviating factors.    ROS: Per HPI  PMH: He reports that he has never smoked. He has never used smokeless tobacco. He reports current alcohol use. He reports that he does not use drugs.      Objective:  Physical Exam: There were no vitals taken for this visit.  Gen: NAD, resting comfortably Neuro: Cranial nerves III through XII intact.  Strength 5 out of 5 in upper and lower extremities.  Reflexes 2+ and symmetric in bilateral upper and lower extremities.  Decreased sensation to pinprick along right posterior neck.     Algis Greenhouse. Jerline Pain, MD 10/02/2019 8:21 AM

## 2019-10-08 DIAGNOSIS — M9903 Segmental and somatic dysfunction of lumbar region: Secondary | ICD-10-CM | POA: Diagnosis not present

## 2019-10-08 DIAGNOSIS — M5432 Sciatica, left side: Secondary | ICD-10-CM | POA: Diagnosis not present

## 2019-10-08 DIAGNOSIS — M545 Low back pain: Secondary | ICD-10-CM | POA: Diagnosis not present

## 2019-10-08 DIAGNOSIS — M9902 Segmental and somatic dysfunction of thoracic region: Secondary | ICD-10-CM | POA: Diagnosis not present

## 2019-10-08 DIAGNOSIS — M62830 Muscle spasm of back: Secondary | ICD-10-CM | POA: Diagnosis not present

## 2019-10-08 DIAGNOSIS — M9901 Segmental and somatic dysfunction of cervical region: Secondary | ICD-10-CM | POA: Diagnosis not present

## 2019-10-09 DIAGNOSIS — M9901 Segmental and somatic dysfunction of cervical region: Secondary | ICD-10-CM | POA: Diagnosis not present

## 2019-10-09 DIAGNOSIS — M25562 Pain in left knee: Secondary | ICD-10-CM | POA: Diagnosis not present

## 2019-10-09 DIAGNOSIS — M1712 Unilateral primary osteoarthritis, left knee: Secondary | ICD-10-CM | POA: Diagnosis not present

## 2019-10-09 DIAGNOSIS — M9902 Segmental and somatic dysfunction of thoracic region: Secondary | ICD-10-CM | POA: Diagnosis not present

## 2019-10-09 DIAGNOSIS — M25561 Pain in right knee: Secondary | ICD-10-CM | POA: Diagnosis not present

## 2019-10-09 DIAGNOSIS — M9903 Segmental and somatic dysfunction of lumbar region: Secondary | ICD-10-CM | POA: Diagnosis not present

## 2019-10-09 DIAGNOSIS — M1711 Unilateral primary osteoarthritis, right knee: Secondary | ICD-10-CM | POA: Diagnosis not present

## 2019-10-09 DIAGNOSIS — M545 Low back pain: Secondary | ICD-10-CM | POA: Diagnosis not present

## 2019-10-09 DIAGNOSIS — M62830 Muscle spasm of back: Secondary | ICD-10-CM | POA: Diagnosis not present

## 2019-10-09 DIAGNOSIS — M5432 Sciatica, left side: Secondary | ICD-10-CM | POA: Diagnosis not present

## 2019-10-10 DIAGNOSIS — M1712 Unilateral primary osteoarthritis, left knee: Secondary | ICD-10-CM | POA: Diagnosis not present

## 2019-10-10 DIAGNOSIS — M25562 Pain in left knee: Secondary | ICD-10-CM | POA: Diagnosis not present

## 2019-10-10 DIAGNOSIS — G8929 Other chronic pain: Secondary | ICD-10-CM | POA: Diagnosis not present

## 2019-10-10 DIAGNOSIS — M1711 Unilateral primary osteoarthritis, right knee: Secondary | ICD-10-CM | POA: Diagnosis not present

## 2019-10-10 DIAGNOSIS — M25561 Pain in right knee: Secondary | ICD-10-CM | POA: Diagnosis not present

## 2019-10-14 DIAGNOSIS — J3089 Other allergic rhinitis: Secondary | ICD-10-CM | POA: Diagnosis not present

## 2019-10-14 DIAGNOSIS — M5432 Sciatica, left side: Secondary | ICD-10-CM | POA: Diagnosis not present

## 2019-10-14 DIAGNOSIS — M9902 Segmental and somatic dysfunction of thoracic region: Secondary | ICD-10-CM | POA: Diagnosis not present

## 2019-10-14 DIAGNOSIS — M62830 Muscle spasm of back: Secondary | ICD-10-CM | POA: Diagnosis not present

## 2019-10-14 DIAGNOSIS — J301 Allergic rhinitis due to pollen: Secondary | ICD-10-CM | POA: Diagnosis not present

## 2019-10-14 DIAGNOSIS — M9901 Segmental and somatic dysfunction of cervical region: Secondary | ICD-10-CM | POA: Diagnosis not present

## 2019-10-14 DIAGNOSIS — M545 Low back pain: Secondary | ICD-10-CM | POA: Diagnosis not present

## 2019-10-14 DIAGNOSIS — M9903 Segmental and somatic dysfunction of lumbar region: Secondary | ICD-10-CM | POA: Diagnosis not present

## 2019-10-20 DIAGNOSIS — M5432 Sciatica, left side: Secondary | ICD-10-CM | POA: Diagnosis not present

## 2019-10-20 DIAGNOSIS — M545 Low back pain: Secondary | ICD-10-CM | POA: Diagnosis not present

## 2019-10-20 DIAGNOSIS — M62830 Muscle spasm of back: Secondary | ICD-10-CM | POA: Diagnosis not present

## 2019-10-20 DIAGNOSIS — M9902 Segmental and somatic dysfunction of thoracic region: Secondary | ICD-10-CM | POA: Diagnosis not present

## 2019-10-20 DIAGNOSIS — M9903 Segmental and somatic dysfunction of lumbar region: Secondary | ICD-10-CM | POA: Diagnosis not present

## 2019-10-20 DIAGNOSIS — M9901 Segmental and somatic dysfunction of cervical region: Secondary | ICD-10-CM | POA: Diagnosis not present

## 2019-10-21 DIAGNOSIS — J3089 Other allergic rhinitis: Secondary | ICD-10-CM | POA: Diagnosis not present

## 2019-10-21 DIAGNOSIS — J301 Allergic rhinitis due to pollen: Secondary | ICD-10-CM | POA: Diagnosis not present

## 2019-10-30 DIAGNOSIS — J301 Allergic rhinitis due to pollen: Secondary | ICD-10-CM | POA: Diagnosis not present

## 2019-10-30 DIAGNOSIS — J3089 Other allergic rhinitis: Secondary | ICD-10-CM | POA: Diagnosis not present

## 2019-11-04 DIAGNOSIS — J301 Allergic rhinitis due to pollen: Secondary | ICD-10-CM | POA: Diagnosis not present

## 2019-11-04 DIAGNOSIS — J3089 Other allergic rhinitis: Secondary | ICD-10-CM | POA: Diagnosis not present

## 2019-11-17 DIAGNOSIS — J301 Allergic rhinitis due to pollen: Secondary | ICD-10-CM | POA: Diagnosis not present

## 2019-11-17 DIAGNOSIS — J3089 Other allergic rhinitis: Secondary | ICD-10-CM | POA: Diagnosis not present

## 2019-11-19 ENCOUNTER — Other Ambulatory Visit: Payer: Self-pay | Admitting: Family Medicine

## 2019-11-19 DIAGNOSIS — Z6826 Body mass index (BMI) 26.0-26.9, adult: Secondary | ICD-10-CM | POA: Diagnosis not present

## 2019-11-19 DIAGNOSIS — E8881 Metabolic syndrome: Secondary | ICD-10-CM | POA: Diagnosis not present

## 2019-11-19 NOTE — Telephone Encounter (Signed)
Please review

## 2019-11-27 DIAGNOSIS — J3089 Other allergic rhinitis: Secondary | ICD-10-CM | POA: Diagnosis not present

## 2019-11-27 DIAGNOSIS — J301 Allergic rhinitis due to pollen: Secondary | ICD-10-CM | POA: Diagnosis not present

## 2019-12-08 DIAGNOSIS — J301 Allergic rhinitis due to pollen: Secondary | ICD-10-CM | POA: Diagnosis not present

## 2019-12-08 DIAGNOSIS — J3089 Other allergic rhinitis: Secondary | ICD-10-CM | POA: Diagnosis not present

## 2020-01-01 ENCOUNTER — Other Ambulatory Visit: Payer: Self-pay | Admitting: Family Medicine

## 2020-01-01 DIAGNOSIS — Z6825 Body mass index (BMI) 25.0-25.9, adult: Secondary | ICD-10-CM | POA: Diagnosis not present

## 2020-01-01 DIAGNOSIS — I10 Essential (primary) hypertension: Secondary | ICD-10-CM

## 2020-01-01 DIAGNOSIS — E039 Hypothyroidism, unspecified: Secondary | ICD-10-CM | POA: Diagnosis not present

## 2020-01-06 ENCOUNTER — Other Ambulatory Visit: Payer: Self-pay | Admitting: *Deleted

## 2020-01-06 DIAGNOSIS — J3089 Other allergic rhinitis: Secondary | ICD-10-CM | POA: Diagnosis not present

## 2020-01-06 DIAGNOSIS — J301 Allergic rhinitis due to pollen: Secondary | ICD-10-CM | POA: Diagnosis not present

## 2020-01-06 DIAGNOSIS — I10 Essential (primary) hypertension: Secondary | ICD-10-CM

## 2020-01-06 MED ORDER — AMLODIPINE BESYLATE 10 MG PO TABS: 10.0000 mg | ORAL_TABLET | Freq: Every day | ORAL | 2 refills | Status: DC

## 2020-01-08 DIAGNOSIS — J3089 Other allergic rhinitis: Secondary | ICD-10-CM | POA: Diagnosis not present

## 2020-01-08 DIAGNOSIS — J301 Allergic rhinitis due to pollen: Secondary | ICD-10-CM | POA: Diagnosis not present

## 2020-01-09 DIAGNOSIS — M170 Bilateral primary osteoarthritis of knee: Secondary | ICD-10-CM | POA: Diagnosis not present

## 2020-01-13 ENCOUNTER — Other Ambulatory Visit: Payer: Self-pay | Admitting: Family Medicine

## 2020-01-13 DIAGNOSIS — K219 Gastro-esophageal reflux disease without esophagitis: Secondary | ICD-10-CM

## 2020-01-13 DIAGNOSIS — E039 Hypothyroidism, unspecified: Secondary | ICD-10-CM

## 2020-01-13 NOTE — Telephone Encounter (Signed)
Please review

## 2020-02-06 DIAGNOSIS — J301 Allergic rhinitis due to pollen: Secondary | ICD-10-CM | POA: Diagnosis not present

## 2020-02-06 DIAGNOSIS — J3089 Other allergic rhinitis: Secondary | ICD-10-CM | POA: Diagnosis not present

## 2020-02-29 ENCOUNTER — Other Ambulatory Visit: Payer: Self-pay | Admitting: Family Medicine

## 2020-02-29 DIAGNOSIS — E7800 Pure hypercholesterolemia, unspecified: Secondary | ICD-10-CM

## 2020-03-02 ENCOUNTER — Ambulatory Visit (INDEPENDENT_AMBULATORY_CARE_PROVIDER_SITE_OTHER): Payer: Medicare Other | Admitting: Physician Assistant

## 2020-03-02 ENCOUNTER — Other Ambulatory Visit: Payer: Self-pay

## 2020-03-02 ENCOUNTER — Encounter: Payer: Self-pay | Admitting: Physician Assistant

## 2020-03-02 VITALS — BP 120/78 | HR 71 | Temp 97.5°F | Ht 67.0 in | Wt 166.0 lb

## 2020-03-02 DIAGNOSIS — M25551 Pain in right hip: Secondary | ICD-10-CM | POA: Diagnosis not present

## 2020-03-02 DIAGNOSIS — R350 Frequency of micturition: Secondary | ICD-10-CM

## 2020-03-02 DIAGNOSIS — R351 Nocturia: Secondary | ICD-10-CM

## 2020-03-02 LAB — BASIC METABOLIC PANEL
BUN: 13 mg/dL (ref 6–23)
CO2: 30 mEq/L (ref 19–32)
Calcium: 9.8 mg/dL (ref 8.4–10.5)
Chloride: 102 mEq/L (ref 96–112)
Creatinine, Ser: 0.92 mg/dL (ref 0.40–1.50)
GFR: 99.28 mL/min (ref >60.00)
Glucose, Bld: 96 mg/dL (ref 70–99)
Potassium: 4 mEq/L (ref 3.5–5.1)
Sodium: 141 mEq/L (ref 135–145)

## 2020-03-02 LAB — POCT URINALYSIS DIPSTICK
Bilirubin, UA: NEGATIVE
Blood, UA: NEGATIVE
Glucose, UA: NEGATIVE
Ketones, UA: NEGATIVE
Leukocytes, UA: NEGATIVE
Nitrite, UA: NEGATIVE
Protein, UA: NEGATIVE
Spec Grav, UA: 1.015 (ref 1.010–1.025)
Urobilinogen, UA: 0.2 E.U./dL
pH, UA: 6 (ref 5.0–8.0)

## 2020-03-02 LAB — PSA: PSA: 0.9 ng/mL (ref 0.10–4.00)

## 2020-03-02 MED ORDER — TAMSULOSIN HCL 0.4 MG PO CAPS
0.4000 mg | ORAL_CAPSULE | Freq: Every day | ORAL | 1 refills | Status: DC
Start: 2020-03-02 — End: 2020-03-24

## 2020-03-02 MED ORDER — DICLOFENAC SODIUM 75 MG PO TBEC
75.0000 mg | DELAYED_RELEASE_TABLET | Freq: Two times a day (BID) | ORAL | 0 refills | Status: DC
Start: 2020-03-02 — End: 2020-03-08

## 2020-03-02 NOTE — Progress Notes (Signed)
Brendan Simpson is a 69 y.o. male here for a new problem.  I acted as a Neurosurgeon for [employer], PA-C Corky Mull, LPN   History of Present Illness:   Chief Complaint  Patient presents with  . Urinary Frequency  . Nocturia  . Hip Pain    HPI   Urinary frequency Pt c/o frequency with urination, nocturia x 3 months and feels pelvic pressure that is relieved after urination. He reports lower back pain, which he has a history of, and states that it feels like his baseline. Denies hematuria, changes in stools, family hx prostate CA.  Lab Results  Component Value Date   PSA 1.51 03/20/2018   PSA 1.62 03/19/2017   Has had about 10 lb weight loss since December -- but he states that he is dieting and this is intentional weight loss.  Hip pain Pt c/o right hip pain since Wednesday. Walks on average 5 miles a day. Pt states hurts when he walks and lifts his leg. Took Aleve no relief. Has not tried ice or heat. Has hx of bursitis of hip in the past about 30 years ago -- feels similar. Denies R leg numbness/tingling.   Wt Readings from Last 4 Encounters:  03/02/20 166 lb (75.3 kg)  10/02/19 175 lb 4 oz (79.5 kg)  09/16/19 176 lb 4 oz (79.9 kg)  03/24/19 203 lb 8 oz (92.3 kg)     Past Medical History:  Diagnosis Date  . Arthritis   . Cataract   . GERD (gastroesophageal reflux disease)   . Gout   . Hyperlipidemia   . Hypertension   . Hypothyroidism   . Seasonal allergies   . Uses hearing aid      Social History   Tobacco Use  . Smoking status: Never Smoker  . Smokeless tobacco: Never Used  Substance Use Topics  . Alcohol use: Yes    Alcohol/week: 0.0 standard drinks    Comment: occasioinal  . Drug use: No    Past Surgical History:  Procedure Laterality Date  . CARPAL TUNNEL RELEASE Bilateral   . CATARACT EXTRACTION, BILATERAL Bilateral   . KNEE ARTHROSCOPY Left     Family History  Problem Relation Age of Onset  . Breast cancer Mother   . Heart disease  Father   . Diabetes Sister   . Diabetes Brother   . Colon cancer Neg Hx   . Esophageal cancer Neg Hx   . Rectal cancer Neg Hx   . Stomach cancer Neg Hx     Allergies  Allergen Reactions  . Atorvastatin Other (See Comments)    Statin-myopathy    Current Medications:   Current Outpatient Medications:  .  allopurinol (ZYLOPRIM) 100 MG tablet, TAKE 1 TABLET BY MOUTH EVERY DAY, Disp: 90 tablet, Rfl: 1 .  amLODipine (NORVASC) 10 MG tablet, Take 1 tablet (10 mg total) by mouth daily., Disp: 90 tablet, Rfl: 2 .  aspirin EC 81 MG tablet, Take 81 mg by mouth daily., Disp: , Rfl:  .  azelaic acid (AZELEX) 20 % cream, Apply topically as needed. After skin is thoroughly washed and patted dry, gently but thoroughly massage a thin film of azelaic acid cream into the affected area twice daily, in the morning and evening., Disp: 50 g, Rfl: 2 .  esomeprazole (NEXIUM) 40 MG capsule, TAKE 1 CAPSULE BY MOUTH EVERY DAY, Disp: 90 capsule, Rfl: 1 .  levothyroxine (SYNTHROID) 88 MCG tablet, TAKE 1 TABLET (88 MCG TOTAL) BY MOUTH  DAILY BEFORE BREAKFAST., Disp: 90 tablet, Rfl: 1 .  Multiple Vitamins-Minerals (MENS 50+ MULTI VITAMIN/MIN PO), Take 1 tablet by mouth daily., Disp: , Rfl:  .  Oxymetazoline HCl (NASAL SPRAY 12 HOUR NA), Place 1 spray into both nostrils as needed. , Disp: , Rfl:  .  Probiotic Product (PROBIOTIC DAILY PO), Take 1 tablet by mouth daily., Disp: , Rfl:  .  diclofenac (VOLTAREN) 75 MG EC tablet, Take 1 tablet (75 mg total) by mouth 2 (two) times daily., Disp: 30 tablet, Rfl: 0 .  EPINEPHrine 0.3 mg/0.3 mL IJ SOAJ injection, See admin instructions., Disp: , Rfl:  .  rosuvastatin (CRESTOR) 40 MG tablet, Take 1 tablet (40 mg total) by mouth daily., Disp: 90 tablet, Rfl: 3 .  tamsulosin (FLOMAX) 0.4 MG CAPS capsule, Take 1 capsule (0.4 mg total) by mouth daily., Disp: 30 capsule, Rfl: 1   Review of Systems:   ROS  Negative unless otherwise specified per HPI.  Vitals:   Vitals:    03/02/20 1022  BP: 120/78  Pulse: 71  Temp: (!) 97.5 F (36.4 C)  TempSrc: Temporal  SpO2: 98%  Weight: 166 lb (75.3 kg)  Height: 5\' 7"  (1.702 m)     Body mass index is 26 kg/m.  Physical Exam:   Physical Exam Vitals and nursing note reviewed.  Constitutional:      General: He is not in acute distress.    Appearance: He is well-developed. He is not ill-appearing or toxic-appearing.  Cardiovascular:     Rate and Rhythm: Normal rate and regular rhythm.     Pulses: Normal pulses.     Heart sounds: Normal heart sounds, S1 normal and S2 normal.     Comments: No LE edema Pulmonary:     Effort: Pulmonary effort is normal.     Breath sounds: Normal breath sounds.  Musculoskeletal:     Comments: Pain to R hip with extension of R leg and internal rotation of leg  Skin:    General: Skin is warm and dry.  Neurological:     Mental Status: He is alert.     GCS: GCS eye subscore is 4. GCS verbal subscore is 5. GCS motor subscore is 6.  Psychiatric:        Speech: Speech normal.        Behavior: Behavior normal. Behavior is cooperative.     Results for orders placed or performed in visit on 03/02/20  POCT urinalysis dipstick  Result Value Ref Range   Color, UA light yellow    Clarity, UA clear    Glucose, UA Negative Negative   Bilirubin, UA Negative    Ketones, UA Negative    Spec Grav, UA 1.015 1.010 - 1.025   Blood, UA Negative    pH, UA 6.0 5.0 - 8.0   Protein, UA Negative Negative   Urobilinogen, UA 0.2 0.2 or 1.0 E.U./dL   Nitrite, UA Negative    Leukocytes, UA Negative Negative   Appearance     Odor      Assessment and Plan:   Brendan Simpson was seen today for urinary frequency, nocturia and hip pain.  Diagnoses and all orders for this visit:  Urinary frequency; Nocturia Possible BPH, however will check PSA and refer to urology for further formal evaluation. Did prescribe flomax to see if this helps his symptoms.  -     POCT urinalysis dipstick -     Urine  Culture -     Basic metabolic panel -  PSA -     Ambulatory referral to Urology  Right hip pain Suspect bursitis. Will trial oral diclofenac and rest. Stretching recommendations provided. If no improvement of symptoms, will refer to sports medicine.  Other orders -     diclofenac (VOLTAREN) 75 MG EC tablet; Take 1 tablet (75 mg total) by mouth 2 (two) times daily. -     tamsulosin (FLOMAX) 0.4 MG CAPS capsule; Take 1 capsule (0.4 mg total) by mouth daily.  . Reviewed expectations re: course of current medical issues. . Discussed self-management of symptoms. . Outlined signs and symptoms indicating need for more acute intervention. . Patient verbalized understanding and all questions were answered. . See orders for this visit as documented in the electronic medical record. . Patient received an After-Visit Summary.  CMA or LPN served as scribe during this visit. History, Physical, and Plan performed by medical provider. The above documentation has been reviewed and is accurate and complete.  Inda Coke, PA-C

## 2020-03-02 NOTE — Telephone Encounter (Signed)
Please review

## 2020-03-02 NOTE — Patient Instructions (Signed)
It was great to see you!  For your hip: Continue to avoid excessive walking. Trial the oral diclofenac medication that has been sent in -- you do not need to take additional ibuprofen while on this medication. If no improvement, or worsening of symptoms, let us know.  For your bladder: Start daily flomax to see if this helps. I will be in touch with your lab results. The urology office will contact you for an appointment.  Take care,  Jarold Motto PA-C

## 2020-03-03 LAB — URINE CULTURE
MICRO NUMBER:: 10538703
Result:: NO GROWTH
SPECIMEN QUALITY:: ADEQUATE

## 2020-03-04 DIAGNOSIS — E8881 Metabolic syndrome: Secondary | ICD-10-CM | POA: Diagnosis not present

## 2020-03-04 DIAGNOSIS — Z6825 Body mass index (BMI) 25.0-25.9, adult: Secondary | ICD-10-CM | POA: Diagnosis not present

## 2020-03-06 ENCOUNTER — Other Ambulatory Visit: Payer: Self-pay | Admitting: Physician Assistant

## 2020-03-08 DIAGNOSIS — J3089 Other allergic rhinitis: Secondary | ICD-10-CM | POA: Diagnosis not present

## 2020-03-08 DIAGNOSIS — J301 Allergic rhinitis due to pollen: Secondary | ICD-10-CM | POA: Diagnosis not present

## 2020-03-24 ENCOUNTER — Telehealth: Payer: Self-pay | Admitting: Family Medicine

## 2020-03-24 ENCOUNTER — Encounter: Payer: Medicare Other | Admitting: Family Medicine

## 2020-03-24 ENCOUNTER — Encounter: Payer: Self-pay | Admitting: Family Medicine

## 2020-03-24 ENCOUNTER — Ambulatory Visit (INDEPENDENT_AMBULATORY_CARE_PROVIDER_SITE_OTHER): Payer: Medicare Other | Admitting: Family Medicine

## 2020-03-24 ENCOUNTER — Other Ambulatory Visit: Payer: Self-pay

## 2020-03-24 VITALS — BP 122/80 | HR 75 | Temp 98.2°F | Ht 67.0 in | Wt 168.2 lb

## 2020-03-24 DIAGNOSIS — M1A09X0 Idiopathic chronic gout, multiple sites, without tophus (tophi): Secondary | ICD-10-CM | POA: Diagnosis not present

## 2020-03-24 DIAGNOSIS — N401 Enlarged prostate with lower urinary tract symptoms: Secondary | ICD-10-CM

## 2020-03-24 DIAGNOSIS — E7800 Pure hypercholesterolemia, unspecified: Secondary | ICD-10-CM

## 2020-03-24 DIAGNOSIS — N400 Enlarged prostate without lower urinary tract symptoms: Secondary | ICD-10-CM | POA: Insufficient documentation

## 2020-03-24 DIAGNOSIS — I10 Essential (primary) hypertension: Secondary | ICD-10-CM

## 2020-03-24 DIAGNOSIS — L739 Follicular disorder, unspecified: Secondary | ICD-10-CM

## 2020-03-24 DIAGNOSIS — M1990 Unspecified osteoarthritis, unspecified site: Secondary | ICD-10-CM

## 2020-03-24 DIAGNOSIS — R7303 Prediabetes: Secondary | ICD-10-CM

## 2020-03-24 DIAGNOSIS — E039 Hypothyroidism, unspecified: Secondary | ICD-10-CM | POA: Diagnosis not present

## 2020-03-24 DIAGNOSIS — K219 Gastro-esophageal reflux disease without esophagitis: Secondary | ICD-10-CM

## 2020-03-24 DIAGNOSIS — R351 Nocturia: Secondary | ICD-10-CM

## 2020-03-24 LAB — COMPREHENSIVE METABOLIC PANEL
ALT: 41 U/L (ref 0–53)
AST: 27 U/L (ref 0–37)
Albumin: 4.8 g/dL (ref 3.5–5.2)
Alkaline Phosphatase: 45 U/L (ref 39–117)
BUN: 13 mg/dL (ref 6–23)
CO2: 29 mEq/L (ref 19–32)
Calcium: 9.7 mg/dL (ref 8.4–10.5)
Chloride: 103 mEq/L (ref 96–112)
Creatinine, Ser: 0.98 mg/dL (ref 0.40–1.50)
GFR: 92.29 mL/min (ref >60.00)
Glucose, Bld: 105 mg/dL — ABNORMAL HIGH (ref 70–99)
Potassium: 4.3 mEq/L (ref 3.5–5.1)
Sodium: 139 mEq/L (ref 135–145)
Total Bilirubin: 0.6 mg/dL (ref 0.2–1.2)
Total Protein: 7.3 g/dL (ref 6.0–8.3)

## 2020-03-24 LAB — LIPID PANEL
Cholesterol: 146 mg/dL (ref 0–200)
HDL: 56.2 mg/dL (ref >39.00)
LDL Cholesterol: 77 mg/dL (ref 0–99)
NonHDL: 89.93
Total CHOL/HDL Ratio: 3
Triglycerides: 64 mg/dL (ref 0.0–149.0)
VLDL: 12.8 mg/dL (ref 0.0–40.0)

## 2020-03-24 LAB — CBC
HCT: 44.2 % (ref 39.0–52.0)
Hemoglobin: 15 g/dL (ref 13.0–17.0)
MCHC: 34 g/dL (ref 30.0–36.0)
MCV: 90.9 fl (ref 78.0–100.0)
Platelets: 189 10*3/uL (ref 150.0–400.0)
RBC: 4.86 Mil/uL (ref 4.22–5.81)
RDW: 12.9 % (ref 11.5–15.5)
WBC: 9.6 10*3/uL (ref 4.0–10.5)

## 2020-03-24 LAB — HEMOGLOBIN A1C: Hgb A1c MFr Bld: 6 % (ref 4.6–6.5)

## 2020-03-24 LAB — URIC ACID: Uric Acid, Serum: 5.5 mg/dL (ref 4.0–7.8)

## 2020-03-24 LAB — TSH: TSH: 3.42 u[IU]/mL (ref 0.35–4.50)

## 2020-03-24 MED ORDER — TAMSULOSIN HCL 0.4 MG PO CAPS: 0.4000 mg | ORAL_CAPSULE | Freq: Every day | ORAL | 1 refills | Status: DC

## 2020-03-24 MED ORDER — AZELAIC ACID 20 % EX CREA: TOPICAL_CREAM | CUTANEOUS | 2 refills | Status: DC | PRN

## 2020-03-24 NOTE — Telephone Encounter (Signed)
COVID-19 Vaccination Record Card 1st Dose January 20,2021    2nd Dose February 10,2021 Pfizer [facility] Department.

## 2020-03-24 NOTE — Assessment & Plan Note (Signed)
Continue over-the-counter analgesics as needed.  Also has prescription of Voltaren to use as needed.

## 2020-03-24 NOTE — Telephone Encounter (Signed)
Records updated.

## 2020-03-24 NOTE — Assessment & Plan Note (Signed)
Continue Synthroid 88 mcg daily.  Check TSH.

## 2020-03-24 NOTE — Assessment & Plan Note (Signed)
Doing fine off allopurinol.  Check uric acid level today.

## 2020-03-24 NOTE — Patient Instructions (Signed)
It was very nice to see you today!  We will check blood work today.  Please let me know if your pain returns.  Please give the urologist a call [phone]  Come back in 1 year for your next checkup, sooner if needed.  Take care, Dr [name]  Please try these tips to maintain a healthy lifestyle:   Eat at least 3 REAL meals and 1-2 snacks per day.  Aim for no more than 5 hours between eating.  If you eat breakfast, please do so within one hour of getting up.    Each meal should contain half fruits/vegetables, one quarter protein, and one quarter carbs (no bigger than a computer mouse)   Cut down on sweet beverages. This includes juice, soda, and sweet tea.     Drink at least 1 glass of water with each meal and aim for at least 8 glasses per day   Exercise at least 150 minutes every week.

## 2020-03-24 NOTE — Assessment & Plan Note (Signed)
Continue Crestor 40 mg daily.  Check CBC, C met, TSH, lipid panel.

## 2020-03-24 NOTE — Assessment & Plan Note (Signed)
Check A1c. 

## 2020-03-24 NOTE — Assessment & Plan Note (Signed)
Stable. Continue Nexium 40mg daily

## 2020-03-24 NOTE — Assessment & Plan Note (Signed)
At goal  Continue amlodipine 10mg daily

## 2020-03-24 NOTE — Assessment & Plan Note (Signed)
Stable.  Continue Flomax.  Has been referred to urology.  Gave contact information.

## 2020-03-24 NOTE — Progress Notes (Signed)
   Brendan Simpson is a 69 y.o. male who presents today for an office visit.  Assessment/Plan:  Chronic Problems Addressed Today: Folliculitis Stable.  Azelaic acid cream refilled today.  BPH (benign prostatic hyperplasia) Stable.  Continue Flomax.  Has been referred to urology.  Gave contact information.  Prediabetes Check A1c.  Hyperlipidemia Continue Crestor 40 mg daily.  Check CBC, C met, TSH, lipid panel.  Hypothyroidism Continue Synthroid 88 mcg daily.  Check TSH.  Benign essential hypertension At goal.  Continue amlodipine 10 mg daily.  Gout Doing fine off allopurinol.  Check uric acid level today.  Esophageal reflux Stable.  Continue Nexium 40 mg daily.  Arthritis Continue over-the-counter analgesics as needed.  Also has prescription of Voltaren to use as needed.     Subjective:  HPI:  Patient seen here few weeks ago for hip pain and nocturia.  Ended up seeing sports medicine.  Received injection for bursitis which helped.  He is also started on Flomax which seems to help with urinary issues as well.  His stable, chronic medical conditions are outlined below:  # Essential Hypertension - On norvasc 43m daily and tolerating well - ROS: No reported chest pain or shortness of breath  # Hypothyroidism - On synthroid 84m daily and tolerating well  # Gout - Not on any medications - ROS: No recent flares  # GERD - On nexium 4033maily  # Dyslipidemia - On crestor 88m88mily and tolerating well - ROS: No reported myalgias  # Arthritis - Uses OTC analgesics as needed        Objective:  Physical Exam: BP 122/80   Pulse 75   Temp 98.2 F (36.8 C)   Ht _0  (1.702 m)   Wt 168 lb 3.2 oz (76.3 kg)   SpO2 98%   BMI 26.34 kg/m   Wt Readings from Last 3 Encounters:  03/24/20 168 lb 3.2 oz (76.3 kg)  03/02/20 166 lb (75.3 kg)  10/02/19 175 lb 4 oz (79.5 kg)  Gen: No acute distress, resting comfortably CV: Regular rate and rhythm with no  murmurs appreciated Pulm: Normal work of breathing, clear to auscultation bilaterally with no crackles, wheezes, or rhonchi Neuro: Grossly normal, moves all extremities Psych: Normal affect and thought content      Sahas Sluka M. ParkJerline Pain 03/24/2020 10:29 AM

## 2020-03-24 NOTE — Assessment & Plan Note (Signed)
Stable.  Azelaic acid cream refilled today. 

## 2020-03-25 NOTE — Progress Notes (Signed)
Please inform patient of the following:  Blood work is all stable. Do not need to make any changes to his treatment plan at this time. Would like for him to keep up the good work with diet and exercise and we can recheck in a year.  Brendan Simpson. [name], MD 03/25/2020 3:31 PM

## 2020-04-07 ENCOUNTER — Telehealth: Payer: Self-pay | Admitting: Family Medicine

## 2020-04-07 NOTE — Telephone Encounter (Signed)
Left message for patient to call back and schedule Medicare Annual Wellness Visit (AWV) either virtually/audio only OR in office. Whatever the patients preference is.  Last AWV 03/24/19; please schedule at anytime with [facility]Nurse Health Advisor at [facility].  This should be a 45 minute visit.

## 2020-04-12 ENCOUNTER — Other Ambulatory Visit: Payer: Self-pay

## 2020-04-12 ENCOUNTER — Ambulatory Visit (INDEPENDENT_AMBULATORY_CARE_PROVIDER_SITE_OTHER): Payer: Medicare Other

## 2020-04-12 VITALS — BP 138/72 | HR 78 | Resp 20 | Ht 67.0 in | Wt 169.6 lb

## 2020-04-12 DIAGNOSIS — Z0000 Encounter for general adult medical examination without abnormal findings: Secondary | ICD-10-CM | POA: Diagnosis not present

## 2020-04-12 NOTE — Progress Notes (Signed)
Subjective:   Brendan Simpson is a 69 y.o. male who presents for Medicare Annual/Subsequent preventive examination.  Review of Systems     Cardiac Risk Factors include: hypertension;dyslipidemia;male gender     Objective:    Today's Vitals   04/12/20 0908  BP: 138/72  Pulse: 78  Resp: 20  Weight: 169 lb 9.6 oz (76.9 kg)  Height: 5\' 7"  (1.702 m)   Body mass index is 26.56 kg/m.  Advanced Directives 04/12/2020 03/26/2019  Does Patient Have a Medical Advance Directive? Yes No  Type of Estate agentAdvance Directive Healthcare Power of BurtonAttorney;Living will -  Does patient want to make changes to medical advance directive? Yes (MAU/Ambulatory/Procedural Areas - Information given) -  Copy of Healthcare Power of Attorney in Chart? No - copy requested -    Current Medications (verified) Outpatient Encounter Medications as of 04/12/2020  Medication Sig  . amLODipine (NORVASC) 10 MG tablet Take 1 tablet (10 mg total) by mouth daily.  Marland Kitchen. aspirin EC 81 MG tablet Take 81 mg by mouth daily.  Marland Kitchen. azelaic acid (AZELEX) 20 % cream Apply topically as needed. After skin is thoroughly washed and patted dry, gently but thoroughly massage a thin film of azelaic acid cream into the affected area twice daily, in the morning and evening.  Marland Kitchen. EPINEPHrine 0.3 mg/0.3 mL IJ SOAJ injection See admin instructions.  Marland Kitchen. esomeprazole (NEXIUM) 40 MG capsule TAKE 1 CAPSULE BY MOUTH EVERY DAY  . levothyroxine (SYNTHROID) 88 MCG tablet TAKE 1 TABLET (88 MCG TOTAL) BY MOUTH DAILY BEFORE BREAKFAST.  . Multiple Vitamins-Minerals (MENS 50+ MULTI VITAMIN/MIN PO) Take 1 tablet by mouth daily.  . Oxymetazoline HCl (NASAL SPRAY 12 HOUR NA) Place 1 spray into both nostrils as needed.   . Probiotic Product (PROBIOTIC DAILY PO) Take 1 tablet by mouth daily.  . rosuvastatin (CRESTOR) 40 MG tablet TAKE 1 TABLET BY MOUTH EVERY DAY  . tamsulosin (FLOMAX) 0.4 MG CAPS capsule Take 1 capsule (0.4 mg total) by mouth daily.  . diclofenac (VOLTAREN)  75 MG EC tablet TAKE 1 TABLET BY MOUTH TWICE A DAY (Patient not taking: Reported on 04/12/2020)   No facility-administered encounter medications on file as of 04/12/2020.    Allergies (verified) Atorvastatin   History: Past Medical History:  Diagnosis Date  . Arthritis   . Cataract   . GERD (gastroesophageal reflux disease)   . Gout   . Hyperlipidemia   . Hypertension   . Hypothyroidism   . Seasonal allergies   . Uses hearing aid    Past Surgical History:  Procedure Laterality Date  . CARPAL TUNNEL RELEASE Bilateral   . CATARACT EXTRACTION, BILATERAL Bilateral   . KNEE ARTHROSCOPY Left    Family History  Problem Relation Age of Onset  . Breast cancer Mother   . Heart disease Father   . Diabetes Sister   . Diabetes Brother   . Colon cancer Neg Hx   . Esophageal cancer Neg Hx   . Rectal cancer Neg Hx   . Stomach cancer Neg Hx    Social History   Socioeconomic History  . Marital status: Married    Spouse name: Brendan Simpson  . Number of children: 2  . Years of education: Not on file  . Highest education level: Not on file  Occupational History  . Occupation: Facilities managerBus Driver    Employer: [employer]  . Occupation: Retired  Tobacco Use  . Smoking status: Never Smoker  . Smokeless tobacco: Never Used  Substance and Sexual Activity  . Alcohol use: Yes    Alcohol/week: 0.0 standard drinks    Comment: occasioinal  . Drug use: No  . Sexual activity: Yes    Partners: Female    Birth control/protection: None  Other Topics Concern  . Not on file  Social History Narrative   [employer] 4 days per week. Aerobic and strength training. Christian. Wife is Brendan Simpson, together nearly 50 years. Adult children are supportive. Will and POA in place. 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    Social Determinants of Health   Financial Resource Strain: Low Risk   . Difficulty of Paying Living Expenses: Not hard at all  Food Insecurity: No Food Insecurity  . Worried About  Programme researcher, broadcasting/film/video in the Last Year: Never true  . Ran Out of Food in the Last Year: Never true  Transportation Needs: No Transportation Needs  . Lack of Transportation (Medical): No  . Lack of Transportation (Non-Medical): No  Physical Activity: Insufficiently Active  . Days of Exercise per Week: 3 days  . Minutes of Exercise per Session: 30 min  Stress: No Stress Concern Present  . Feeling of Stress : Not at all  Social Connections: Moderately Integrated  . Frequency of Communication with Friends and Family: More than three times a week  . Frequency of Social Gatherings with Friends and Family: More than three times a week  . Attends Religious Services: More than 4 times per year  . Active Member of Clubs or Organizations: No  . Attends Banker Meetings: Never  . Marital Status: Married    Tobacco Counseling Counseling given: Not Answered   Clinical Intake:  Pre-visit preparation completed: Yes  Pain : No/denies pain     BMI - recorded: 26.56 Nutritional Status: BMI 25 -29 Overweight Nutritional Risks: None Diabetes: No  How often do you need to have someone help you when you read instructions, pamphlets, or other written materials from your doctor or pharmacy?: 1 - Never  Diabetic?No  Interpreter Needed?: No  Information entered by :: Lanier Ensign, LPN   Activities of Daily Living In your present state of health, do you have any difficulty performing the following activities: 04/12/2020  Hearing? N  Vision? N  Difficulty concentrating or making decisions? N  Walking or climbing stairs? Y  Comment gets difficult , trys to avoid stairs  Dressing or bathing? N  Doing errands, shopping? N  Some recent data might be hidden    Patient Care Team: Ardith Dark, MD as PCP - General (Family Medicine) Irena Cords, Enzo Montgomery, MD as Consulting Physician (Allergy and Immunology)  Indicate any recent Medical Services you may have received from other  than [facility] providers in the past year (date may be approximate).     Assessment:   This is a routine wellness examination for Brendan Simpson.  Hearing/Vision screen  Hearing Screening   125Hz  250Hz  500Hz  1000Hz  2000Hz  3000Hz  4000Hz  6000Hz  8000Hz   Right ear:           Left ear:           Comments: Pt wears hearing aids   Vision Screening Comments: Pt has had cataract surgery. [facility]   Dietary issues and exercise activities discussed: Current Exercise Habits: Home exercise routine, Type of exercise: walking;strength training/weights (goes to the [facility]), Time (Minutes): 30, Frequency (Times/Week): 3, Weekly Exercise (Minutes/Week): 90, Intensity: Mild  Goals    . DIET - INCREASE WATER  INTAKE    . DIET - REDUCE SUGAR INTAKE    . Patient Stated     Increase water intake       Depression Screen PHQ 2/9 Scores 04/12/2020 03/24/2020 09/16/2019 12/20/2018 03/20/2018 01/01/2017  PHQ - 2 Score 0 0 0 0 0 0  PHQ- 9 Score - - - 0 - -    Fall Risk Fall Risk  04/12/2020 09/16/2019 03/24/2019 03/20/2018  Falls in the past year? - 0 0 No  Number falls in past yr: 0 - 0 -  Injury with Fall? 0 - 0 -  Risk for fall due to : Impaired balance/gait;Orthopedic patient - - -  Follow up Falls prevention discussed - - -    Any stairs in or around the home? No  If so, are there any without handrails? No  Home free of loose throw rugs in walkways, pet beds, electrical cords, etc? Yes  Adequate lighting in your home to reduce risk of falls? Yes   ASSISTIVE DEVICES UTILIZED TO PREVENT FALLS:  Life alert? No  Use of a cane, walker or w/c? No  Grab bars in the bathroom? Yes  Shower chair or bench in shower? Yes  Elevated toilet seat or a handicapped toilet? No   TIMED UP AND GO:  Was the test performed? Yes .  Length of time to ambulate 10 feet: 10 sec.   Gait steady and fast without use of assistive device  Cognitive Function:     6CIT Screen 04/12/2020  What Year? 0  points  What month? 0 points  What time? 0 points  Count back from 20 0 points  Months in reverse 0 points  Repeat phrase 2 points  Total Score 2    Immunizations Immunization History  Administered Date(s) Administered  . Fluad Quad(high Dose 65+) 06/05/2019  . Influenza, High Dose Seasonal PF 06/21/2018  . Influenza,inj,Quad PF,6+ Mos 07/03/2015, 06/05/2017, 06/05/2017, 07/19/2017  . PFIZER SARS-COV-2 Vaccination 10/22/2019, 11/12/2019  . Pneumococcal Conjugate-13 09/16/2015  . Pneumococcal Polysaccharide-23 03/20/2018  . Tdap 09/19/2006, 03/19/2017  . Zoster 03/05/2013  . Zoster Recombinat (Shingrix) 03/30/2018, 07/08/2018    TDAP status: Up to date Flu Vaccine status: Up to date Pneumococcal vaccine status: Up to date Covid-19 vaccine status: Completed vaccines  Qualifies for Shingles Vaccine? Yes   Zostavax completed Yes   Shingrix Completed?: Yes  Screening Tests Health Maintenance  Topic Date Due  . INFLUENZA VACCINE  05/02/2020  . TETANUS/TDAP  03/20/2027  . COLONOSCOPY  05/13/2028  . COVID-19 Vaccine  Completed  . Hepatitis C Screening  Completed  . PNA vac Low Risk Adult  Completed    Health Maintenance  There are no preventive care reminders to display for this patient.  Colorectal cancer screening: Completed 05/13/18. Repeat every 10 years    Additional Screening:  Hepatitis C Screening:  Completed 03/19/17  Vision Screening: Recommended annual ophthalmology exams for early detection of glaucoma and other disorders of the eye. Is the patient up to date with their annual eye exam?  Yes  Who is the provider or what is the name of the office in which the patient attends annual eye exams? [facility]   Dental Screening: Recommended annual dental exams for proper oral hygiene  Community Resource Referral / Chronic Care Management: CRR required this visit?  No   CCM required this visit?  No      Plan:     I have personally  reviewed and noted the following  in the patient's chart:   . Medical and social history . Use of alcohol, tobacco or illicit drugs  . Current medications and supplements . Functional ability and status . Nutritional status . Physical activity . Advanced directives . List of other physicians . Hospitalizations, surgeries, and ER visits in previous 12 months . Vitals . Screenings to include cognitive, depression, and falls . Referrals and appointments  In addition, I have reviewed and discussed with patient certain preventive protocols, quality metrics, and best practice recommendations. A written personalized care plan for preventive services as well as general preventive health recommendations were provided to patient.     Marzella Schlein, LPN   [id number]/[id number]   Nurse Notes: None

## 2020-04-12 NOTE — Patient Instructions (Addendum)
Brendan Simpson , Thank you for taking time to come for your Medicare Wellness Visit. I appreciate your ongoing commitment to your health goals. Please review the following plan we discussed and let me know if I can assist you in the future.   Screening recommendations/referrals: Colonoscopy: Done 05/13/18 Recommended yearly ophthalmology/optometry visit for glaucoma screening and checkup Recommended yearly dental visit for hygiene and checkup  Vaccinations: Influenza vaccine: Up to date Pneumococcal vaccine: Up to date Tdap vaccine: Up to date Shingles vaccine: Up to date   Covid-19: Completed 1/20 & 11/12/19  Advanced directives: Please bring a copy of your health care power of attorney and living will to the office at your convenience.  Conditions/risks identified: Increase water Intake   Next appointment: Follow up in one year for your annual wellness visit.   Preventive Care 66 Years and Older, Male Preventive care refers to lifestyle choices and visits with your health care provider that can promote health and wellness. What does preventive care include?  A yearly physical exam. This is also called an annual well check.  Dental exams once or twice a year.  Routine eye exams. Ask your health care provider how often you should have your eyes checked.  Personal lifestyle choices, including:  Daily care of your teeth and gums.  Regular physical activity.  Eating a healthy diet.  Avoiding tobacco and drug use.  Limiting alcohol use.  Practicing safe sex.  Taking low doses of aspirin every day.  Taking vitamin and mineral supplements as recommended by your health care provider. What happens during an annual well check? The services and screenings done by your health care provider during your annual well check will depend on your age, overall health, lifestyle risk factors, and family history of disease. Counseling  Your health care provider may ask you questions about  your:  Alcohol use.  Tobacco use.  Drug use.  Emotional well-being.  Home and relationship well-being.  Sexual activity.  Eating habits.  History of falls.  Memory and ability to understand (cognition).  Work and work Astronomer. Screening  You may have the following tests or measurements:  Height, weight, and BMI.  Blood pressure.  Lipid and cholesterol levels. These may be checked every 5 years, or more frequently if you are over 32 years old.  Skin check.  Lung cancer screening. You may have this screening every year starting at age 41 if you have a 30-pack-year history of smoking and currently smoke or have quit within the past 15 years.  Fecal occult blood test (FOBT) of the stool. You may have this test every year starting at age 63.  Flexible sigmoidoscopy or colonoscopy. You may have a sigmoidoscopy every 5 years or a colonoscopy every 10 years starting at age 2.  Prostate cancer screening. Recommendations will vary depending on your family history and other risks.  Hepatitis C blood test.  Hepatitis B blood test.  Sexually transmitted disease (STD) testing.  Diabetes screening. This is done by checking your blood sugar (glucose) after you have not eaten for a while (fasting). You may have this done every 1-3 years.  Abdominal aortic aneurysm (AAA) screening. You may need this if you are a current or former smoker.  Osteoporosis. You may be screened starting at age 26 if you are at high risk. Talk with your health care provider about your test results, treatment options, and if necessary, the need for more tests. Vaccines  Your health care provider may recommend certain vaccines,  such as:  Influenza vaccine. This is recommended every year.  Tetanus, diphtheria, and acellular pertussis (Tdap, Td) vaccine. You may need a Td booster every 10 years.  Zoster vaccine. You may need this after age 63.  Pneumococcal 13-valent conjugate (PCV13) vaccine.  One dose is recommended after age 25.  Pneumococcal polysaccharide (PPSV23) vaccine. One dose is recommended after age 49. Talk to your health care provider about which screenings and vaccines you need and how often you need them. This information is not intended to replace advice given to you by your health care provider. Make sure you discuss any questions you have with your health care provider. Document Released: 10/15/2015 Document Revised: 06/07/2016 Document Reviewed: 07/20/2015 Elsevier Interactive Patient Education  2017 [place] Prevention in the Home Falls can cause injuries. They can happen to people of all ages. There are many things you can do to make your home safe and to help prevent falls. What can I do on the outside of my home?  Regularly fix the edges of walkways and driveways and fix any cracks.  Remove anything that might make you trip as you walk through a door, such as a raised step or threshold.  Trim any bushes or trees on the path to your home.  Use bright outdoor lighting.  Clear any walking paths of anything that might make someone trip, such as rocks or tools.  Regularly check to see if handrails are loose or broken. Make sure that both sides of any steps have handrails.  Any raised decks and porches should have guardrails on the edges.  Have any leaves, snow, or ice cleared regularly.  Use sand or salt on walking paths during winter.  Clean up any spills in your garage right away. This includes oil or grease spills. What can I do in the bathroom?  Use night lights.  Install grab bars by the toilet and in the tub and shower. Do not use towel bars as grab bars.  Use non-skid mats or decals in the tub or shower.  If you need to sit down in the shower, use a plastic, non-slip stool.  Keep the floor dry. Clean up any water that spills on the floor as soon as it happens.  Remove soap buildup in the tub or shower regularly.  Attach bath  mats securely with double-sided non-slip rug tape.  Do not have throw rugs and other things on the floor that can make you trip. What can I do in the bedroom?  Use night lights.  Make sure that you have a light by your bed that is easy to reach.  Do not use any sheets or blankets that are too big for your bed. They should not hang down onto the floor.  Have a firm chair that has side arms. You can use this for support while you get dressed.  Do not have throw rugs and other things on the floor that can make you trip. What can I do in the kitchen?  Clean up any spills right away.  Avoid walking on wet floors.  Keep items that you use a lot in easy-to-reach places.  If you need to reach something above you, use a strong step stool that has a grab bar.  Keep electrical cords out of the way.  Do not use floor polish or wax that makes floors slippery. If you must use wax, use non-skid floor wax.  Do not have throw rugs and other things on  the floor that can make you trip. What can I do with my stairs?  Do not leave any items on the stairs.  Make sure that there are handrails on both sides of the stairs and use them. Fix handrails that are broken or loose. Make sure that handrails are as long as the stairways.  Check any carpeting to make sure that it is firmly attached to the stairs. Fix any carpet that is loose or worn.  Avoid having throw rugs at the top or bottom of the stairs. If you do have throw rugs, attach them to the floor with carpet tape.  Make sure that you have a light switch at the top of the stairs and the bottom of the stairs. If you do not have them, ask someone to add them for you. What else can I do to help prevent falls?  Wear shoes that:  Do not have high heels.  Have rubber bottoms.  Are comfortable and fit you well.  Are closed at the toe. Do not wear sandals.  If you use a stepladder:  Make sure that it is fully opened. Do not climb a closed  stepladder.  Make sure that both sides of the stepladder are locked into place.  Ask someone to hold it for you, if possible.  Clearly mark and make sure that you can see:  Any grab bars or handrails.  First and last steps.  Where the edge of each step is.  Use tools that help you move around (mobility aids) if they are needed. These include:  Canes.  Walkers.  Scooters.  Crutches.  Turn on the lights when you go into a dark area. Replace any light bulbs as soon as they burn out.  Set up your furniture so you have a clear path. Avoid moving your furniture around.  If any of your floors are uneven, fix them.  If there are any pets around you, be aware of where they are.  Review your medicines with your doctor. Some medicines can make you feel dizzy. This can increase your chance of falling. Ask your doctor what other things that you can do to help prevent falls. This information is not intended to replace advice given to you by your health care provider. Make sure you discuss any questions you have with your health care provider. Document Released: 07/15/2009 Document Revised: 02/24/2016 Document Reviewed: 10/23/2014 Elsevier Interactive Patient Education  2017 [employer].

## 2020-04-14 DIAGNOSIS — J3089 Other allergic rhinitis: Secondary | ICD-10-CM | POA: Diagnosis not present

## 2020-04-14 DIAGNOSIS — J301 Allergic rhinitis due to pollen: Secondary | ICD-10-CM | POA: Diagnosis not present

## 2020-04-16 DIAGNOSIS — M7061 Trochanteric bursitis, right hip: Secondary | ICD-10-CM | POA: Diagnosis not present

## 2020-04-16 DIAGNOSIS — M7062 Trochanteric bursitis, left hip: Secondary | ICD-10-CM | POA: Diagnosis not present

## 2020-04-19 DIAGNOSIS — M7062 Trochanteric bursitis, left hip: Secondary | ICD-10-CM | POA: Diagnosis not present

## 2020-04-19 DIAGNOSIS — M7061 Trochanteric bursitis, right hip: Secondary | ICD-10-CM | POA: Diagnosis not present

## 2020-04-19 DIAGNOSIS — M25552 Pain in left hip: Secondary | ICD-10-CM | POA: Diagnosis not present

## 2020-04-19 DIAGNOSIS — R29898 Other symptoms and signs involving the musculoskeletal system: Secondary | ICD-10-CM | POA: Diagnosis not present

## 2020-04-19 DIAGNOSIS — M25551 Pain in right hip: Secondary | ICD-10-CM | POA: Diagnosis not present

## 2020-04-21 DIAGNOSIS — J3089 Other allergic rhinitis: Secondary | ICD-10-CM | POA: Diagnosis not present

## 2020-04-21 DIAGNOSIS — J301 Allergic rhinitis due to pollen: Secondary | ICD-10-CM | POA: Diagnosis not present

## 2020-04-29 DIAGNOSIS — J3089 Other allergic rhinitis: Secondary | ICD-10-CM | POA: Diagnosis not present

## 2020-04-29 DIAGNOSIS — M25551 Pain in right hip: Secondary | ICD-10-CM | POA: Diagnosis not present

## 2020-04-29 DIAGNOSIS — M25552 Pain in left hip: Secondary | ICD-10-CM | POA: Diagnosis not present

## 2020-04-29 DIAGNOSIS — M7062 Trochanteric bursitis, left hip: Secondary | ICD-10-CM | POA: Diagnosis not present

## 2020-04-29 DIAGNOSIS — J301 Allergic rhinitis due to pollen: Secondary | ICD-10-CM | POA: Diagnosis not present

## 2020-04-29 DIAGNOSIS — M7061 Trochanteric bursitis, right hip: Secondary | ICD-10-CM | POA: Diagnosis not present

## 2020-04-29 DIAGNOSIS — R29898 Other symptoms and signs involving the musculoskeletal system: Secondary | ICD-10-CM | POA: Diagnosis not present

## 2020-05-03 DIAGNOSIS — R29898 Other symptoms and signs involving the musculoskeletal system: Secondary | ICD-10-CM | POA: Diagnosis not present

## 2020-05-03 DIAGNOSIS — M7062 Trochanteric bursitis, left hip: Secondary | ICD-10-CM | POA: Diagnosis not present

## 2020-05-03 DIAGNOSIS — M25551 Pain in right hip: Secondary | ICD-10-CM | POA: Diagnosis not present

## 2020-05-03 DIAGNOSIS — M25552 Pain in left hip: Secondary | ICD-10-CM | POA: Diagnosis not present

## 2020-05-03 DIAGNOSIS — M7061 Trochanteric bursitis, right hip: Secondary | ICD-10-CM | POA: Diagnosis not present

## 2020-05-05 DIAGNOSIS — M7062 Trochanteric bursitis, left hip: Secondary | ICD-10-CM | POA: Diagnosis not present

## 2020-05-05 DIAGNOSIS — M7061 Trochanteric bursitis, right hip: Secondary | ICD-10-CM | POA: Diagnosis not present

## 2020-05-05 DIAGNOSIS — M25552 Pain in left hip: Secondary | ICD-10-CM | POA: Diagnosis not present

## 2020-05-05 DIAGNOSIS — M25551 Pain in right hip: Secondary | ICD-10-CM | POA: Diagnosis not present

## 2020-05-05 DIAGNOSIS — R29898 Other symptoms and signs involving the musculoskeletal system: Secondary | ICD-10-CM | POA: Diagnosis not present

## 2020-05-05 DIAGNOSIS — J3089 Other allergic rhinitis: Secondary | ICD-10-CM | POA: Diagnosis not present

## 2020-05-05 DIAGNOSIS — J301 Allergic rhinitis due to pollen: Secondary | ICD-10-CM | POA: Diagnosis not present

## 2020-05-10 DIAGNOSIS — M25552 Pain in left hip: Secondary | ICD-10-CM | POA: Diagnosis not present

## 2020-05-10 DIAGNOSIS — R29898 Other symptoms and signs involving the musculoskeletal system: Secondary | ICD-10-CM | POA: Diagnosis not present

## 2020-05-10 DIAGNOSIS — M7061 Trochanteric bursitis, right hip: Secondary | ICD-10-CM | POA: Diagnosis not present

## 2020-05-10 DIAGNOSIS — M25551 Pain in right hip: Secondary | ICD-10-CM | POA: Diagnosis not present

## 2020-05-10 DIAGNOSIS — M7062 Trochanteric bursitis, left hip: Secondary | ICD-10-CM | POA: Diagnosis not present

## 2020-05-11 DIAGNOSIS — J3089 Other allergic rhinitis: Secondary | ICD-10-CM | POA: Diagnosis not present

## 2020-05-11 DIAGNOSIS — J301 Allergic rhinitis due to pollen: Secondary | ICD-10-CM | POA: Diagnosis not present

## 2020-05-11 DIAGNOSIS — J3081 Allergic rhinitis due to animal (cat) (dog) hair and dander: Secondary | ICD-10-CM | POA: Diagnosis not present

## 2020-05-12 DIAGNOSIS — M7061 Trochanteric bursitis, right hip: Secondary | ICD-10-CM | POA: Diagnosis not present

## 2020-05-12 DIAGNOSIS — M7062 Trochanteric bursitis, left hip: Secondary | ICD-10-CM | POA: Diagnosis not present

## 2020-05-12 DIAGNOSIS — M25552 Pain in left hip: Secondary | ICD-10-CM | POA: Diagnosis not present

## 2020-05-12 DIAGNOSIS — R29898 Other symptoms and signs involving the musculoskeletal system: Secondary | ICD-10-CM | POA: Diagnosis not present

## 2020-05-12 DIAGNOSIS — M25551 Pain in right hip: Secondary | ICD-10-CM | POA: Diagnosis not present

## 2020-05-17 DIAGNOSIS — R29898 Other symptoms and signs involving the musculoskeletal system: Secondary | ICD-10-CM | POA: Diagnosis not present

## 2020-05-17 DIAGNOSIS — M25552 Pain in left hip: Secondary | ICD-10-CM | POA: Diagnosis not present

## 2020-05-17 DIAGNOSIS — M7062 Trochanteric bursitis, left hip: Secondary | ICD-10-CM | POA: Diagnosis not present

## 2020-05-17 DIAGNOSIS — M25551 Pain in right hip: Secondary | ICD-10-CM | POA: Diagnosis not present

## 2020-05-17 DIAGNOSIS — M7061 Trochanteric bursitis, right hip: Secondary | ICD-10-CM | POA: Diagnosis not present

## 2020-05-19 DIAGNOSIS — M25552 Pain in left hip: Secondary | ICD-10-CM | POA: Diagnosis not present

## 2020-05-19 DIAGNOSIS — M7061 Trochanteric bursitis, right hip: Secondary | ICD-10-CM | POA: Diagnosis not present

## 2020-05-19 DIAGNOSIS — R29898 Other symptoms and signs involving the musculoskeletal system: Secondary | ICD-10-CM | POA: Diagnosis not present

## 2020-05-19 DIAGNOSIS — M25551 Pain in right hip: Secondary | ICD-10-CM | POA: Diagnosis not present

## 2020-05-19 DIAGNOSIS — M7062 Trochanteric bursitis, left hip: Secondary | ICD-10-CM | POA: Diagnosis not present

## 2020-05-20 DIAGNOSIS — J3089 Other allergic rhinitis: Secondary | ICD-10-CM | POA: Diagnosis not present

## 2020-05-20 DIAGNOSIS — R3915 Urgency of urination: Secondary | ICD-10-CM | POA: Diagnosis not present

## 2020-05-20 DIAGNOSIS — Z125 Encounter for screening for malignant neoplasm of prostate: Secondary | ICD-10-CM | POA: Diagnosis not present

## 2020-05-20 DIAGNOSIS — J301 Allergic rhinitis due to pollen: Secondary | ICD-10-CM | POA: Diagnosis not present

## 2020-05-24 DIAGNOSIS — R29898 Other symptoms and signs involving the musculoskeletal system: Secondary | ICD-10-CM | POA: Diagnosis not present

## 2020-05-24 DIAGNOSIS — M25552 Pain in left hip: Secondary | ICD-10-CM | POA: Diagnosis not present

## 2020-05-24 DIAGNOSIS — M7062 Trochanteric bursitis, left hip: Secondary | ICD-10-CM | POA: Diagnosis not present

## 2020-05-24 DIAGNOSIS — M25551 Pain in right hip: Secondary | ICD-10-CM | POA: Diagnosis not present

## 2020-05-24 DIAGNOSIS — M7061 Trochanteric bursitis, right hip: Secondary | ICD-10-CM | POA: Diagnosis not present

## 2020-05-26 DIAGNOSIS — J3081 Allergic rhinitis due to animal (cat) (dog) hair and dander: Secondary | ICD-10-CM | POA: Diagnosis not present

## 2020-05-26 DIAGNOSIS — J3089 Other allergic rhinitis: Secondary | ICD-10-CM | POA: Diagnosis not present

## 2020-05-26 DIAGNOSIS — J301 Allergic rhinitis due to pollen: Secondary | ICD-10-CM | POA: Diagnosis not present

## 2020-05-29 ENCOUNTER — Other Ambulatory Visit: Payer: Self-pay | Admitting: Physician Assistant

## 2020-05-31 DIAGNOSIS — M7062 Trochanteric bursitis, left hip: Secondary | ICD-10-CM | POA: Diagnosis not present

## 2020-05-31 DIAGNOSIS — M7061 Trochanteric bursitis, right hip: Secondary | ICD-10-CM | POA: Diagnosis not present

## 2020-06-02 DIAGNOSIS — M7061 Trochanteric bursitis, right hip: Secondary | ICD-10-CM | POA: Diagnosis not present

## 2020-06-02 DIAGNOSIS — R29898 Other symptoms and signs involving the musculoskeletal system: Secondary | ICD-10-CM | POA: Diagnosis not present

## 2020-06-02 DIAGNOSIS — M25551 Pain in right hip: Secondary | ICD-10-CM | POA: Diagnosis not present

## 2020-06-02 DIAGNOSIS — M7062 Trochanteric bursitis, left hip: Secondary | ICD-10-CM | POA: Diagnosis not present

## 2020-06-02 DIAGNOSIS — M25552 Pain in left hip: Secondary | ICD-10-CM | POA: Diagnosis not present

## 2020-06-09 DIAGNOSIS — M7062 Trochanteric bursitis, left hip: Secondary | ICD-10-CM | POA: Diagnosis not present

## 2020-06-09 DIAGNOSIS — M7061 Trochanteric bursitis, right hip: Secondary | ICD-10-CM | POA: Diagnosis not present

## 2020-06-09 DIAGNOSIS — M25552 Pain in left hip: Secondary | ICD-10-CM | POA: Diagnosis not present

## 2020-06-09 DIAGNOSIS — J3089 Other allergic rhinitis: Secondary | ICD-10-CM | POA: Diagnosis not present

## 2020-06-09 DIAGNOSIS — M25551 Pain in right hip: Secondary | ICD-10-CM | POA: Diagnosis not present

## 2020-06-09 DIAGNOSIS — R29898 Other symptoms and signs involving the musculoskeletal system: Secondary | ICD-10-CM | POA: Diagnosis not present

## 2020-06-09 DIAGNOSIS — J301 Allergic rhinitis due to pollen: Secondary | ICD-10-CM | POA: Diagnosis not present

## 2020-06-16 DIAGNOSIS — M7061 Trochanteric bursitis, right hip: Secondary | ICD-10-CM | POA: Diagnosis not present

## 2020-06-16 DIAGNOSIS — M7062 Trochanteric bursitis, left hip: Secondary | ICD-10-CM | POA: Diagnosis not present

## 2020-06-16 DIAGNOSIS — M25552 Pain in left hip: Secondary | ICD-10-CM | POA: Diagnosis not present

## 2020-06-16 DIAGNOSIS — R29898 Other symptoms and signs involving the musculoskeletal system: Secondary | ICD-10-CM | POA: Diagnosis not present

## 2020-06-16 DIAGNOSIS — M25551 Pain in right hip: Secondary | ICD-10-CM | POA: Diagnosis not present

## 2020-07-01 DIAGNOSIS — Z23 Encounter for immunization: Secondary | ICD-10-CM | POA: Diagnosis not present

## 2020-07-07 DIAGNOSIS — J301 Allergic rhinitis due to pollen: Secondary | ICD-10-CM | POA: Diagnosis not present

## 2020-07-07 DIAGNOSIS — J3089 Other allergic rhinitis: Secondary | ICD-10-CM | POA: Diagnosis not present

## 2020-07-08 DIAGNOSIS — I10 Essential (primary) hypertension: Secondary | ICD-10-CM | POA: Diagnosis not present

## 2020-07-08 DIAGNOSIS — Z6825 Body mass index (BMI) 25.0-25.9, adult: Secondary | ICD-10-CM | POA: Diagnosis not present

## 2020-07-27 ENCOUNTER — Other Ambulatory Visit: Payer: Self-pay | Admitting: Family Medicine

## 2020-07-27 ENCOUNTER — Ambulatory Visit (INDEPENDENT_AMBULATORY_CARE_PROVIDER_SITE_OTHER): Payer: Medicare Other | Admitting: Family Medicine

## 2020-07-27 ENCOUNTER — Encounter: Payer: Self-pay | Admitting: Family Medicine

## 2020-07-27 ENCOUNTER — Other Ambulatory Visit: Payer: Self-pay

## 2020-07-27 VITALS — BP 118/64 | HR 66 | Temp 98.1°F | Ht 67.0 in | Wt 169.8 lb

## 2020-07-27 DIAGNOSIS — R319 Hematuria, unspecified: Secondary | ICD-10-CM | POA: Diagnosis not present

## 2020-07-27 LAB — POCT URINALYSIS DIPSTICK
Bilirubin, UA: NEGATIVE
Glucose, UA: NEGATIVE
Ketones, UA: NEGATIVE
Leukocytes, UA: NEGATIVE
Nitrite, UA: NEGATIVE
Protein, UA: NEGATIVE
Spec Grav, UA: 1.01 (ref 1.010–1.025)
Urobilinogen, UA: 0.2 E.U./dL
pH, UA: 6.5 (ref 5.0–8.0)

## 2020-07-27 NOTE — Progress Notes (Signed)
   Brendan Simpson is a 69 y.o. male who presents today for an office visit.  Assessment/Plan:  Painless Hematuria Unclear etiology.  Does have history of BPH and saw urologist a couple months ago.  No signs of urinary retention and is able to pass urine normally.  Has 2+ hemoglobin on UA today.  Given gross hematuria and passing blood clots we will pursue further work-up with CBC, CMET, and CT abdomen/pelvis.  Depending on results may need to see urology prior to December follow-up appointment.     Subjective:  HPI:  Patient here with painless hematuria since yesterday.  Happened suddenly yesterday morning.  Had several large blood clots the past.  Has been able to pass urine normally.  No dysuria.  No abdominal pain.  No fevers or chills.  Had continued hematuria through today.  Still has small specks of blood in urine.  Had some blood in his urine of couple months ago.  Occasionally uses NSAIDs for arthritis but not frequently.        Objective:  Physical Exam: BP 118/64   Pulse 66   Temp 98.1 F (36.7 C) (Temporal)   Ht 5\' 7"  (1.702 m)   Wt 169 lb 12.8 oz (77 kg)   SpO2 100%   BMI 26.59 kg/m   Gen: No acute distress, resting comfortably CV: Regular rate and rhythm with no murmurs appreciated Pulm: Normal work of breathing, clear to auscultation bilaterally with no crackles, wheezes, or rhonchi MSK: No CVA tenderness.  Neuro: Grossly normal, moves all extremities Psych: Normal affect and thought content      Deairra Halleck M. , MD 07/27/2020 1:13 PM

## 2020-07-27 NOTE — Patient Instructions (Signed)
It was very nice to see you today!  You may have had a kidney stone.  We will check blood work today and get you set up to get a CT scan.  Take care, Dr [name]  Please try these tips to maintain a healthy lifestyle:   Eat at least 3 REAL meals and 1-2 snacks per day.  Aim for no more than 5 hours between eating.  If you eat breakfast, please do so within one hour of getting up.    Each meal should contain half fruits/vegetables, one quarter protein, and one quarter carbs (no bigger than a computer mouse)   Cut down on sweet beverages. This includes juice, soda, and sweet tea.     Drink at least 1 glass of water with each meal and aim for at least 8 glasses per day   Exercise at least 150 minutes every week.

## 2020-07-28 LAB — BASIC METABOLIC PANEL
BUN: 14 mg/dL (ref 7–25)
CO2: 29 mmol/L (ref 20–32)
Calcium: 9.8 mg/dL (ref 8.6–10.3)
Chloride: 102 mmol/L (ref 98–110)
Creat: 1.04 mg/dL (ref 0.70–1.25)
Glucose, Bld: 97 mg/dL (ref 65–99)
Potassium: 4.2 mmol/L (ref 3.5–5.3)
Sodium: 140 mmol/L (ref 135–146)

## 2020-07-28 LAB — CBC
HCT: 45.6 % (ref 38.5–50.0)
Hemoglobin: 15.4 g/dL (ref 13.2–17.1)
MCH: 30.8 pg (ref 27.0–33.0)
MCHC: 33.8 g/dL (ref 32.0–36.0)
MCV: 91.2 fL (ref 80.0–100.0)
MPV: 12.1 fL (ref 7.5–12.5)
Platelets: 223 10*3/uL (ref 140–400)
RBC: 5 10*6/uL (ref 4.20–5.80)
RDW: 12.1 % (ref 11.0–15.0)
WBC: 6.7 10*3/uL (ref 3.8–10.8)

## 2020-07-28 NOTE — Progress Notes (Signed)
Please inform patient of the following:  Blood work is NORMAL. We will contact him with results of his ct scan once we have them.  Katina Degree. [name], MD 07/28/2020 9:53 AM

## 2020-08-03 DIAGNOSIS — J3089 Other allergic rhinitis: Secondary | ICD-10-CM | POA: Diagnosis not present

## 2020-08-03 DIAGNOSIS — J301 Allergic rhinitis due to pollen: Secondary | ICD-10-CM | POA: Diagnosis not present

## 2020-08-03 DIAGNOSIS — H1045 Other chronic allergic conjunctivitis: Secondary | ICD-10-CM | POA: Diagnosis not present

## 2020-08-04 ENCOUNTER — Other Ambulatory Visit: Payer: Self-pay

## 2020-08-04 ENCOUNTER — Other Ambulatory Visit: Payer: Medicare Other

## 2020-08-04 ENCOUNTER — Other Ambulatory Visit: Payer: Self-pay | Admitting: *Deleted

## 2020-08-04 DIAGNOSIS — R319 Hematuria, unspecified: Secondary | ICD-10-CM

## 2020-08-05 LAB — URINE CULTURE
MICRO NUMBER:: 11154776
Result:: NO GROWTH
SPECIMEN QUALITY:: ADEQUATE

## 2020-08-06 NOTE — Progress Notes (Signed)
Please inform patient of the following:  Urine culture negative. He does not have a UTI. Will contact him with results of CT scan once we have that back.  Katina Degree. [name], MD 08/06/2020 8:03 AM

## 2020-08-09 ENCOUNTER — Telehealth: Payer: Self-pay

## 2020-08-09 ENCOUNTER — Other Ambulatory Visit: Payer: Self-pay | Admitting: *Deleted

## 2020-08-09 DIAGNOSIS — R319 Hematuria, unspecified: Secondary | ICD-10-CM

## 2020-08-09 NOTE — Telephone Encounter (Signed)
Ct order was changed

## 2020-08-09 NOTE — Telephone Encounter (Signed)
Can we call [facility] or just send it to another facility if they can't do the test?  Katina Degree. [name], MD 08/09/2020 11:38 AM

## 2020-08-09 NOTE — Telephone Encounter (Signed)
Spoke with [facility]  Requesting for CT abdomen pelvic w wo contrast to be change CT abdomen pelvic wo contrast was placed

## 2020-08-09 NOTE — Telephone Encounter (Signed)
See below. Im not sure what is needing to be changed or reordered.

## 2020-08-09 NOTE — Telephone Encounter (Signed)
Pt is scheduled at [facility]

## 2020-08-09 NOTE — Telephone Encounter (Signed)
Pt.'s wife has called to follow up on the status of the order of his CT scan. Pt's wife said they have gone back and forth twice with the order being incorrect. It has been 2 weeks since pt appt and they still haven't been able to get the CT scan. Pt's wife was told on Thursday that she would be called back later that day to address it and pt was never called back. Wife states if the Pt needed a ct scan, then it was an important issue and waiting 2 weeks to get it is unacceptable. I told pt that I understand her frustration and that I would feel the same way. Reassured pt that I would stay on top of this issue and also share it with a supervisor.

## 2020-08-09 NOTE — Telephone Encounter (Signed)
CT scan has to be with and without contrast per guidelines for painless gross hematuia.  I am not sure why [facility] has failed to perform the guideline recommended test that we have been trying to get done for 2 weeks and is insisting on a nonstandard test.  I will place a new order and recommend that he get it done at [facility] or [facility].  Please cancel the order with [facility].  Katina Degree. [name], MD 08/09/2020 12:16 PM

## 2020-08-10 ENCOUNTER — Ambulatory Visit (HOSPITAL_BASED_OUTPATIENT_CLINIC_OR_DEPARTMENT_OTHER): Payer: Medicare Other

## 2020-08-12 ENCOUNTER — Telehealth: Payer: Self-pay | Admitting: Family Medicine

## 2020-08-12 ENCOUNTER — Other Ambulatory Visit: Payer: Self-pay

## 2020-08-12 ENCOUNTER — Encounter (HOSPITAL_BASED_OUTPATIENT_CLINIC_OR_DEPARTMENT_OTHER): Payer: Self-pay

## 2020-08-12 ENCOUNTER — Ambulatory Visit (HOSPITAL_BASED_OUTPATIENT_CLINIC_OR_DEPARTMENT_OTHER)
Admission: RE | Admit: 2020-08-12 | Discharge: 2020-08-12 | Disposition: A | Discharge status: Home / Self Care | Payer: Medicare Other | Source: Ambulatory Visit | Attending: Family Medicine | Admitting: Family Medicine

## 2020-08-12 DIAGNOSIS — K429 Umbilical hernia without obstruction or gangrene: Secondary | ICD-10-CM | POA: Diagnosis not present

## 2020-08-12 DIAGNOSIS — K439 Ventral hernia without obstruction or gangrene: Secondary | ICD-10-CM | POA: Diagnosis not present

## 2020-08-12 DIAGNOSIS — N400 Enlarged prostate without lower urinary tract symptoms: Secondary | ICD-10-CM | POA: Diagnosis not present

## 2020-08-12 DIAGNOSIS — R319 Hematuria, unspecified: Secondary | ICD-10-CM | POA: Insufficient documentation

## 2020-08-12 DIAGNOSIS — R310 Gross hematuria: Secondary | ICD-10-CM | POA: Diagnosis not present

## 2020-08-12 MED ORDER — IOHEXOL 300 MG/ML  SOLN
125.0000 mL | Freq: Once | INTRAMUSCULAR | Status: AC | PRN
  Administered 2020-08-12: 125 mL via INTRAVENOUS

## 2020-08-12 NOTE — Progress Notes (Signed)
Please inform patient of the following:  His CT scan showed enlarged prostate but not much else to explain his symptoms. He needs to follow back up with urology to discuss next steps.  Katina Degree. [name], MD 08/12/2020 4:28 PM

## 2020-08-12 NOTE — Progress Notes (Signed)
  Chronic Care Management   Outreach Note  08/12/2020 Name: SHLOIME KEILMAN MRN: [id number] DOB: 1951/03/11  Referred by: Ardith Dark, MD Reason for referral : No chief complaint on file.   An unsuccessful telephone outreach was attempted today. The patient was referred to the pharmacist for assistance with care management and care coordination.   Follow Up Plan:   Carmell [place] Upstream Scheduler

## 2020-08-24 ENCOUNTER — Other Ambulatory Visit: Payer: Self-pay | Admitting: Family Medicine

## 2020-08-24 ENCOUNTER — Other Ambulatory Visit: Payer: Self-pay | Admitting: Physician Assistant

## 2020-08-24 DIAGNOSIS — I10 Essential (primary) hypertension: Secondary | ICD-10-CM

## 2020-08-24 DIAGNOSIS — K219 Gastro-esophageal reflux disease without esophagitis: Secondary | ICD-10-CM

## 2020-08-24 DIAGNOSIS — E039 Hypothyroidism, unspecified: Secondary | ICD-10-CM

## 2020-08-31 DIAGNOSIS — J3089 Other allergic rhinitis: Secondary | ICD-10-CM | POA: Diagnosis not present

## 2020-08-31 DIAGNOSIS — J301 Allergic rhinitis due to pollen: Secondary | ICD-10-CM | POA: Diagnosis not present

## 2020-09-20 DIAGNOSIS — R3915 Urgency of urination: Secondary | ICD-10-CM | POA: Diagnosis not present

## 2020-09-29 DIAGNOSIS — J3089 Other allergic rhinitis: Secondary | ICD-10-CM | POA: Diagnosis not present

## 2020-09-29 DIAGNOSIS — J301 Allergic rhinitis due to pollen: Secondary | ICD-10-CM | POA: Diagnosis not present

## 2020-10-15 ENCOUNTER — Telehealth: Payer: Self-pay | Admitting: Family Medicine

## 2020-10-15 NOTE — Progress Notes (Signed)
  Chronic Care Management   Outreach Note  10/15/2020 Name: Brendan Simpson MRN: [id number] DOB: January 17, 1951  Referred by: Ardith Dark, MD Reason for referral : No chief complaint on file.   Third unsuccessful telephone outreach was attempted today. The patient was referred to the pharmacist for assistance with care management and care coordination.   Follow Up Plan:   Carmell [place] Upstream Scheduler

## 2020-10-20 ENCOUNTER — Other Ambulatory Visit: Payer: Self-pay

## 2020-10-20 ENCOUNTER — Encounter: Payer: Self-pay | Admitting: Physician Assistant

## 2020-10-20 ENCOUNTER — Ambulatory Visit (INDEPENDENT_AMBULATORY_CARE_PROVIDER_SITE_OTHER): Payer: Medicare Other | Admitting: Physician Assistant

## 2020-10-20 VITALS — BP 138/80 | HR 77 | Temp 97.9°F | Ht 67.0 in | Wt 174.4 lb

## 2020-10-20 DIAGNOSIS — R319 Hematuria, unspecified: Secondary | ICD-10-CM | POA: Diagnosis not present

## 2020-10-20 DIAGNOSIS — M25551 Pain in right hip: Secondary | ICD-10-CM | POA: Diagnosis not present

## 2020-10-20 LAB — POCT URINALYSIS DIPSTICK
Bilirubin, UA: NEGATIVE
Blood, UA: NEGATIVE
Glucose, UA: NEGATIVE
Ketones, UA: NEGATIVE
Leukocytes, UA: NEGATIVE
Nitrite, UA: NEGATIVE
Protein, UA: NEGATIVE
Spec Grav, UA: <=1.005 — AB (ref 1.010–1.025)
Urobilinogen, UA: 0.2 E.U./dL
pH, UA: 6 (ref 5.0–8.0)

## 2020-10-20 LAB — URINALYSIS, MICROSCOPIC ONLY: RBC / HPF: NONE SEEN (ref 0–?)

## 2020-10-20 MED ORDER — DICLOFENAC SODIUM 75 MG PO TBEC: 75.0000 mg | DELAYED_RELEASE_TABLET | Freq: Two times a day (BID) | ORAL | 0 refills | Status: DC

## 2020-10-20 NOTE — Patient Instructions (Signed)
It was great to see you!  Follow-up with Urology at your earliest convenience because I suspect they may want to do more imaging to make sure nothing else is going on. They may need to get more urine studies as well.  I will send your urine off for a culture and will be in touch with the results.  If you develop significant changes in urination, new fever/chills, or other new symptoms in the meantime please let us know ASAP.  Take care,  Jarold Motto PA-C

## 2020-10-20 NOTE — Progress Notes (Signed)
Brendan Simpson is a 70 y.o. male here for a new problem.  I acted as a Neurosurgeon for [employer], PA-C Corky Mull, LPN   History of Present Illness:   Chief Complaint  Patient presents with  . Urinary problem    HPI   Urinary problem Pt noticed hematuria in his urine that started on Sunday. He had dark blood urination at first, and then had some blood clots. He had increased pelvic pressure, frequency, and nocturia. After Tuesday all of his symptoms resolved.    He had similar episode in October 2021 and had enlargement of prostate but no other findings. It was suspected that he passed a kidney stone.  He has since seen Urology a few times, last visit was at end of December for urinary frequency. Was told that his prostate was enlarging and prescribed finasteride 5 mg daily. He read the side effects of this medication and only took once. Instead, he significantly decreased his water intake from 100 oz daily to 60-70 oz daily and states that this has improved his frequency. Due for follow-up with urology in one year per his report. He denies ever having a cystoscopy or other procedure done.  Denies: unintentional weight loss, nausea, vomiting, chills, rectal bleeding   Wt Readings from Last 4 Encounters:  10/20/20 174 lb 6.1 oz (79.1 kg)  07/27/20 169 lb 12.8 oz (77 kg)  04/12/20 169 lb 9.6 oz (76.9 kg)  03/24/20 168 lb 3.2 oz (76.3 kg)    R hip pain Last sw me in 03/02/20 for this and was given rx for diclofenac. This medication competely resolves his pain and he would like refill. Denies: new pain, changes to pain, weakness/numbness/tingling.   Past Medical History:  Diagnosis Date  . Arthritis   . Cataract   . GERD (gastroesophageal reflux disease)   . Gout   . Hyperlipidemia   . Hypertension   . Hypothyroidism   . Seasonal allergies   . Uses hearing aid      Social History   Tobacco Use  . Smoking status: Never Smoker  . Smokeless tobacco: Never Used   Substance Use Topics  . Alcohol use: Yes    Alcohol/week: 0.0 standard drinks    Comment: occasioinal  . Drug use: No    Past Surgical History:  Procedure Laterality Date  . CARPAL TUNNEL RELEASE Bilateral   . CATARACT EXTRACTION, BILATERAL Bilateral   . KNEE ARTHROSCOPY Left     Family History  Problem Relation Age of Onset  . Breast cancer Mother   . Heart disease Father   . Diabetes Sister   . Diabetes Brother   . Colon cancer Neg Hx   . Esophageal cancer Neg Hx   . Rectal cancer Neg Hx   . Stomach cancer Neg Hx     Allergies  Allergen Reactions  . Atorvastatin Other (See Comments)    Statin-myopathy    Current Medications:   Current Outpatient Medications:  .  amLODipine (NORVASC) 10 MG tablet, TAKE 1 TABLET BY MOUTH EVERY DAY, Disp: 90 tablet, Rfl: 2 .  aspirin EC 81 MG tablet, Take 81 mg by mouth daily., Disp: , Rfl:  .  azelaic acid (AZELEX) 20 % cream, Apply topically as needed. After skin is thoroughly washed and patted dry, gently but thoroughly massage a thin film of azelaic acid cream into the affected area twice daily, in the morning and evening., Disp: 50 g, Rfl: 2 .  diclofenac (VOLTAREN) 75 MG  EC tablet, Take 1 tablet (75 mg total) by mouth 2 (two) times daily., Disp: 30 tablet, Rfl: 0 .  EPINEPHrine 0.3 mg/0.3 mL IJ SOAJ injection, See admin instructions., Disp: , Rfl:  .  esomeprazole (NEXIUM) 40 MG capsule, TAKE 1 CAPSULE BY MOUTH EVERY DAY, Disp: 90 capsule, Rfl: 1 .  levothyroxine (SYNTHROID) 88 MCG tablet, TAKE 1 TABLET (88 MCG TOTAL) BY MOUTH DAILY BEFORE BREAKFAST., Disp: 90 tablet, Rfl: 1 .  Multiple Vitamins-Minerals (MENS 50+ MULTI VITAMIN/MIN PO), Take 1 tablet by mouth daily., Disp: , Rfl:  .  Oxymetazoline HCl (NASAL SPRAY 12 HOUR NA), Place 1 spray into both nostrils as needed. , Disp: , Rfl:  .  Probiotic Product (PROBIOTIC DAILY PO), Take 1 tablet by mouth daily., Disp: , Rfl:  .  rosuvastatin (CRESTOR) 40 MG tablet, TAKE 1 TABLET BY  MOUTH EVERY DAY, Disp: 90 tablet, Rfl: 3 .  tamsulosin (FLOMAX) 0.4 MG CAPS capsule, Take 1 capsule (0.4 mg total) by mouth daily. (Patient not taking: No sig reported), Disp: 90 capsule, Rfl: 1   Review of Systems:   ROS  Negative unless otherwise specified per HPI.  Vitals:   Vitals:   10/20/20 1141  BP: 138/80  Pulse: 77  Temp: 97.9 F (36.6 C)  TempSrc: Temporal  SpO2: 99%  Weight: 174 lb 6.1 oz (79.1 kg)  Height: 5\' 7"  (1.702 m)     Body mass index is 27.31 kg/m.  Physical Exam:   Physical Exam Vitals and nursing note reviewed.  Constitutional:      General: He is not in acute distress.    Appearance: He is well-developed. He is not ill-appearing, toxic-appearing or sickly-appearing.  Cardiovascular:     Rate and Rhythm: Normal rate and regular rhythm.     Pulses: Normal pulses.     Heart sounds: Normal heart sounds, S1 normal and S2 normal.     Comments: No LE edema Pulmonary:     Effort: Pulmonary effort is normal.     Breath sounds: Normal breath sounds.  Abdominal:     Tenderness: There is no right CVA tenderness or left CVA tenderness.  Skin:    General: Skin is warm, dry and intact.  Neurological:     Mental Status: He is alert.     GCS: GCS eye subscore is 4. GCS verbal subscore is 5. GCS motor subscore is 6.  Psychiatric:        Mood and Affect: Mood and affect normal.        Speech: Speech normal.        Behavior: Behavior normal. Behavior is cooperative.    Results for orders placed or performed in visit on 10/20/20  POCT urinalysis dipstick  Result Value Ref Range   Color, UA yellow    Clarity, UA clear    Glucose, UA Negative Negative   Bilirubin, UA negative    Ketones, UA negative    Spec Grav, UA <=1.005 (A) 1.010 - 1.025   Blood, UA negative    pH, UA 6.0 5.0 - 8.0   Protein, UA Negative Negative   Urobilinogen, UA 0.2 0.2 or 1.0 E.U./dL   Nitrite, UA negative    Leukocytes, UA Negative Negative   Appearance     Odor       Assessment and Plan:   Pope was seen today for urinary problem.  Diagnoses and all orders for this visit:  Hematuria, unspecified type Resolved and UA today negative. Unclear etiology of symptoms.  Recommend close follow-up with [facility] at his earliest convenience to follow-up on this. Also reviewed worsening precautions, including, but not limited to, changes in urination or new systemic symptoms that may require another in-person visit. He verbalized understanding to this. -     POCT urinalysis dipstick -     Urine Culture  Right hip pain Overall controlled with voltaren. Refill sent today. Follow-up prn, consider PT.  Other orders -     diclofenac (VOLTAREN) 75 MG EC tablet; Take 1 tablet (75 mg total) by mouth 2 (two) times daily.   CMA or LPN served as scribe during this visit. History, Physical, and Plan performed by medical provider. The above documentation has been reviewed and is accurate and complete.   Jarold Motto, PA-C

## 2020-10-21 LAB — URINE CULTURE
MICRO NUMBER:: 11433284
Result:: NO GROWTH
SPECIMEN QUALITY:: ADEQUATE

## 2021-01-13 ENCOUNTER — Ambulatory Visit (INDEPENDENT_AMBULATORY_CARE_PROVIDER_SITE_OTHER): Payer: Medicare Other | Admitting: Family Medicine

## 2021-01-13 ENCOUNTER — Encounter: Payer: Self-pay | Admitting: Family Medicine

## 2021-01-13 ENCOUNTER — Other Ambulatory Visit: Payer: Self-pay

## 2021-01-13 VITALS — BP 117/67 | HR 78 | Temp 98.2°F | Ht 67.0 in | Wt 171.8 lb

## 2021-01-13 DIAGNOSIS — J301 Allergic rhinitis due to pollen: Secondary | ICD-10-CM | POA: Diagnosis not present

## 2021-01-13 DIAGNOSIS — R040 Epistaxis: Secondary | ICD-10-CM

## 2021-01-13 DIAGNOSIS — I10 Essential (primary) hypertension: Secondary | ICD-10-CM | POA: Diagnosis not present

## 2021-01-13 NOTE — Assessment & Plan Note (Signed)
At goal today.  Reassured patient that blood pressure was not the cause of his nosebleed.  We will continue amlodipine 10 mg daily.

## 2021-01-13 NOTE — Patient Instructions (Signed)
It was very nice to see you today!  I am glad that your knee is cleared to stop.  You probably had dry irritated nasal passageways due to allergies and pollen.  Please try using saline nasal spray for the next few weeks.  Let her know if her symptoms return.  Your blood pressure looks good today.  Take care, Dr [name]  PLEASE NOTE:  If you had any lab tests please let us know if you have not heard back within a few days. You may see your results on mychart before we have a chance to review them but we will give you a call once they are reviewed by [place]. If we ordered any referrals today, please let us know if you have not heard from their office within the next week.   Please try these tips to maintain a healthy lifestyle:   Eat at least 3 REAL meals and 1-2 snacks per day.  Aim for no more than 5 hours between eating.  If you eat breakfast, please do so within one hour of getting up.    Each meal should contain half fruits/vegetables, one quarter protein, and one quarter carbs (no bigger than a computer mouse)   Cut down on sweet beverages. This includes juice, soda, and sweet tea.     Drink at least 1 glass of water with each meal and aim for at least 8 glasses per day   Exercise at least 150 minutes every week.

## 2021-01-13 NOTE — Progress Notes (Signed)
   Brendan Simpson is a 70 y.o. male who presents today for an office visit.  Assessment/Plan:  New/Acute Problems: Epistaxis Likely due to nasal mucosa irritation secondary to worsened allergies.  He has been using Flonase chronically which could be contributing as well.  Reassuring that symptoms have resolved.  No recurrent symptoms over the last couple of days.  Reassured patient.  Recommended to use nasal saline for the next few weeks to see if this helps.  He will let me know if symptoms return and we can place referral to ENT.  Discussed reasons to return to care.  Chronic Problems Addressed Today: Seasonal allergic rhinitis due to pollen Worsened recently.  He has stopped Flonase.  Symptoms are currently manageable.  Benign essential hypertension At goal today.  Reassured patient that blood pressure was not the cause of his nosebleed.  We will continue amlodipine 10 mg daily.    Subjective:  HPI:   Patient here with concerns for nosebleeds.  Started 3 weeks ago.  Is happening every morning.  Would subside after a few minutes after placing cotton swab to the area.  He stopped using his Flonase is unsure if this is helpful.  He has had worsened allergies recently due to pollen outbreak.  Otherwise is doing well.       Objective:  Physical Exam: BP 117/67   Pulse 78   Temp 98.2 F (36.8 C) (Temporal)   Ht 5\' 7"  (1.702 m)   Wt 171 lb 12.8 oz (77.9 kg)   SpO2 99%   BMI 26.91 kg/m   Gen: No acute distress, resting comfortably HEENT: TMs clear bilaterally.  Nasal mucosa slightly boggy with clear discharge.  No bleeding or finding blood noted. CV: Regular rate and rhythm with no murmurs appreciated Pulm: Normal work of breathing, clear to auscultation bilaterally with no crackles, wheezes, or rhonchi Neuro: Grossly normal, moves all extremities Psych: Normal affect and thought content      Yovanny Coats M. , MD 01/13/2021 11:44 AM

## 2021-01-13 NOTE — Assessment & Plan Note (Signed)
Worsened recently.  He has stopped Flonase.  Symptoms are currently manageable.

## 2021-02-17 ENCOUNTER — Other Ambulatory Visit: Payer: Self-pay | Admitting: Family Medicine

## 2021-02-17 DIAGNOSIS — K219 Gastro-esophageal reflux disease without esophagitis: Secondary | ICD-10-CM

## 2021-02-17 DIAGNOSIS — E039 Hypothyroidism, unspecified: Secondary | ICD-10-CM

## 2021-03-25 ENCOUNTER — Other Ambulatory Visit: Payer: Self-pay

## 2021-03-25 ENCOUNTER — Ambulatory Visit (INDEPENDENT_AMBULATORY_CARE_PROVIDER_SITE_OTHER): Payer: Medicare Other | Admitting: Family Medicine

## 2021-03-25 ENCOUNTER — Encounter: Payer: Self-pay | Admitting: Family Medicine

## 2021-03-25 VITALS — BP 136/84 | HR 62 | Temp 97.7°F | Wt 174.4 lb

## 2021-03-25 DIAGNOSIS — R351 Nocturia: Secondary | ICD-10-CM

## 2021-03-25 DIAGNOSIS — I10 Essential (primary) hypertension: Secondary | ICD-10-CM

## 2021-03-25 DIAGNOSIS — N401 Enlarged prostate with lower urinary tract symptoms: Secondary | ICD-10-CM | POA: Diagnosis not present

## 2021-03-25 DIAGNOSIS — R7303 Prediabetes: Secondary | ICD-10-CM | POA: Diagnosis not present

## 2021-03-25 DIAGNOSIS — E039 Hypothyroidism, unspecified: Secondary | ICD-10-CM

## 2021-03-25 DIAGNOSIS — R300 Dysuria: Secondary | ICD-10-CM | POA: Diagnosis not present

## 2021-03-25 DIAGNOSIS — E7800 Pure hypercholesterolemia, unspecified: Secondary | ICD-10-CM

## 2021-03-25 DIAGNOSIS — R040 Epistaxis: Secondary | ICD-10-CM

## 2021-03-25 LAB — URINALYSIS, ROUTINE W REFLEX MICROSCOPIC
Bilirubin Urine: NEGATIVE
Hgb urine dipstick: NEGATIVE
Ketones, ur: NEGATIVE
Leukocytes,Ua: NEGATIVE
Nitrite: NEGATIVE
RBC / HPF: NONE SEEN (ref 0–?)
Specific Gravity, Urine: <=1.005 — AB (ref 1.000–1.030)
Total Protein, Urine: NEGATIVE
Urine Glucose: NEGATIVE
Urobilinogen, UA: 0.2 (ref 0.0–1.0)
WBC, UA: NONE SEEN (ref 0–?)
pH: 7 (ref 5.0–8.0)

## 2021-03-25 LAB — COMPREHENSIVE METABOLIC PANEL
ALT: 64 U/L — ABNORMAL HIGH (ref 0–53)
AST: 33 U/L (ref 0–37)
Albumin: 4.7 g/dL (ref 3.5–5.2)
Alkaline Phosphatase: 47 U/L (ref 39–117)
BUN: 13 mg/dL (ref 6–23)
CO2: 30 mEq/L (ref 19–32)
Calcium: 9.5 mg/dL (ref 8.4–10.5)
Chloride: 102 mEq/L (ref 96–112)
Creatinine, Ser: 0.96 mg/dL (ref 0.40–1.50)
GFR: 81.46 mL/min (ref >60.00)
Glucose, Bld: 101 mg/dL — ABNORMAL HIGH (ref 70–99)
Potassium: 4.2 mEq/L (ref 3.5–5.1)
Sodium: 141 mEq/L (ref 135–145)
Total Bilirubin: 0.5 mg/dL (ref 0.2–1.2)
Total Protein: 7.1 g/dL (ref 6.0–8.3)

## 2021-03-25 LAB — LIPID PANEL
Cholesterol: 159 mg/dL (ref 0–200)
HDL: 63.5 mg/dL (ref >39.00)
LDL Cholesterol: 86 mg/dL (ref 0–99)
NonHDL: 95.94
Total CHOL/HDL Ratio: 3
Triglycerides: 50 mg/dL (ref 0.0–149.0)
VLDL: 10 mg/dL (ref 0.0–40.0)

## 2021-03-25 LAB — CBC
HCT: 45.7 % (ref 39.0–52.0)
Hemoglobin: 15.3 g/dL (ref 13.0–17.0)
MCHC: 33.4 g/dL (ref 30.0–36.0)
MCV: 92 fl (ref 78.0–100.0)
Platelets: 174 10*3/uL (ref 150.0–400.0)
RBC: 4.97 Mil/uL (ref 4.22–5.81)
RDW: 13.3 % (ref 11.5–15.5)
WBC: 7 10*3/uL (ref 4.0–10.5)

## 2021-03-25 LAB — TSH: TSH: 4.12 u[IU]/mL (ref 0.35–4.50)

## 2021-03-25 LAB — PSA: PSA: 0.99 ng/mL (ref 0.10–4.00)

## 2021-03-25 LAB — HEMOGLOBIN A1C: Hgb A1c MFr Bld: 6.1 % (ref 4.6–6.5)

## 2021-03-25 NOTE — Progress Notes (Signed)
Chief Complaint:  Brendan Simpson is a 70 y.o. male who presents today for his annual follow up visit.  Assessment/Plan:  New/Acute Problems: Epistaxis No red flags.  Given chronicity of symptoms will place referral to ENT.  Chronic Problems Addressed Today: BPH (benign prostatic hyperplasia) Check PSA today.  Also check UA and urine culture.  Prediabetes Check A1c.  Discussed lifestyle modifications.  Hyperlipidemia On Crestor 40 mg daily.  Check labs today.  Hypothyroidism On Synthroid 88 mcg daily.  We will check labs.  Benign essential hypertension At goal on amlodipine 10 mg daily.   Preventative Healthcare: Check labs today.  Up-to-date on vaccines.  Due for next colonoscopy 2029.  Patient Counseling(The following topics were reviewed and/or handout was given):  -Nutrition: Stressed importance of moderation in sodium/caffeine intake, saturated fat and cholesterol, caloric balance, sufficient intake of fresh fruits, vegetables, and fiber.  -Stressed the importance of regular exercise.   -Substance Abuse: Discussed cessation/primary prevention of tobacco, alcohol, or other drug use; driving or other dangerous activities under the influence; availability of treatment for abuse.   -Injury prevention: Discussed safety belts, safety helmets, smoke detector, smoking near bedding or upholstery.   -Sexuality: Discussed sexually transmitted diseases, partner selection, use of condoms, avoidance of unintended pregnancy and contraceptive alternatives.   -Dental health: Discussed importance of regular tooth brushing, flossing, and dental visits.  -Health maintenance and immunizations reviewed. Please refer to Health maintenance section.  Return to care in 1 year for next preventative visit.     Subjective:  HPI:  He complains of an intermittent nosebleed that re-occurs every two weeks. He wakes up, blows his nose, followed by a few seconds of bleeding and a dried blood clot.  Felt pressure while urinating a few days ago which quickly resolved. Denies burning urination, or blood in urine. He is compliant with medication, with no notable side effects. He had a plan to go to [facility] with his family, but he was unable to go.  Lifestyle Diet: He states has been eating more, and has gained 6 pounds since last year.   Exercise: No stated exercise regimen, but says he has been "exercising pretty good" with his wife.   Depression screen [facility] 2/9 03/25/2021  Decreased Interest 0  Down, Depressed, Hopeless 0  PHQ - 2 Score 0  Altered sleeping -  Tired, decreased energy -  Change in appetite -  Feeling bad or failure about yourself  -  Trouble concentrating -  Moving slowly or fidgety/restless -  Suicidal thoughts -  PHQ-9 Score -  Difficult doing work/chores -    There are no preventive care reminders to display for this patient.   ROS: Per HPI, otherwise a complete review of systems was negative.   PMH:  The following were reviewed and entered/updated in epic: Past Medical History:  Diagnosis Date   Arthritis    Cataract    GERD (gastroesophageal reflux disease)    Gout    Hyperlipidemia    Hypertension    Hypothyroidism    Seasonal allergies    Uses hearing aid    Patient Active Problem List   Diagnosis Date Noted   BPH (benign prostatic hyperplasia) 03/24/2020   Folliculitis 03/24/2020   Seasonal allergic rhinitis due to pollen 12/20/2018   Prediabetes 06/21/2018   Chronic left-sided low back pain with left-sided sciatica 03/20/2018   Arthritis 02/03/2016   Esophageal reflux 02/03/2016   Gout 02/03/2016   Benign essential hypertension 02/03/2016   Hearing  reduced, wears hearing aids bilaterally 02/03/2016   Hypothyroidism 02/03/2016   Hyperlipidemia 02/03/2016   Past Surgical History:  Procedure Laterality Date   CARPAL TUNNEL RELEASE Bilateral    CATARACT EXTRACTION, BILATERAL Bilateral    KNEE ARTHROSCOPY Left     Family History   Problem Relation Age of Onset   Breast cancer Mother    Heart disease Father    Diabetes Sister    Diabetes Brother    Colon cancer Neg Hx    Esophageal cancer Neg Hx    Rectal cancer Neg Hx    Stomach cancer Neg Hx     Medications- reviewed and updated Current Outpatient Medications  Medication Sig Dispense Refill   amLODipine (NORVASC) 10 MG tablet TAKE 1 TABLET BY MOUTH EVERY DAY 90 tablet 2   aspirin EC 81 MG tablet Take 81 mg by mouth daily.     azelaic acid (AZELEX) 20 % cream Apply topically as needed. After skin is thoroughly washed and patted dry, gently but thoroughly massage a thin film of azelaic acid cream into the affected area twice daily, in the morning and evening. 50 g 2   diclofenac (VOLTAREN) 75 MG EC tablet Take 1 tablet (75 mg total) by mouth 2 (two) times daily. 30 tablet 0   EPINEPHrine 0.3 mg/0.3 mL IJ SOAJ injection See admin instructions.     esomeprazole (NEXIUM) 40 MG capsule TAKE 1 CAPSULE BY MOUTH EVERY DAY 90 capsule 1   levothyroxine (SYNTHROID) 88 MCG tablet TAKE 1 TABLET BY MOUTH DAILY BEFORE BREAKFAST. 90 tablet 1   rosuvastatin (CRESTOR) 40 MG tablet TAKE 1 TABLET BY MOUTH EVERY DAY 90 tablet 3   Multiple Vitamins-Minerals (MENS 50+ MULTI VITAMIN/MIN PO) Take 1 tablet by mouth daily. (Patient not taking: Reported on 03/25/2021)     Probiotic Product (PROBIOTIC DAILY PO) Take 1 tablet by mouth daily. (Patient not taking: Reported on 03/25/2021)     No current facility-administered medications for this visit.    Allergies-reviewed and updated Allergies  Allergen Reactions   Atorvastatin Other (See Comments)    Statin-myopathy    Social History   Socioeconomic History   Marital status: Married    Spouse name: Anna Livers   Number of children: 2   Years of education: Not on file   Highest education level: Not on file  Occupational History   Occupation: Facilities manager: [employer]   Occupation: Retired  Tobacco Use    Smoking status: Never   Smokeless tobacco: Never  Substance and Sexual Activity   Alcohol use: Yes    Alcohol/week: 0.0 standard drinks    Comment: occasioinal   Drug use: No   Sexual activity: Yes    Partners: Female    Birth control/protection: None  Other Topics Concern   Not on file  Social History Narrative   [employer] 4 days per week. Aerobic and strength training. Christian. Wife is Scarlette Calico, together nearly 50 years. Adult children are supportive. Will and POA in place. 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    Social Determinants of Health   Financial Resource Strain: Low Risk    Difficulty of Paying Living Expenses: Not hard at all  Food Insecurity: No Food Insecurity   Worried About Programme researcher, broadcasting/film/video in the Last Year: Never true   Ran Out of Food in the Last Year: Never true  Transportation Needs: No Transportation Needs   Lack of Transportation (Medical): No   Lack of Transportation (  Non-Medical): No  Physical Activity: Insufficiently Active   Days of Exercise per Week: 3 days   Minutes of Exercise per Session: 30 min  Stress: No Stress Concern Present   Feeling of Stress : Not at all  Social Connections: Moderately Integrated   Frequency of Communication with Friends and Family: More than three times a week   Frequency of Social Gatherings with Friends and Family: More than three times a week   Attends Religious Services: More than 4 times per year   Active Member of [employer] or Organizations: No   Attends Engineer, structural: Never   Marital Status: Married        Objective:  Physical Exam: BP 136/84   Pulse 62   Temp 97.7 F (36.5 C) (Temporal)   Wt 174 lb 6.4 oz (79.1 kg)   SpO2 99%   BMI 27.31 kg/m   Body mass index is 27.31 kg/m. Wt Readings from Last 3 Encounters:  03/25/21 174 lb 6.4 oz (79.1 kg)  01/13/21 171 lb 12.8 oz (77.9 kg)  10/20/20 174 lb 6.1 oz (79.1 kg)   Gen: NAD, resting comfortably HEENT: TMs normal bilaterally. OP  clear. No thyromegaly noted. Dried blood present in left nostril.  CV: RRR with no murmurs appreciated Pulm: NWOB, CTAB with no crackles, wheezes, or rhonchi  GI: Normal bowel sounds present. Soft, Nontender, Nondistended. MSK: no edema, cyanosis, or clubbing noted Skin: warm, dry Neuro: CN2-12 grossly intact. Strength 5/5 in upper and lower extremities. Reflexes symmetric and intact bilaterally.  Psych: Normal affect and thought content     I,Jordan Kelly,acting as a scribe for Jacquiline Doe, MD.,have documented all relevant documentation on the behalf of Jacquiline Doe, MD,as directed by  Jacquiline Doe, MD while in the presence of Jacquiline Doe, MD.  I, Jacquiline Doe, MD, have reviewed all documentation for this visit. The documentation on 03/25/21 for the exam, diagnosis, procedures, and orders are all accurate and complete.   Katina Degree. [name], MD 03/25/2021 10:08 AM

## 2021-03-25 NOTE — Assessment & Plan Note (Signed)
Check A1c.  Discussed lifestyle modifications. °

## 2021-03-25 NOTE — Patient Instructions (Signed)
It was very nice to see you today!  We will check blood work and a urine sample today.  You are up-to-date on your vaccines.  I will refer you to see an ear nose and throat doctor for your bleeding nose.  Please continue work on your diet and exercise.  I will see you back in a year for your next annual physical.  Please come back to see me sooner if needed.  Take care, Dr [name]  PLEASE NOTE:  If you had any lab tests please let us know if you have not heard back within a few days. You may see your results on mychart before we have a chance to review them but we will give you a call once they are reviewed by [place]. If we ordered any referrals today, please let us know if you have not heard from their office within the next week.   Please try these tips to maintain a healthy lifestyle:  Eat at least 3 REAL meals and 1-2 snacks per day.  Aim for no more than 5 hours between eating.  If you eat breakfast, please do so within one hour of getting up.   Each meal should contain half fruits/vegetables, one quarter protein, and one quarter carbs (no bigger than a computer mouse)  Cut down on sweet beverages. This includes juice, soda, and sweet tea.   Drink at least 1 glass of water with each meal and aim for at least 8 glasses per day  Exercise at least 150 minutes every week.   Preventive Care 34 Years and Older, Male Preventive care refers to lifestyle choices and visits with your health care provider that can promote health and wellness. This includes: A yearly physical exam. This is also called an annual wellness visit. Regular dental and eye exams. Immunizations. Screening for certain conditions. Healthy lifestyle choices, such as: Eating a healthy diet. Getting regular exercise. Not using drugs or products that contain nicotine and tobacco. Limiting alcohol use. What can I expect for my preventive care visit? Physical exam Your health care provider will check your: Height and  weight. These may be used to calculate your BMI (body mass index). BMI is a measurement that tells if you are at a healthy weight. Heart rate and blood pressure. Body temperature. Skin for abnormal spots. Counseling Your health care provider may ask you questions about your: Past medical problems. Family's medical history. Alcohol, tobacco, and drug use. Emotional well-being. Home life and relationship well-being. Sexual activity. Diet, exercise, and sleep habits. History of falls. Memory and ability to understand (cognition). Work and work Astronomer. Access to firearms. What immunizations do I need?  Vaccines are usually given at various ages, according to a schedule. Your health care provider will recommend vaccines for you based on your age, medicalhistory, and lifestyle or other factors, such as travel or where you work. What tests do I need? Blood tests Lipid and cholesterol levels. These may be checked every 5 years, or more often depending on your overall health. Hepatitis C test. Hepatitis B test. Screening Lung cancer screening. You may have this screening every year starting at age 24 if you have a 30-pack-year history of smoking and currently smoke or have quit within the past 15 years. Colorectal cancer screening. All adults should have this screening starting at age 8 and continuing until age 42. Your health care provider may recommend screening at age 73 if you are at increased risk. You will have tests every 1-10  years, depending on your results and the type of screening test. Prostate cancer screening. Recommendations will vary depending on your family history and other risks. Genital exam to check for testicular cancer or hernias. Diabetes screening. This is done by checking your blood sugar (glucose) after you have not eaten for a while (fasting). You may have this done every 1-3 years. Abdominal aortic aneurysm (AAA) screening. You may need this if you are a  current or former smoker. STD (sexually transmitted disease) testing, if you are at risk. Follow these instructions at home: Eating and drinking  Eat a diet that includes fresh fruits and vegetables, whole grains, lean protein, and low-fat dairy products. Limit your intake of foods with high amounts of sugar, saturated fats, and salt. Take vitamin and mineral supplements as recommended by your health care provider. Do not drink alcohol if your health care provider tells you not to drink. If you drink alcohol: Limit how much you have to 0-2 drinks a day. Be aware of how much alcohol is in your drink. In the [place] one drink equals one 12 oz bottle of beer (355 mL), one 5 oz glass of wine (148 mL), or one 1 oz glass of hard liquor (44 mL).  Lifestyle Take daily care of your teeth and gums. Brush your teeth every morning and night with fluoride toothpaste. Floss one time each day. Stay active. Exercise for at least 30 minutes 5 or more days each week. Do not use any products that contain nicotine or tobacco, such as cigarettes, e-cigarettes, and chewing tobacco. If you need help quitting, ask your health care provider. Do not use drugs. If you are sexually active, practice safe sex. Use a condom or other form of protection to prevent STIs (sexually transmitted infections). Talk with your health care provider about taking a low-dose aspirin or statin. Find healthy ways to cope with stress, such as: Meditation, yoga, or listening to music. Journaling. Talking to a trusted person. Spending time with friends and family. Safety Always wear your seat belt while driving or riding in a vehicle. Do not drive: If you have been drinking alcohol. Do not ride with someone who has been drinking. When you are tired or distracted. While texting. Wear a helmet and other protective equipment during sports activities. If you have firearms in your house, make sure you follow all gun safety  procedures. What's next? Visit your health care provider once a year for an annual wellness visit. Ask your health care provider how often you should have your eyes and teeth checked. Stay up to date on all vaccines. This information is not intended to replace advice given to you by your health care provider. Make sure you discuss any questions you have with your healthcare provider. Document Revised: 06/17/2019 Document Reviewed: 09/12/2018 Elsevier Patient Education  2022 [employer].

## 2021-03-25 NOTE — Assessment & Plan Note (Signed)
At goal on amlodipine 10 mg daily. 

## 2021-03-25 NOTE — Assessment & Plan Note (Signed)
On Crestor 40 mg daily.  Check labs today.

## 2021-03-25 NOTE — Assessment & Plan Note (Signed)
On Synthroid 88 mcg daily.  We will check labs.

## 2021-03-25 NOTE — Assessment & Plan Note (Signed)
Check PSA today.  Also check UA and urine culture.

## 2021-03-26 LAB — URINE CULTURE
MICRO NUMBER:: 12047607
Result:: NO GROWTH
SPECIMEN QUALITY:: ADEQUATE

## 2021-03-27 ENCOUNTER — Other Ambulatory Visit: Payer: Self-pay | Admitting: Family Medicine

## 2021-03-27 DIAGNOSIS — E7800 Pure hypercholesterolemia, unspecified: Secondary | ICD-10-CM

## 2021-03-28 ENCOUNTER — Other Ambulatory Visit: Payer: Self-pay

## 2021-03-28 DIAGNOSIS — R748 Abnormal levels of other serum enzymes: Secondary | ICD-10-CM

## 2021-03-28 NOTE — Progress Notes (Signed)
Please inform patient of the following:  Urine test is normal.  Blood sugar is borderline but stable.  One of his liver numbers was up.  Everything else was normal.  I would like for him to come back in 1 to 2 weeks to recheck c-Met to make sure his liver numbers are back to normal.  We can recheck everything else in a year.

## 2021-04-14 ENCOUNTER — Ambulatory Visit: Payer: Medicare Other

## 2021-04-14 ENCOUNTER — Other Ambulatory Visit: Payer: Self-pay

## 2021-04-14 ENCOUNTER — Other Ambulatory Visit (INDEPENDENT_AMBULATORY_CARE_PROVIDER_SITE_OTHER): Payer: Medicare Other

## 2021-04-14 ENCOUNTER — Ambulatory Visit (INDEPENDENT_AMBULATORY_CARE_PROVIDER_SITE_OTHER): Payer: Medicare Other

## 2021-04-14 VITALS — BP 138/68 | HR 72 | Temp 97.8°F | Wt 172.2 lb

## 2021-04-14 DIAGNOSIS — Z0000 Encounter for general adult medical examination without abnormal findings: Secondary | ICD-10-CM

## 2021-04-14 DIAGNOSIS — R7303 Prediabetes: Secondary | ICD-10-CM | POA: Diagnosis not present

## 2021-04-14 DIAGNOSIS — R748 Abnormal levels of other serum enzymes: Secondary | ICD-10-CM

## 2021-04-14 LAB — COMPREHENSIVE METABOLIC PANEL
ALT: 51 U/L (ref 0–53)
AST: 33 U/L (ref 0–37)
Albumin: 4.4 g/dL (ref 3.5–5.2)
Alkaline Phosphatase: 39 U/L (ref 39–117)
BUN: 12 mg/dL (ref 6–23)
CO2: 28 mEq/L (ref 19–32)
Calcium: 9.2 mg/dL (ref 8.4–10.5)
Chloride: 104 mEq/L (ref 96–112)
Creatinine, Ser: 1.06 mg/dL (ref 0.40–1.50)
GFR: 72.3 mL/min (ref >60.00)
Glucose, Bld: 100 mg/dL — ABNORMAL HIGH (ref 70–99)
Potassium: 4.1 mEq/L (ref 3.5–5.1)
Sodium: 139 mEq/L (ref 135–145)
Total Bilirubin: 0.5 mg/dL (ref 0.2–1.2)
Total Protein: 7 g/dL (ref 6.0–8.3)

## 2021-04-14 NOTE — Addendum Note (Signed)
Addended by: Ardith Dark on: 04/14/2021 09:35 AM   Modules accepted: Orders

## 2021-04-14 NOTE — Progress Notes (Signed)
Please inform patient of the following:  Liver numbers are back to normal.  We can recheck again in a year.

## 2021-04-14 NOTE — Patient Instructions (Signed)
[name] , Thank you for taking time to come for your Medicare Wellness Visit. I appreciate your ongoing commitment to your health goals. Please review the following plan we discussed and let me know if I can assist you in the future.   Screening recommendations/referrals: Colonoscopy: Done 05/13/18 repeat in 10 years 05/13/28 Recommended yearly ophthalmology/optometry visit for glaucoma screening and checkup Recommended yearly dental visit for hygiene and checkup  Vaccinations: Influenza vaccine: Due 05/02/21 Pneumococcal vaccine: Completed  Tdap vaccine: Done 03/19/17 repeat in 10 years 03/20/27 Shingles vaccine: Completed 6/29 & 07/08/18   Covid-19: Completed 1/20, 2/10, 9/30, 08/03/20 & 01/04/21  Advanced directives: Advance directive discussed with you today. Even though you declined this today please call our office should you change your mind and we can give you the proper paperwork for you to fill out.   Conditions/risks identified: See a nutritionist   Next appointment: Follow up in one year for your annual wellness visit.   Preventive Care 68 Years and Older, Male Preventive care refers to lifestyle choices and visits with your health care provider that can promote health and wellness. What does preventive care include? A yearly physical exam. This is also called an annual well check. Dental exams once or twice a year. Routine eye exams. Ask your health care provider how often you should have your eyes checked. Personal lifestyle choices, including: Daily care of your teeth and gums. Regular physical activity. Eating a healthy diet. Avoiding tobacco and drug use. Limiting alcohol use. Practicing safe sex. Taking low doses of aspirin every day. Taking vitamin and mineral supplements as recommended by your health care provider. What happens during an annual well check? The services and screenings done by your health care provider during your annual well check will depend on  your age, overall health, lifestyle risk factors, and family history of disease. Counseling  Your health care provider may ask you questions about your: Alcohol use. Tobacco use. Drug use. Emotional well-being. Home and relationship well-being. Sexual activity. Eating habits. History of falls. Memory and ability to understand (cognition). Work and work Astronomer. Screening  You may have the following tests or measurements: Height, weight, and BMI. Blood pressure. Lipid and cholesterol levels. These may be checked every 5 years, or more frequently if you are over 60 years old. Skin check. Lung cancer screening. You may have this screening every year starting at age 68 if you have a 30-pack-year history of smoking and currently smoke or have quit within the past 15 years. Fecal occult blood test (FOBT) of the stool. You may have this test every year starting at age 68. Flexible sigmoidoscopy or colonoscopy. You may have a sigmoidoscopy every 5 years or a colonoscopy every 10 years starting at age 68. Prostate cancer screening. Recommendations will vary depending on your family history and other risks. Hepatitis C blood test. Hepatitis B blood test. Sexually transmitted disease (STD) testing. Diabetes screening. This is done by checking your blood sugar (glucose) after you have not eaten for a while (fasting). You may have this done every 1-3 years. Abdominal aortic aneurysm (AAA) screening. You may need this if you are a current or former smoker. Osteoporosis. You may be screened starting at age 40 if you are at high risk. Talk with your health care provider about your test results, treatment options, and if necessary, the need for more tests. Vaccines  Your health care provider may recommend certain vaccines, such as: Influenza vaccine. This is recommended every year. Tetanus,  diphtheria, and acellular pertussis (Tdap, Td) vaccine. You may need a Td booster every 10 years. Zoster  vaccine. You may need this after age 62. Pneumococcal 13-valent conjugate (PCV13) vaccine. One dose is recommended after age 68. Pneumococcal polysaccharide (PPSV23) vaccine. One dose is recommended after age 65. Talk to your health care provider about which screenings and vaccines you need and how often you need them. This information is not intended to replace advice given to you by your health care provider. Make sure you discuss any questions you have with your health care provider. Document Released: 10/15/2015 Document Revised: 06/07/2016 Document Reviewed: 07/20/2015 Elsevier Interactive Patient Education  2017 [place] Prevention in the Home Falls can cause injuries. They can happen to people of all ages. There are many things you can do to make your home safe and to help prevent falls. What can I do on the outside of my home? Regularly fix the edges of walkways and driveways and fix any cracks. Remove anything that might make you trip as you walk through a door, such as a raised step or threshold. Trim any bushes or trees on the path to your home. Use bright outdoor lighting. Clear any walking paths of anything that might make someone trip, such as rocks or tools. Regularly check to see if handrails are loose or broken. Make sure that both sides of any steps have handrails. Any raised decks and porches should have guardrails on the edges. Have any leaves, snow, or ice cleared regularly. Use sand or salt on walking paths during winter. Clean up any spills in your garage right away. This includes oil or grease spills. What can I do in the bathroom? Use night lights. Install grab bars by the toilet and in the tub and shower. Do not use towel bars as grab bars. Use non-skid mats or decals in the tub or shower. If you need to sit down in the shower, use a plastic, non-slip stool. Keep the floor dry. Clean up any water that spills on the floor as soon as it happens. Remove  soap buildup in the tub or shower regularly. Attach bath mats securely with double-sided non-slip rug tape. Do not have throw rugs and other things on the floor that can make you trip. What can I do in the bedroom? Use night lights. Make sure that you have a light by your bed that is easy to reach. Do not use any sheets or blankets that are too big for your bed. They should not hang down onto the floor. Have a firm chair that has side arms. You can use this for support while you get dressed. Do not have throw rugs and other things on the floor that can make you trip. What can I do in the kitchen? Clean up any spills right away. Avoid walking on wet floors. Keep items that you use a lot in easy-to-reach places. If you need to reach something above you, use a strong step stool that has a grab bar. Keep electrical cords out of the way. Do not use floor polish or wax that makes floors slippery. If you must use wax, use non-skid floor wax. Do not have throw rugs and other things on the floor that can make you trip. What can I do with my stairs? Do not leave any items on the stairs. Make sure that there are handrails on both sides of the stairs and use them. Fix handrails that are broken or loose. Make  sure that handrails are as long as the stairways. Check any carpeting to make sure that it is firmly attached to the stairs. Fix any carpet that is loose or worn. Avoid having throw rugs at the top or bottom of the stairs. If you do have throw rugs, attach them to the floor with carpet tape. Make sure that you have a light switch at the top of the stairs and the bottom of the stairs. If you do not have them, ask someone to add them for you. What else can I do to help prevent falls? Wear shoes that: Do not have high heels. Have rubber bottoms. Are comfortable and fit you well. Are closed at the toe. Do not wear sandals. If you use a stepladder: Make sure that it is fully opened. Do not climb a  closed stepladder. Make sure that both sides of the stepladder are locked into place. Ask someone to hold it for you, if possible. Clearly mark and make sure that you can see: Any grab bars or handrails. First and last steps. Where the edge of each step is. Use tools that help you move around (mobility aids) if they are needed. These include: Canes. Walkers. Scooters. Crutches. Turn on the lights when you go into a dark area. Replace any light bulbs as soon as they burn out. Set up your furniture so you have a clear path. Avoid moving your furniture around. If any of your floors are uneven, fix them. If there are any pets around you, be aware of where they are. Review your medicines with your doctor. Some medicines can make you feel dizzy. This can increase your chance of falling. Ask your doctor what other things that you can do to help prevent falls. This information is not intended to replace advice given to you by your health care provider. Make sure you discuss any questions you have with your health care provider. Document Released: 07/15/2009 Document Revised: 02/24/2016 Document Reviewed: 10/23/2014 Elsevier Interactive Patient Education  2017 [employer].

## 2021-04-14 NOTE — Progress Notes (Addendum)
Subjective:   Brendan Simpson is a 70 y.o. male who presents for Medicare Annual/Subsequent preventive examination.  Review of Systems     Cardiac Risk Factors include: advanced age (>40men, >75 women);hypertension;dyslipidemia;male gender     Objective:    Today's Vitals   04/14/21 0800  BP: 138/68  Pulse: 72  Temp: 97.8 F (36.6 C)  SpO2: 97%  Weight: 172 lb 3.2 oz (78.1 kg)   Body mass index is 26.97 kg/m.  Advanced Directives 04/14/2021 04/12/2020 03/26/2019  Does Patient Have a Medical Advance Directive? No Yes No  Type of Advance Directive - Healthcare Power of [place];Living will -  Does patient want to make changes to medical advance directive? - Yes (MAU/Ambulatory/Procedural Areas - Information given) -  Copy of Healthcare Power of Attorney in Chart? - No - copy requested -  Would patient like information on creating a medical advance directive? No - Patient declined - -    Current Medications (verified) Outpatient Encounter Medications as of 04/14/2021  Medication Sig   amLODipine (NORVASC) 10 MG tablet TAKE 1 TABLET BY MOUTH EVERY DAY   aspirin EC 81 MG tablet Take 81 mg by mouth daily.   azelaic acid (AZELEX) 20 % cream Apply topically as needed. After skin is thoroughly washed and patted dry, gently but thoroughly massage a thin film of azelaic acid cream into the affected area twice daily, in the morning and evening.   EPINEPHrine 0.3 mg/0.3 mL IJ SOAJ injection See admin instructions.   esomeprazole (NEXIUM) 40 MG capsule TAKE 1 CAPSULE BY MOUTH EVERY DAY   levothyroxine (SYNTHROID) 88 MCG tablet TAKE 1 TABLET BY MOUTH DAILY BEFORE BREAKFAST.   rosuvastatin (CRESTOR) 40 MG tablet TAKE 1 TABLET BY MOUTH EVERY DAY   [DISCONTINUED] diclofenac (VOLTAREN) 75 MG EC tablet Take 1 tablet (75 mg total) by mouth 2 (two) times daily. (Patient not taking: Reported on 04/14/2021)   [DISCONTINUED] Multiple Vitamins-Minerals (MENS 50+ MULTI VITAMIN/MIN PO) Take 1 tablet by  mouth daily. (Patient not taking: No sig reported)   [DISCONTINUED] Probiotic Product (PROBIOTIC DAILY PO) Take 1 tablet by mouth daily. (Patient not taking: No sig reported)   No facility-administered encounter medications on file as of 04/14/2021.    Allergies (verified) Atorvastatin   History: Past Medical History:  Diagnosis Date   Arthritis    Cataract    GERD (gastroesophageal reflux disease)    Gout    Hyperlipidemia    Hypertension    Hypothyroidism    Seasonal allergies    Uses hearing aid    Past Surgical History:  Procedure Laterality Date   CARPAL TUNNEL RELEASE Bilateral    CATARACT EXTRACTION, BILATERAL Bilateral    KNEE ARTHROSCOPY Left    Family History  Problem Relation Age of Onset   Breast cancer Mother    Heart disease Father    Diabetes Sister    Diabetes Brother    Colon cancer Neg Hx    Esophageal cancer Neg Hx    Rectal cancer Neg Hx    Stomach cancer Neg Hx    Social History   Socioeconomic History   Marital status: Married    Spouse name: Lewin Pellow   Number of children: 2   Years of education: Not on file   Highest education level: Not on file  Occupational History   Occupation: Facilities manager: [employer]   Occupation: Retired  Tobacco Use   Smoking status: Never   Smokeless  tobacco: Never  Substance and Sexual Activity   Alcohol use: Yes    Alcohol/week: 0.0 standard drinks    Comment: occasioinal   Drug use: No   Sexual activity: Yes    Partners: Female    Birth control/protection: None  Other Topics Concern   Not on file  Social History Narrative   [employer] 4 days per week. Aerobic and strength training. Christian. Wife is Scarlette Calico, together nearly 50 years. Adult children are supportive. Will and POA in place. 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    Social Determinants of Health   Financial Resource Strain: Low Risk    Difficulty of Paying Living Expenses: Not hard at all  Food Insecurity:  No Food Insecurity   Worried About Programme researcher, broadcasting/film/video in the Last Year: Never true   Ran Out of Food in the Last Year: Never true  Transportation Needs: No Transportation Needs   Lack of Transportation (Medical): No   Lack of Transportation (Non-Medical): No  Physical Activity: Sufficiently Active   Days of Exercise per Week: 5 days   Minutes of Exercise per Session: 60 min  Stress: No Stress Concern Present   Feeling of Stress : Not at all  Social Connections: Moderately Integrated   Frequency of Communication with Friends and Family: Once a week   Frequency of Social Gatherings with Friends and Family: Once a week   Attends Religious Services: More than 4 times per year   Active Member of [employer] or Organizations: Yes   Attends Banker Meetings: 1 to 4 times per year   Marital Status: Married    Tobacco Counseling Counseling given: Not Answered   Clinical Intake:  Pre-visit preparation completed: Yes  Pain : No/denies pain     BMI - recorded: 26.97 Nutritional Status: BMI 25 -29 Overweight Nutritional Risks: None Diabetes: No  How often do you need to have someone help you when you read instructions, pamphlets, or other written materials from your doctor or pharmacy?: 1 - Never  Diabetic?No  Interpreter Needed?: No  Information entered by :: Lanier Ensign, LPN   Activities of Daily Living In your present state of health, do you have any difficulty performing the following activities: 04/14/2021  Hearing? Y  Comment wears hearing aids  Vision? N  Difficulty concentrating or making decisions? N  Walking or climbing stairs? N  Dressing or bathing? N  Doing errands, shopping? N  Preparing Food and eating ? N  Using the Toilet? N  In the past six months, have you accidently leaked urine? N  Do you have problems with loss of bowel control? N  Managing your Medications? N  Managing your Finances? N  Housekeeping or managing your Housekeeping? N   Some recent data might be hidden    Patient Care Team: Ardith Dark, MD as PCP - General (Family Medicine) Irena Cords, Enzo Montgomery, MD as Consulting Physician (Allergy and Immunology)  Indicate any recent Medical Services you may have received from other than [facility] providers in the past year (date may be approximate).     Assessment:   This is a routine wellness examination for Kadan.  Hearing/Vision screen Hearing Screening - Comments:: Pt wears hearing aids  Vision Screening - Comments:: Pt follows with [facility] for eye exams   Dietary issues and exercise activities discussed: Current Exercise Habits: Home exercise routine, Type of exercise: strength training/weights;walking, Time (Minutes): 60, Frequency (Times/Week): 5, Weekly Exercise (Minutes/Week): 300   Goals Addressed  This Visit's Progress    Patient Stated       To follow up with a nutritionist        Depression Screen PHQ 2/9 Scores 04/14/2021 03/25/2021 04/12/2020 03/24/2020 09/16/2019 12/20/2018 03/20/2018  PHQ - 2 Score 0 0 0 0 0 0 0  PHQ- 9 Score - - - - - 0 -    Fall Risk Fall Risk  04/14/2021 03/25/2021 01/13/2021 04/12/2020 09/16/2019  Falls in the past year? 0 0 0 - 0  Number falls in past yr: 0 0 - 0 -  Injury with Fall? 0 0 - 0 -  Risk for fall due to : Impaired vision - - Impaired balance/gait;Orthopedic patient -  Follow up Falls prevention discussed - - Falls prevention discussed -    FALL RISK PREVENTION PERTAINING TO THE HOME:  Any stairs in or around the home? No  If so, are there any without handrails? No  Home free of loose throw rugs in walkways, pet beds, electrical cords, etc? Yes  Adequate lighting in your home to reduce risk of falls? Yes   ASSISTIVE DEVICES UTILIZED TO PREVENT FALLS:  Life alert? No  Use of a cane, walker or w/c? No  Grab bars in the bathroom? Yes  Shower chair or bench in shower? Yes  Elevated toilet seat or a handicapped toilet? No   TIMED UP  AND GO:  Was the test performed? Yes .  Length of time to ambulate 10 feet: 10 sec.   Gait steady and fast without use of assistive device  Cognitive Function:     6CIT Screen 04/14/2021 04/12/2020  What Year? 0 points 0 points  What month? 0 points 0 points  What time? 0 points 0 points  Count back from 20 0 points 0 points  Months in reverse 0 points 0 points  Repeat phrase 4 points 2 points  Total Score 4 2    Immunizations Immunization History  Administered Date(s) Administered   Fluad Quad(high Dose 65+) 06/05/2019   Influenza, High Dose Seasonal PF 07/13/2015, 08/01/2017, 06/21/2018, 07/30/2018, 08/03/2020   Influenza,inj,Quad PF,6+ Mos 07/03/2015, 06/05/2017, 06/05/2017, 07/19/2017   Influenza-Unspecified 06/14/2020   PFIZER Comirnaty(Gray Top)Covid-19 Tri-Sucrose Vaccine 01/24/2021   PFIZER(Purple Top)SARS-COV-2 Vaccination 10/22/2019, 11/12/2019, 07/01/2020   Pneumococcal Conjugate-13 09/16/2015   Pneumococcal Polysaccharide-23 03/20/2018, 07/30/2018, 08/03/2020   Tdap 09/19/2006, 03/19/2017   Zoster Recombinat (Shingrix) 03/30/2018, 07/08/2018   Zoster, Live 03/05/2013    TDAP status: Up to date  Flu Vaccine status: Up to date  Pneumococcal vaccine status: Up to date  Covid-19 vaccine status: Completed vaccines  Qualifies for Shingles Vaccine? Yes   Zostavax completed Yes   Shingrix Completed?: Yes  Screening Tests Health Maintenance  Topic Date Due   INFLUENZA VACCINE  05/02/2021   TETANUS/TDAP  03/20/2027   COLONOSCOPY (Pts [phone] Insurance coverage will need to be confirmed)  05/13/2028   COVID-19 Vaccine  Completed   Hepatitis C Screening  Completed   PNA vac Low Risk Adult  Completed   Zoster Vaccines- Shingrix  Completed   HPV VACCINES  Aged Out    Health Maintenance  There are no preventive care reminders to display for this patient.  Colorectal cancer screening: Type of screening: Colonoscopy. Completed 05/13/18. Repeat every 10  years   Additional Screening:  Hepatitis C Screening:  Completed 03/19/17  Vision Screening: Recommended annual ophthalmology exams for early detection of glaucoma and other disorders of the eye. Is the patient up to date with their annual  eye exam?  Yes  Who is the provider or what is the name of the office in which the patient attends annual eye exams? [facility]  If pt is not established with a provider, would they like to be referred to a provider to establish care? No .   Dental Screening: Recommended annual dental exams for proper oral hygiene  Community Resource Referral / Chronic Care Management: CRR required this visit?  No   CCM required this visit?  No      Plan:     I have personally reviewed and noted the following in the patient's chart:   Medical and social history Use of alcohol, tobacco or illicit drugs  Current medications and supplements including opioid prescriptions. Patient is not currently taking opioid prescriptions. Functional ability and status Nutritional status Physical activity Advanced directives List of other physicians Hospitalizations, surgeries, and ER visits in previous 12 months Vitals Screenings to include cognitive, depression, and falls Referrals and appointments  In addition, I have reviewed and discussed with patient certain preventive protocols, quality metrics, and best practice recommendations. A written personalized care plan for preventive services as well as general preventive health recommendations were provided to patient.     Marzella Schlein, LPN   9/56/2130   Nurse Notes: Pt would like to be referred to a nutritionist preferably in [facility] if there is a nutritionist. [place] [id number] area. Please advise

## 2021-05-02 ENCOUNTER — Ambulatory Visit (INDEPENDENT_AMBULATORY_CARE_PROVIDER_SITE_OTHER): Payer: Medicare Other | Admitting: Otolaryngology

## 2021-05-02 ENCOUNTER — Other Ambulatory Visit: Payer: Self-pay

## 2021-05-02 DIAGNOSIS — R040 Epistaxis: Secondary | ICD-10-CM | POA: Diagnosis not present

## 2021-05-02 NOTE — Progress Notes (Signed)
HPI: Brendan Simpson is a 70 y.o. male who presents is referred by his PCP Dr. [name] for evaluation of left-sided epistaxis.  About a month ago he was having frequent left-sided nosebleeds that occurred mostly in the mornings.  He had the nosebleeds for 2 to 3weeks.  He is doing much better presently and has not had any bleeding from the left side over the last 3 weeks.  He is breathing okay.  He presents today for examination of the nose because of left-sided epistaxis.  He does take a baby aspirin daily but no other blood thinners..  Past Medical History:  Diagnosis Date   Arthritis    Cataract    GERD (gastroesophageal reflux disease)    Gout    Hyperlipidemia    Hypertension    Hypothyroidism    Seasonal allergies    Uses hearing aid    Past Surgical History:  Procedure Laterality Date   CARPAL TUNNEL RELEASE Bilateral    CATARACT EXTRACTION, BILATERAL Bilateral    KNEE ARTHROSCOPY Left    Social History   Socioeconomic History   Marital status: Married    Spouse name: Edgerrin Correia   Number of children: 2   Years of education: Not on file   Highest education level: Not on file  Occupational History   Occupation: Facilities manager: [employer]   Occupation: Retired  Tobacco Use   Smoking status: Never   Smokeless tobacco: Never  Substance and Sexual Activity   Alcohol use: Yes    Alcohol/week: 0.0 standard drinks    Comment: occasioinal   Drug use: No   Sexual activity: Yes    Partners: Female    Birth control/protection: None  Other Topics Concern   Not on file  Social History Narrative   [employer] 4 days per week. Aerobic and strength training. Christian. Wife is Scarlette Calico, together nearly 50 years. Adult children are supportive. Will and POA in place. 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    Social Determinants of Health   Financial Resource Strain: Low Risk    Difficulty of Paying Living Expenses: Not hard at all  Food Insecurity: No Food  Insecurity   Worried About Programme researcher, broadcasting/film/video in the Last Year: Never true   Ran Out of Food in the Last Year: Never true  Transportation Needs: No Transportation Needs   Lack of Transportation (Medical): No   Lack of Transportation (Non-Medical): No  Physical Activity: Sufficiently Active   Days of Exercise per Week: 5 days   Minutes of Exercise per Session: 60 min  Stress: No Stress Concern Present   Feeling of Stress : Not at all  Social Connections: Moderately Integrated   Frequency of Communication with Friends and Family: Once a week   Frequency of Social Gatherings with Friends and Family: Once a week   Attends Religious Services: More than 4 times per year   Active Member of [employer] or Organizations: Yes   Attends Banker Meetings: 1 to 4 times per year   Marital Status: Married   Family History  Problem Relation Age of Onset   Breast cancer Mother    Heart disease Father    Diabetes Sister    Diabetes Brother    Colon cancer Neg Hx    Esophageal cancer Neg Hx    Rectal cancer Neg Hx    Stomach cancer Neg Hx    Allergies  Allergen Reactions   Atorvastatin Other (See Comments)  Statin-myopathy   Prior to Admission medications   Medication Sig Start Date End Date Taking? Authorizing Provider  amLODipine (NORVASC) 10 MG tablet TAKE 1 TABLET BY MOUTH EVERY DAY 08/24/20   Ardith Dark, MD  aspirin EC 81 MG tablet Take 81 mg by mouth daily.    [provider]  azelaic acid (AZELEX) 20 % cream Apply topically as needed. After skin is thoroughly washed and patted dry, gently but thoroughly massage a thin film of azelaic acid cream into the affected area twice daily, in the morning and evening. 03/24/20   Ardith Dark, MD  EPINEPHrine 0.3 mg/0.3 mL IJ SOAJ injection See admin instructions. 09/17/19   [provider]  esomeprazole (NEXIUM) 40 MG capsule TAKE 1 CAPSULE BY MOUTH EVERY DAY 02/17/21   Ardith Dark, MD  levothyroxine  (SYNTHROID) 88 MCG tablet TAKE 1 TABLET BY MOUTH DAILY BEFORE BREAKFAST. 02/17/21   Ardith Dark, MD  rosuvastatin (CRESTOR) 40 MG tablet TAKE 1 TABLET BY MOUTH EVERY DAY 03/28/21   Ardith Dark, MD     Positive ROS: Otherwise negative  All other systems have been reviewed and were otherwise negative with the exception of those mentioned in the HPI and as above.  Physical Exam: Constitutional: Alert, well-appearing, no acute distress Ears: External ears without lesions or tenderness. Ear canals are clear bilaterally with intact, clear TMs.  Nasal: External nose without lesions. Septum with minimal deformity and mild rhinitis.  Nasal passages are clear otherwise after decongesting the nose.  Both millimeters regions were clear.  No polyps noted.  No intranasal masses noted..  Normal-appearing vessels along Kiesselbach's plexus and along the septum on each side with no especially prominent vessel and no evidence of recent epistaxis. Oral: Lips and gums without lesions. Tongue and palate mucosa without lesions. Posterior oropharynx clear. Neck: No palpable adenopathy or masses Respiratory: Breathing comfortably  Skin: No facial/neck lesions or rash noted.  Procedures  Assessment: History of epistaxis which is now resolved.  Plan: Briefly reviewed epistaxis with the patient.  If he has any further nosebleeds suggested use of Vaseline or nasal gel to keep moisture within the nasal cavity.  If he has any frequent nosebleeds or persistent bad nosebleed he will follow-up here as needed.  No need for cauterization today.   Narda Bonds, MD   CC:

## 2021-05-07 ENCOUNTER — Encounter (HOSPITAL_BASED_OUTPATIENT_CLINIC_OR_DEPARTMENT_OTHER): Payer: Self-pay

## 2021-05-07 ENCOUNTER — Emergency Department (HOSPITAL_BASED_OUTPATIENT_CLINIC_OR_DEPARTMENT_OTHER)
Admission: EM | Admit: 2021-05-07 | Discharge: 2021-05-07 | Disposition: A | Discharge status: Home / Self Care | Payer: Medicare Other | Attending: Emergency Medicine | Admitting: Emergency Medicine

## 2021-05-07 ENCOUNTER — Other Ambulatory Visit: Payer: Self-pay

## 2021-05-07 ENCOUNTER — Emergency Department (HOSPITAL_BASED_OUTPATIENT_CLINIC_OR_DEPARTMENT_OTHER): Payer: Medicare Other | Admitting: Radiology

## 2021-05-07 DIAGNOSIS — Z7982 Long term (current) use of aspirin: Secondary | ICD-10-CM | POA: Insufficient documentation

## 2021-05-07 DIAGNOSIS — M25511 Pain in right shoulder: Secondary | ICD-10-CM | POA: Diagnosis present

## 2021-05-07 DIAGNOSIS — I10 Essential (primary) hypertension: Secondary | ICD-10-CM | POA: Insufficient documentation

## 2021-05-07 DIAGNOSIS — E039 Hypothyroidism, unspecified: Secondary | ICD-10-CM | POA: Insufficient documentation

## 2021-05-07 DIAGNOSIS — W11XXXA Fall on and from ladder, initial encounter: Secondary | ICD-10-CM | POA: Insufficient documentation

## 2021-05-07 DIAGNOSIS — Z79899 Other long term (current) drug therapy: Secondary | ICD-10-CM | POA: Diagnosis not present

## 2021-05-07 MED ORDER — DICLOFENAC SODIUM 1 % EX GEL: 4.0000 g | Freq: Four times a day (QID) | CUTANEOUS | 0 refills | Status: DC

## 2021-05-07 NOTE — ED Provider Notes (Signed)
[facility] EMERGENCY DEPT Provider Note   CSN: [id number] Arrival date & time: 05/07/21  1324     History Chief Complaint  Patient presents with   Brendan Simpson is a 70 y.o. male.  Patient is a 70 year old male who presents after a fall.  He states yesterday he was washing some windows and was up on a stepladder.  He was only about a foot off the ground.  He fell and landed on his right shoulder/elbow.  He did not hit his head.  There is no loss of consciousness.  No neck or back pain.  No numbness or weakness.  He denies any other injuries from the fall.  He has had prior injuries to that right shoulder and has some pain from time to time.  He is followed by an orthopedist at [facility].  He did take some Aleve and Tylenol yesterday.      Past Medical History:  Diagnosis Date   Arthritis    Cataract    GERD (gastroesophageal reflux disease)    Gout    Hyperlipidemia    Hypertension    Hypothyroidism    Seasonal allergies    Uses hearing aid     Patient Active Problem List   Diagnosis Date Noted   BPH (benign prostatic hyperplasia) 03/24/2020   Folliculitis 03/24/2020   Seasonal allergic rhinitis due to pollen 12/20/2018   Prediabetes 06/21/2018   Chronic left-sided low back pain with left-sided sciatica 03/20/2018   Arthritis 02/03/2016   Esophageal reflux 02/03/2016   Gout 02/03/2016   Benign essential hypertension 02/03/2016   Hearing reduced, wears hearing aids bilaterally 02/03/2016   Hypothyroidism 02/03/2016   Hyperlipidemia 02/03/2016    Past Surgical History:  Procedure Laterality Date   CARPAL TUNNEL RELEASE Bilateral    CATARACT EXTRACTION, BILATERAL Bilateral    KNEE ARTHROSCOPY Left        Family History  Problem Relation Age of Onset   Breast cancer Mother    Heart disease Father    Diabetes Sister    Diabetes Brother    Colon cancer Neg Hx    Esophageal cancer Neg Hx    Rectal cancer Neg Hx    Stomach cancer  Neg Hx     Social History   Tobacco Use   Smoking status: Never   Smokeless tobacco: Never  Substance Use Topics   Alcohol use: Yes    Alcohol/week: 0.0 standard drinks    Comment: occasioinal   Drug use: No    Home Medications Prior to Admission medications   Medication Sig Start Date End Date Taking? Authorizing Provider  diclofenac Sodium (VOLTAREN) 1 % GEL Apply 4 g topically 4 (four) times daily. 05/07/21  Yes Rolan Bucco, MD  amLODipine (NORVASC) 10 MG tablet TAKE 1 TABLET BY MOUTH EVERY DAY 08/24/20   Ardith Dark, MD  aspirin EC 81 MG tablet Take 81 mg by mouth daily.    [provider]  azelaic acid (AZELEX) 20 % cream Apply topically as needed. After skin is thoroughly washed and patted dry, gently but thoroughly massage a thin film of azelaic acid cream into the affected area twice daily, in the morning and evening. 03/24/20   Ardith Dark, MD  EPINEPHrine 0.3 mg/0.3 mL IJ SOAJ injection See admin instructions. 09/17/19   [provider]  esomeprazole (NEXIUM) 40 MG capsule TAKE 1 CAPSULE BY MOUTH EVERY DAY 02/17/21   Ardith Dark, MD  levothyroxine (  SYNTHROID) 88 MCG tablet TAKE 1 TABLET BY MOUTH DAILY BEFORE BREAKFAST. 02/17/21   Ardith Dark, MD  rosuvastatin (CRESTOR) 40 MG tablet TAKE 1 TABLET BY MOUTH EVERY DAY 03/28/21   Ardith Dark, MD    Allergies    Atorvastatin  Review of Systems   Review of Systems  Constitutional:  Negative for fever.  Gastrointestinal:  Negative for nausea and vomiting.  Musculoskeletal:  Positive for arthralgias. Negative for back pain, joint swelling and neck pain.  Skin:  Negative for wound.  Neurological:  Negative for weakness, numbness and headaches.   Physical Exam Updated Vital Signs BP 131/71 (BP Location: Left Arm)   Pulse 66   Temp 98.3 F (36.8 C) (Oral)   Resp 16   Ht 5\' 7"  (1.702 m)   Wt 78.1 kg   SpO2 99%   BMI 26.97 kg/m   Physical Exam Constitutional:      Appearance: He is  well-developed.  HENT:     Head: Normocephalic and atraumatic.  Cardiovascular:     Rate and Rhythm: Normal rate.  Pulmonary:     Effort: Pulmonary effort is normal.  Musculoskeletal:        General: Tenderness present.     Cervical back: Normal range of motion and neck supple.     Comments: Positive tenderness in the right posterior shoulder.  There is no crepitus or deformity.  No external trauma noted.  No pain at the elbow or wrist.  Radial pulses are intact.  He has normal sensation and motor function distally.  He has some pain with AB duction and external rotation.  Skin:    General: Skin is warm and dry.  Neurological:     Mental Status: He is alert and oriented to person, place, and time.    ED Results / Procedures / Treatments   Labs (all labs ordered are listed, but only abnormal results are displayed) Labs Reviewed - No data to display  EKG None  Radiology DG Shoulder Right  Result Date: 05/07/2021 CLINICAL DATA:  70 year old male with history of trauma from a fall off of a ladder complaining of right-sided shoulder pain. EXAM: RIGHT SHOULDER - 2+ VIEW COMPARISON:  No priors. FINDINGS: There is no evidence of fracture or dislocation. Degenerative changes of osteoarthritis are noted in the glenohumeral and acromioclavicular joints. Soft tissues are unremarkable. IMPRESSION: Negative. Electronically Signed   By: 75 M.D.   On: 05/07/2021 08:33    Procedures Procedures   Medications Ordered in ED Medications - No data to display  ED Course  I have reviewed the triage vital signs and the nursing notes.  Pertinent labs & imaging results that were available during my care of the patient were reviewed by me and considered in my medical decision making (see chart for details).    MDM Rules/Calculators/A&P                           Patient is a 70 year old male who presents with right shoulder pain after a fall.  He is neurovascularly intact.  X-rays  reveal no evidence of fracture or or dislocation.  He was discharged home in good condition.  He will continue take Advil and Motrin for pain and I gave him prescription for Voltaren gel.  He has an orthopedist with [facility] that he will follow-up with.  Return precautions were given. Final Clinical Impression(s) / ED Diagnoses Final diagnoses:  Acute pain  of right shoulder    Rx / DC Orders ED Discharge Orders          Ordered    diclofenac Sodium (VOLTAREN) 1 % GEL  4 times daily        05/07/21 0903             Rolan Bucco, MD 05/07/21 [id number]

## 2021-05-07 NOTE — ED Notes (Signed)
Patient is not able to raise his right arm above his head.

## 2021-05-07 NOTE — ED Triage Notes (Signed)
Patient here POV from Home with Shoulder Pain.  Patient was on a Ladder yesterday evening and fell (Patient was on the Second Rung) onto Grass. Patient states he landed on his Right Arm and is complaining of Pain mainly to Upper Right Arm and Right Shoulder.  Ambulatory, GCS 15. No Hx of Blood Thinners. No Head or Neck Injury.

## 2021-06-24 ENCOUNTER — Other Ambulatory Visit: Payer: Self-pay | Admitting: Family Medicine

## 2021-06-24 DIAGNOSIS — I10 Essential (primary) hypertension: Secondary | ICD-10-CM

## 2021-08-03 ENCOUNTER — Other Ambulatory Visit: Payer: Self-pay | Admitting: Allergy and Immunology

## 2021-08-03 ENCOUNTER — Ambulatory Visit
Admission: RE | Admit: 2021-08-03 | Discharge: 2021-08-03 | Disposition: A | Discharge status: Home / Self Care | Payer: 59 | Source: Ambulatory Visit | Attending: Allergy and Immunology | Admitting: Allergy and Immunology

## 2021-08-03 DIAGNOSIS — R059 Cough, unspecified: Secondary | ICD-10-CM

## 2021-08-14 ENCOUNTER — Other Ambulatory Visit: Payer: Self-pay | Admitting: Family Medicine

## 2021-08-14 DIAGNOSIS — K219 Gastro-esophageal reflux disease without esophagitis: Secondary | ICD-10-CM

## 2021-08-14 DIAGNOSIS — E039 Hypothyroidism, unspecified: Secondary | ICD-10-CM

## 2021-08-19 ENCOUNTER — Other Ambulatory Visit: Payer: Self-pay

## 2021-08-19 ENCOUNTER — Ambulatory Visit (INDEPENDENT_AMBULATORY_CARE_PROVIDER_SITE_OTHER): Payer: Medicare Other | Admitting: Family

## 2021-08-19 ENCOUNTER — Encounter: Payer: Self-pay | Admitting: Family

## 2021-08-19 VITALS — BP 136/88 | HR 76 | Temp 98.9°F | Resp 16 | Wt 176.2 lb

## 2021-08-19 DIAGNOSIS — J301 Allergic rhinitis due to pollen: Secondary | ICD-10-CM

## 2021-08-19 DIAGNOSIS — J019 Acute sinusitis, unspecified: Secondary | ICD-10-CM | POA: Insufficient documentation

## 2021-08-19 MED ORDER — FLUTICASONE PROPIONATE 50 MCG/ACT NA SUSP: 1.0000 | Freq: Every day | NASAL | 6 refills | Status: DC

## 2021-08-19 NOTE — Assessment & Plan Note (Signed)
Reports being treated for right otitis media on 11/2, felt better, then sinus sx started almost 2 weeks ago. Has chronic allergies, gets a shot monthly, does not use nasal steroid spray or antihistamines. Reports using OTC generic Afrin bid for 5-6 days, then read he should not do that. Sending Flonase nasal spray to use for 2 weeks, pt advised on use & SE.

## 2021-08-19 NOTE — Patient Instructions (Signed)
It was very nice to see you today!  As discussed, I have sent your nasal spray to your pharmacy, start this today.  If symptoms are not resolved in another week, call the office back.   PLEASE NOTE:  If you had any lab tests please let us know if you have not heard back within a few days. You may see your results on MyChart before we have a chance to review them but we will give you a call once they are reviewed by [place]. If we ordered any referrals today, please let us know if you have not heard from their office within the next week.   Please try these tips to maintain a healthy lifestyle:  Eat most of your calories during the day when you are active. Eliminate processed foods including packaged sweets (pies, cakes, cookies), reduce intake of potatoes, white bread, white pasta, and white rice. Look for whole grain options, oat flour or almond flour.  Each meal should contain half fruits/vegetables, one quarter protein, and one quarter carbs (no bigger than a computer mouse).  Cut down on sweet beverages. This includes juice, soda, and sweet tea. Also watch fruit intake, though this is a healthier sweet option, it still contains natural sugar! Limit to 3 servings daily.  Drink at least 1 glass of water with each meal and aim for at least 8 glasses per day  Exercise at least 150 minutes every week.

## 2021-08-19 NOTE — Progress Notes (Signed)
Subjective:     Patient ID: Brendan Simpson, male    DOB: 04/26/1951, 70 y.o.   MRN: [id number]  Chief Complaint  Patient presents with   Sinus Problem   HPI Sinusitis: Patient presents with sinusitis.  His symptoms include nasal congestion, clear rhinorrhea, puffiness of the eyes.  There has not been a history of purulent rhinorrhea, cough, headaches, facial pain. There has been a recent history of acute right otitis media.  Prior antibiotic therapy has included Omnicef. Other medications have included topical decongestant sprays.  He has had allergy testing which was positive for dust, pollens, & mold, and he receives allergy shots monthly.     There are no preventive care reminders to display for this patient.  Past Medical History:  Diagnosis Date   Arthritis    Cataract    GERD (gastroesophageal reflux disease)    Gout    Hyperlipidemia    Hypertension    Hypothyroidism    Seasonal allergies    Uses hearing aid     Past Surgical History:  Procedure Laterality Date   CARPAL TUNNEL RELEASE Bilateral    CATARACT EXTRACTION, BILATERAL Bilateral    KNEE ARTHROSCOPY Left     Outpatient Medications Prior to Visit  Medication Sig Dispense Refill   amLODipine (NORVASC) 10 MG tablet TAKE 1 TABLET BY MOUTH EVERY DAY 90 tablet 2   aspirin EC 81 MG tablet Take 81 mg by mouth daily.     azelaic acid (AZELEX) 20 % cream Apply topically as needed. After skin is thoroughly washed and patted dry, gently but thoroughly massage a thin film of azelaic acid cream into the affected area twice daily, in the morning and evening. 50 g 2   diclofenac Sodium (VOLTAREN) 1 % GEL Apply 4 g topically 4 (four) times daily. 150 g 0   EPINEPHrine 0.3 mg/0.3 mL IJ SOAJ injection See admin instructions.     esomeprazole (NEXIUM) 40 MG capsule TAKE 1 CAPSULE BY MOUTH EVERY DAY 90 capsule 1   levothyroxine (SYNTHROID) 88 MCG tablet TAKE 1 TABLET BY MOUTH EVERY DAY BEFORE BREAKFAST 90 tablet 1    rosuvastatin (CRESTOR) 40 MG tablet TAKE 1 TABLET BY MOUTH EVERY DAY 90 tablet 3   No facility-administered medications prior to visit.    Allergies  Allergen Reactions   Atorvastatin Other (See Comments)    Statin-myopathy        Objective:    Physical Exam Vitals and nursing note reviewed.  Constitutional:      General: He is not in acute distress.    Appearance: Normal appearance.  HENT:     Head: Normocephalic.     Right Ear: Tympanic membrane and ear canal normal.     Left Ear: Tympanic membrane and ear canal normal.     Nose:     Right Turbinates: Swollen.     Left Turbinates: Swollen.     Right Sinus: No maxillary sinus tenderness or frontal sinus tenderness.     Left Sinus: No maxillary sinus tenderness or frontal sinus tenderness.     Mouth/Throat:     Mouth: Mucous membranes are moist.     Pharynx: Oropharyngeal exudate (postnasal drip) present. No pharyngeal swelling or posterior oropharyngeal erythema.  Cardiovascular:     Rate and Rhythm: Normal rate and regular rhythm.  Pulmonary:     Effort: Pulmonary effort is normal.     Breath sounds: Normal breath sounds.  Musculoskeletal:  General: Normal range of motion.     Cervical back: Normal range of motion.  Skin:    General: Skin is warm and dry.  Neurological:     Mental Status: He is alert and oriented to person, place, and time.  Psychiatric:        Mood and Affect: Mood normal.    BP 136/88   Pulse 76   Temp 98.9 F (37.2 C)   Resp 16   Wt 176 lb 3.2 oz (79.9 kg)   SpO2 98%   BMI 27.60 kg/m  Wt Readings from Last 3 Encounters:  08/19/21 176 lb 3.2 oz (79.9 kg)  05/07/21 172 lb 2.9 oz (78.1 kg)  04/14/21 172 lb 3.2 oz (78.1 kg)       Assessment & Plan:   Problem List Items Addressed This Visit       Respiratory   Seasonal allergic rhinitis due to pollen   Relevant Medications   fluticasone (FLONASE) 50 MCG/ACT nasal spray   Acute non-recurrent sinusitis - Primary    Reports  being treated for right otitis media on 11/2, felt better, then sinus sx started almost 2 weeks ago. Has chronic allergies, gets a shot monthly, does not use nasal steroid spray or antihistamines. Reports using OTC generic Afrin bid for 5-6 days, then read he should not do that. Sending Flonase nasal spray to use for 2 weeks, pt advised on use & SE.      Relevant Medications   fluticasone (FLONASE) 50 MCG/ACT nasal spray    Meds ordered this encounter  Medications   fluticasone (FLONASE) 50 MCG/ACT nasal spray    Sig: Place 1 spray into both nostrils daily. START with 1 spray each nostril TWICE a day for 1 week, then reduce to 1 spray each nostril daily until symptoms are resolved.    Dispense:  16 g    Refill:  6    Order Specific Question:   Supervising Provider    Answer:   ANDY, CAMILLE L [[id number]]

## 2021-12-14 HISTORY — PX: REPLACEMENT TOTAL KNEE BILATERAL: SUR1225

## 2021-12-20 ENCOUNTER — Telehealth: Payer: Self-pay | Admitting: Family Medicine

## 2021-12-20 NOTE — Telephone Encounter (Signed)
Please advise 

## 2021-12-20 NOTE — Telephone Encounter (Signed)
He should take as much miralax as needed to have abowel movement. It is ok to take 2 or 3 daily doses if needed. Recommend office visit if still not improving. ? ?Katina Degree. [name], MD ?12/20/2021 1:34 PM  ? ?

## 2021-12-20 NOTE — Telephone Encounter (Signed)
Patient stated he had surgery on 3/15 for joint replacement- he has not had a bowel movement since 3/15 before surgery. Patient was recommended to take miralax of which doesn't help and magnesium but pharmacies dont sell such over the counter anymore.  ? ?Patient would like to know what can he take for bowel movement - tomorrow will be 1 full week without a bowel movement.  ?

## 2021-12-20 NOTE — Telephone Encounter (Signed)
Called pt and verified DOB, pt advised of recommendations and pt had bowel movement earlier today. Pt will call if needed again in future for appt.  ?

## 2021-12-26 ENCOUNTER — Other Ambulatory Visit: Payer: Self-pay | Admitting: Family Medicine

## 2021-12-26 DIAGNOSIS — K219 Gastro-esophageal reflux disease without esophagitis: Secondary | ICD-10-CM

## 2022-01-02 ENCOUNTER — Other Ambulatory Visit: Payer: Self-pay | Admitting: Family Medicine

## 2022-01-02 DIAGNOSIS — I10 Essential (primary) hypertension: Secondary | ICD-10-CM

## 2022-01-18 ENCOUNTER — Telehealth: Payer: Self-pay | Admitting: Family Medicine

## 2022-01-18 NOTE — Telephone Encounter (Signed)
PT states [facility] medical records is waiting for form authorization from Dr. [name]. Form was faxed to [facility] by insurance company for short term disability. ? ?[facility] department has posted ROI to pt's documents on 01/02/22 ? ?[facility] fax: [phone] ?[facility] Phone: [phone] ? ? ?Please call PT with update. ?

## 2022-01-30 NOTE — Telephone Encounter (Signed)
Spoke with patient and he states he has received everything he needed and was waiting for and no other concerns or needs at this time.  ?

## 2022-02-10 ENCOUNTER — Other Ambulatory Visit: Payer: Self-pay | Admitting: Family Medicine

## 2022-02-10 DIAGNOSIS — E039 Hypothyroidism, unspecified: Secondary | ICD-10-CM

## 2022-03-14 ENCOUNTER — Encounter: Payer: Self-pay | Admitting: Family Medicine

## 2022-03-19 ENCOUNTER — Other Ambulatory Visit: Payer: Self-pay | Admitting: Family Medicine

## 2022-03-19 DIAGNOSIS — E7800 Pure hypercholesterolemia, unspecified: Secondary | ICD-10-CM

## 2022-03-31 ENCOUNTER — Encounter: Payer: Medicare Other | Admitting: Family Medicine

## 2022-04-07 ENCOUNTER — Encounter: Payer: Self-pay | Admitting: Family Medicine

## 2022-04-07 ENCOUNTER — Ambulatory Visit (INDEPENDENT_AMBULATORY_CARE_PROVIDER_SITE_OTHER): Payer: Medicare HMO | Admitting: Family Medicine

## 2022-04-07 VITALS — BP 132/80 | HR 83 | Temp 98.0°F | Ht 67.0 in | Wt 174.6 lb

## 2022-04-07 DIAGNOSIS — R7303 Prediabetes: Secondary | ICD-10-CM

## 2022-04-07 DIAGNOSIS — E039 Hypothyroidism, unspecified: Secondary | ICD-10-CM | POA: Diagnosis not present

## 2022-04-07 DIAGNOSIS — Z125 Encounter for screening for malignant neoplasm of prostate: Secondary | ICD-10-CM

## 2022-04-07 DIAGNOSIS — I10 Essential (primary) hypertension: Secondary | ICD-10-CM

## 2022-04-07 DIAGNOSIS — E7800 Pure hypercholesterolemia, unspecified: Secondary | ICD-10-CM

## 2022-04-07 DIAGNOSIS — Z0001 Encounter for general adult medical examination with abnormal findings: Secondary | ICD-10-CM

## 2022-04-07 DIAGNOSIS — M1990 Unspecified osteoarthritis, unspecified site: Secondary | ICD-10-CM

## 2022-04-07 LAB — COMPREHENSIVE METABOLIC PANEL
ALT: 31 U/L (ref 0–53)
AST: 22 U/L (ref 0–37)
Albumin: 4.7 g/dL (ref 3.5–5.2)
Alkaline Phosphatase: 46 U/L (ref 39–117)
BUN: 13 mg/dL (ref 6–23)
CO2: 29 mEq/L (ref 19–32)
Calcium: 9.6 mg/dL (ref 8.4–10.5)
Chloride: 101 mEq/L (ref 96–112)
Creatinine, Ser: 1.06 mg/dL (ref 0.40–1.50)
GFR: 71.8 mL/min (ref >60.00)
Glucose, Bld: 105 mg/dL — ABNORMAL HIGH (ref 70–99)
Potassium: 4.1 mEq/L (ref 3.5–5.1)
Sodium: 139 mEq/L (ref 135–145)
Total Bilirubin: 0.5 mg/dL (ref 0.2–1.2)
Total Protein: 7.2 g/dL (ref 6.0–8.3)

## 2022-04-07 LAB — CBC
HCT: 42.2 % (ref 39.0–52.0)
Hemoglobin: 14.1 g/dL (ref 13.0–17.0)
MCHC: 33.5 g/dL (ref 30.0–36.0)
MCV: 87.3 fl (ref 78.0–100.0)
Platelets: 201 10*3/uL (ref 150.0–400.0)
RBC: 4.83 Mil/uL (ref 4.22–5.81)
RDW: 14.1 % (ref 11.5–15.5)
WBC: 6.3 10*3/uL (ref 4.0–10.5)

## 2022-04-07 LAB — LIPID PANEL
Cholesterol: 138 mg/dL (ref 0–200)
HDL: 46 mg/dL (ref >39.00)
LDL Cholesterol: 75 mg/dL (ref 0–99)
NonHDL: 92.02
Total CHOL/HDL Ratio: 3
Triglycerides: 87 mg/dL (ref 0.0–149.0)
VLDL: 17.4 mg/dL (ref 0.0–40.0)

## 2022-04-07 LAB — HEMOGLOBIN A1C: Hgb A1c MFr Bld: 6.4 % (ref 4.6–6.5)

## 2022-04-07 LAB — TSH: TSH: 13.65 u[IU]/mL — ABNORMAL HIGH (ref 0.35–5.50)

## 2022-04-07 LAB — PSA: PSA: 1.19 ng/mL (ref 0.10–4.00)

## 2022-04-07 NOTE — Patient Instructions (Addendum)
It was very nice to see you today!  We will check blood work.   Please keep up the good work with your diet and exercise.  We will see you back in year.  Please come back to see [place] sooner if needed.  I is good to go  Take care, Dr [name]  PLEASE NOTE:  If you had any lab tests please let us know if you have not heard back within a few days. You may see your results on mychart before we have a chance to review them but we will give you a call once they are reviewed by [place]. If we ordered any referrals today, please let us know if you have not heard from their office within the next week.   Please try these tips to maintain a healthy lifestyle:  Eat at least 3 REAL meals and 1-2 snacks per day.  Aim for no more than 5 hours between eating.  If you eat breakfast, please do so within one hour of getting up.   Each meal should contain half fruits/vegetables, one quarter protein, and one quarter carbs (no bigger than a computer mouse)  Cut down on sweet beverages. This includes juice, soda, and sweet tea.   Drink at least 1 glass of water with each meal and aim for at least 8 glasses per day  Exercise at least 150 minutes every week.    Preventive Care 49 Years and Older, Male Preventive care refers to lifestyle choices and visits with your health care provider that can promote health and wellness. Preventive care visits are also called wellness exams. What can I expect for my preventive care visit? Counseling During your preventive care visit, your health care provider may ask about your: Medical history, including: Past medical problems. Family medical history. History of falls. Current health, including: Emotional well-being. Home life and relationship well-being. Sexual activity. Memory and ability to understand (cognition). Lifestyle, including: Alcohol, nicotine or tobacco, and drug use. Access to firearms. Diet, exercise, and sleep habits. Work and work  Astronomer. Sunscreen use. Safety issues such as seatbelt and bike helmet use. Physical exam Your health care provider will check your: Height and weight. These may be used to calculate your BMI (body mass index). BMI is a measurement that tells if you are at a healthy weight. Waist circumference. This measures the distance around your waistline. This measurement also tells if you are at a healthy weight and may help predict your risk of certain diseases, such as type 2 diabetes and high blood pressure. Heart rate and blood pressure. Body temperature. Skin for abnormal spots. What immunizations do I need?  Vaccines are usually given at various ages, according to a schedule. Your health care provider will recommend vaccines for you based on your age, medical history, and lifestyle or other factors, such as travel or where you work. What tests do I need? Screening Your health care provider may recommend screening tests for certain conditions. This may include: Lipid and cholesterol levels. Diabetes screening. This is done by checking your blood sugar (glucose) after you have not eaten for a while (fasting). Hepatitis C test. Hepatitis B test. HIV (human immunodeficiency virus) test. STI (sexually transmitted infection) testing, if you are at risk. Lung cancer screening. Colorectal cancer screening. Prostate cancer screening. Abdominal aortic aneurysm (AAA) screening. You may need this if you are a current or former smoker. Talk with your health care provider about your test results, treatment options, and if necessary, the need  for more tests. Follow these instructions at home: Eating and drinking  Eat a diet that includes fresh fruits and vegetables, whole grains, lean protein, and low-fat dairy products. Limit your intake of foods with high amounts of sugar, saturated fats, and salt. Take vitamin and mineral supplements as recommended by your health care provider. Do not drink  alcohol if your health care provider tells you not to drink. If you drink alcohol: Limit how much you have to 0-2 drinks a day. Know how much alcohol is in your drink. In the [place] one drink equals one 12 oz bottle of beer (355 mL), one 5 oz glass of wine (148 mL), or one 1 oz glass of hard liquor (44 mL). Lifestyle Brush your teeth every morning and night with fluoride toothpaste. Floss one time each day. Exercise for at least 30 minutes 5 or more days each week. Do not use any products that contain nicotine or tobacco. These products include cigarettes, chewing tobacco, and vaping devices, such as e-cigarettes. If you need help quitting, ask your health care provider. Do not use drugs. If you are sexually active, practice safe sex. Use a condom or other form of protection to prevent STIs. Take aspirin only as told by your health care provider. Make sure that you understand how much to take and what form to take. Work with your health care provider to find out whether it is safe and beneficial for you to take aspirin daily. Ask your health care provider if you need to take a cholesterol-lowering medicine (statin). Find healthy ways to manage stress, such as: Meditation, yoga, or listening to music. Journaling. Talking to a trusted person. Spending time with friends and family. Safety Always wear your seat belt while driving or riding in a vehicle. Do not drive: If you have been drinking alcohol. Do not ride with someone who has been drinking. When you are tired or distracted. While texting. If you have been using any mind-altering substances or drugs. Wear a helmet and other protective equipment during sports activities. If you have firearms in your house, make sure you follow all gun safety procedures. Minimize exposure to UV radiation to reduce your risk of skin cancer. What's next? Visit your health care provider once a year for an annual wellness visit. Ask your health care  provider how often you should have your eyes and teeth checked. Stay up to date on all vaccines. This information is not intended to replace advice given to you by your health care provider. Make sure you discuss any questions you have with your health care provider. Document Revised: 03/16/2021 Document Reviewed: 03/16/2021 Elsevier Patient Education  2023 [employer].

## 2022-04-07 NOTE — Progress Notes (Signed)
Please inform patient of the following:  His thyroid level off but all of his other labs are stable.  Recommend he come back and recheck the thyroid test 1 more time before we make any dose changes.  Please place future order for TSH.  He can come back next week to have this done.

## 2022-04-07 NOTE — Assessment & Plan Note (Signed)
On Synthroid 88 mcg daily.  Check TSH. 

## 2022-04-07 NOTE — Assessment & Plan Note (Signed)
Check A1c. 

## 2022-04-07 NOTE — Assessment & Plan Note (Signed)
Blood pressure well controlled on amlodipine 10 mg daily.

## 2022-04-07 NOTE — Assessment & Plan Note (Signed)
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

## 2022-04-07 NOTE — Assessment & Plan Note (Signed)
Check lipids.  Continue Crestor 40 mg daily. 

## 2022-04-07 NOTE — Progress Notes (Signed)
Chief Complaint:  Brendan Simpson is a 71 y.o. male who presents today for his annual comprehensive physical exam.    Assessment/Plan:  Chronic Problems Addressed Today: Prediabetes Check A1c.  Hyperlipidemia Check lipids.  Continue Crestor 40 mg daily.  Hypothyroidism On Synthroid 88 mcg daily.  Check TSH.  Benign essential hypertension Blood pressure well controlled on amlodipine 10 mg daily.  Arthritis Doing well status post bilateral knee replacement.  Preventative Healthcare: Up-to-date on vaccines and screenings.  We will check labs today.  Patient Counseling(The following topics were reviewed and/or handout was given):  -Nutrition: Stressed importance of moderation in sodium/caffeine intake, saturated fat and cholesterol, caloric balance, sufficient intake of fresh fruits, vegetables, and fiber.  -Stressed the importance of regular exercise.   -Substance Abuse: Discussed cessation/primary prevention of tobacco, alcohol, or other drug use; driving or other dangerous activities under the influence; availability of treatment for abuse.   -Injury prevention: Discussed safety belts, safety helmets, smoke detector, smoking near bedding or upholstery.   -Sexuality: Discussed sexually transmitted diseases, partner selection, use of condoms, avoidance of unintended pregnancy and contraceptive alternatives.   -Dental health: Discussed importance of regular tooth brushing, flossing, and dental visits.  -Health maintenance and immunizations reviewed. Please refer to Health maintenance section.  Return to care in 1 year for next preventative visit.     Subjective:  HPI:  He has no acute complaints today. HE is recovering from bilateral knee replacement surgery.  This is doing well.  Lifestyle Diet: Balanced. Getting plenty of veggies.  Exercise: Goes 3 days per week to the gym.      04/07/2022    7:59 AM  Depression screen PHQ 2/9  Decreased Interest 0  Down, Depressed,  Hopeless 0  PHQ - 2 Score 0   ROS: Per HPI, otherwise a complete review of systems was negative.   PMH:  The following were reviewed and entered/updated in epic: Past Medical History:  Diagnosis Date   Arthritis    Cataract    GERD (gastroesophageal reflux disease)    Gout    Hyperlipidemia    Hypertension    Hypothyroidism    Seasonal allergies    Uses hearing aid    Patient Active Problem List   Diagnosis Date Noted   BPH (benign prostatic hyperplasia) 03/24/2020   Folliculitis 03/24/2020   Seasonal allergic rhinitis due to pollen 12/20/2018   Prediabetes 06/21/2018   Chronic left-sided low back pain with left-sided sciatica 03/20/2018   Arthritis 02/03/2016   Esophageal reflux 02/03/2016   Gout 02/03/2016   Benign essential hypertension 02/03/2016   Hearing reduced, wears hearing aids bilaterally 02/03/2016   Hypothyroidism 02/03/2016   Hyperlipidemia 02/03/2016   Past Surgical History:  Procedure Laterality Date   CARPAL TUNNEL RELEASE Bilateral    CATARACT EXTRACTION, BILATERAL Bilateral    KNEE ARTHROSCOPY Left    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     azelaic acid (AZELEX) 20 % cream Apply topically as needed. After skin is thoroughly washed and patted dry, gently but thoroughly massage a thin film of azelaic acid cream into the affected area twice daily, in the morning and evening. 50 g 2   diclofenac Sodium (VOLTAREN) 1 % GEL Apply 4 g topically 4 (four) times daily. 150 g 0    EPINEPHrine 0.3 mg/0.3 mL IJ SOAJ injection See admin instructions.     esomeprazole (NEXIUM) 40 MG capsule TAKE 1 CAPSULE BY MOUTH EVERY DAY 90 capsule 1   fluticasone (FLONASE) 50 MCG/ACT nasal spray Place 1 spray into both nostrils daily. START with 1 spray each nostril TWICE a day for 1 week, then reduce to 1 spray each nostril daily until symptoms are resolved. 16 g 6   levothyroxine (SYNTHROID) 88 MCG tablet TAKE 1 TABLET BY MOUTH EVERY DAY BEFORE BREAKFAST 90 tablet 1   rosuvastatin (CRESTOR) 40 MG tablet TAKE 1 TABLET BY MOUTH EVERY DAY 90 tablet 3   No current facility-administered medications for this visit.    Allergies-reviewed and updated Allergies  Allergen Reactions   Atorvastatin Other (See Comments)    Statin-myopathy    Social History   Socioeconomic History   Marital status: Married    Spouse name: Brendan Simpson   Number of children: 2   Years of education: Not on file   Highest education level: Not on file  Occupational History   Occupation: Facilities manager: [employer]   Occupation: Retired  Tobacco Use   Smoking status: Never   Smokeless tobacco: Never  Substance and Sexual Activity   Alcohol use: Yes    Alcohol/week: 0.0 standard drinks of alcohol    Comment: occasioinal   Drug use: No   Sexual activity: Yes    Partners: Female    Birth control/protection: None  Other Topics Concern   Not on file  Social History Narrative   [employer] 4 days per week. Aerobic and strength training. Christian. Wife is Brendan Simpson, together nearly 50 years. Adult children are supportive. Will and POA in place. 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    Social Determinants of Health   Financial Resource Strain: Low Risk  (04/14/2021)   Overall Financial Resource Strain (CARDIA)    Difficulty of Paying Living Expenses: Not hard at all  Food Insecurity: No Food Insecurity (04/14/2021)   Hunger Vital Sign    Worried About Running Out of Food in the  Last Year: Never true    Ran Out of Food in the Last Year: Never true  Transportation Needs: No Transportation Needs (04/14/2021)   PRAPARE - Administrator, Civil Service (Medical): No    Lack of Transportation (Non-Medical): No  Physical Activity: Sufficiently Active (04/14/2021)   Exercise Vital Sign    Days of Exercise per Week: 5 days    Minutes of Exercise per Session: 60 min  Stress: No Stress Concern Present (04/14/2021)   [employer] of Occupational Health - Occupational Stress Questionnaire    Feeling of Stress : Not at all  Social Connections: Moderately Integrated (04/14/2021)   Social Connection and Isolation Panel [NHANES]    Frequency of Communication with Friends and Family: Once a week    Frequency of Social Gatherings with Friends and Family: Once a week    Attends Religious Services: More than 4 times per year    Active Member of [employer] or Organizations:  Yes    Attends Club or Organization Meetings: 1 to 4 times per year    Marital Status: Married        Objective:  Physical Exam: BP 132/80   Pulse 83   Temp 98 F (36.7 C) (Temporal)   Ht 5\' 7"  (1.702 m)   Wt 174 lb 9.6 oz (79.2 kg)   SpO2 96%   BMI 27.35 kg/m   Body mass index is 27.35 kg/m. Wt Readings from Last 3 Encounters:  04/07/22 174 lb 9.6 oz (79.2 kg)  08/19/21 176 lb 3.2 oz (79.9 kg)  05/07/21 172 lb 2.9 oz (78.1 kg)   Gen: NAD, resting comfortably HEENT: TMs normal bilaterally. OP clear. No thyromegaly noted.  CV: RRR with no murmurs appreciated Pulm: NWOB, CTAB with no crackles, wheezes, or rhonchi GI: Normal bowel sounds present. Soft, Nontender, Nondistended. MSK: no edema, cyanosis, or clubbing noted Skin: warm, dry Neuro: CN2-12 grossly intact. Strength 5/5 in upper and lower extremities. Reflexes symmetric and intact bilaterally.  Psych: Normal affect and thought content     Janea Schwenn M. 07/07/21, MD 04/07/2022 8:23 AM

## 2022-04-12 ENCOUNTER — Other Ambulatory Visit: Payer: Self-pay | Admitting: *Deleted

## 2022-04-12 DIAGNOSIS — E039 Hypothyroidism, unspecified: Secondary | ICD-10-CM

## 2022-04-20 ENCOUNTER — Other Ambulatory Visit (INDEPENDENT_AMBULATORY_CARE_PROVIDER_SITE_OTHER): Payer: Medicare HMO

## 2022-04-20 DIAGNOSIS — E039 Hypothyroidism, unspecified: Secondary | ICD-10-CM | POA: Diagnosis not present

## 2022-04-20 LAB — TSH: TSH: 17.26 u[IU]/mL — ABNORMAL HIGH (ref 0.35–5.50)

## 2022-04-21 NOTE — Progress Notes (Signed)
Please inform patient of the following:  Thyroid levels are off.  Please make sure that he is taking his medication properly.  If he has been taking it properly then we need to increase the dose.  Please send in a new prescription for Synthroid 100 mcg daily.  We should see him back in 4 to 6 weeks to recheck TSH.  Please place future order.

## 2022-04-26 ENCOUNTER — Other Ambulatory Visit: Payer: Self-pay | Admitting: *Deleted

## 2022-04-26 DIAGNOSIS — I10 Essential (primary) hypertension: Secondary | ICD-10-CM

## 2022-04-26 DIAGNOSIS — E039 Hypothyroidism, unspecified: Secondary | ICD-10-CM

## 2022-04-26 MED ORDER — LEVOTHYROXINE SODIUM 100 MCG PO TABS: 100.0000 ug | ORAL_TABLET | Freq: Every day | ORAL | 3 refills | Status: DC

## 2022-04-27 ENCOUNTER — Ambulatory Visit: Payer: Medicare Other

## 2022-06-20 ENCOUNTER — Ambulatory Visit: Payer: Medicare HMO

## 2022-06-22 ENCOUNTER — Ambulatory Visit (INDEPENDENT_AMBULATORY_CARE_PROVIDER_SITE_OTHER): Payer: Medicare HMO

## 2022-06-22 DIAGNOSIS — Z0000 Encounter for general adult medical examination without abnormal findings: Secondary | ICD-10-CM

## 2022-06-22 NOTE — Patient Instructions (Signed)
[name] , Thank you for taking time to come for your Medicare Wellness Visit. I appreciate your ongoing commitment to your health goals. Please review the following plan we discussed and let me know if I can assist you in the future.   These are the goals we discussed:  Goals      DIET - INCREASE WATER INTAKE     DIET - REDUCE SUGAR INTAKE     Patient Stated     Increase water intake      Patient Stated     To follow up with a nutritionist         This is a list of the screening recommended for you and due dates:  Health Maintenance  Topic Date Due   Flu Shot  05/02/2022   Tetanus Vaccine  03/20/2027   Colon Cancer Screening  05/13/2028   Pneumonia Vaccine  Completed   COVID-19 Vaccine  Completed   Hepatitis C Screening: USPSTF Recommendation to screen - Ages 95-69 yo.  Completed   Zoster (Shingles) Vaccine  Completed   HPV Vaccine  Aged Out    Advanced directives: Advance directive discussed with you today. I have provided a copy for you to complete at home and have notarized. Once this is complete please bring a copy in to our office so we can scan it into your chart.  Conditions/risks identified: maintain health   Next appointment: Follow up in one year for your annual wellness visit.   Preventive Care 69 Years and Older, Male  Preventive care refers to lifestyle choices and visits with your health care provider that can promote health and wellness. What does preventive care include? A yearly physical exam. This is also called an annual well check. Dental exams once or twice a year. Routine eye exams. Ask your health care provider how often you should have your eyes checked. Personal lifestyle choices, including: Daily care of your teeth and gums. Regular physical activity. Eating a healthy diet. Avoiding tobacco and drug use. Limiting alcohol use. Practicing safe sex. Taking low doses of aspirin every day. Taking vitamin and mineral supplements as  recommended by your health care provider. What happens during an annual well check? The services and screenings done by your health care provider during your annual well check will depend on your age, overall health, lifestyle risk factors, and family history of disease. Counseling  Your health care provider may ask you questions about your: Alcohol use. Tobacco use. Drug use. Emotional well-being. Home and relationship well-being. Sexual activity. Eating habits. History of falls. Memory and ability to understand (cognition). Work and work Statistician. Screening  You may have the following tests or measurements: Height, weight, and BMI. Blood pressure. Lipid and cholesterol levels. These may be checked every 5 years, or more frequently if you are over 32 years old. Skin check. Lung cancer screening. You may have this screening every year starting at age 34 if you have a 30-pack-year history of smoking and currently smoke or have quit within the past 15 years. Fecal occult blood test (FOBT) of the stool. You may have this test every year starting at age 89. Flexible sigmoidoscopy or colonoscopy. You may have a sigmoidoscopy every 5 years or a colonoscopy every 10 years starting at age 67. Prostate cancer screening. Recommendations will vary depending on your family history and other risks. Hepatitis C blood test. Hepatitis B blood test. Sexually transmitted disease (STD) testing. Diabetes screening. This is done by checking your blood sugar (  glucose) after you have not eaten for a while (fasting). You may have this done every 1-3 years. Abdominal aortic aneurysm (AAA) screening. You may need this if you are a current or former smoker. Osteoporosis. You may be screened starting at age 56 if you are at high risk. Talk with your health care provider about your test results, treatment options, and if necessary, the need for more tests. Vaccines  Your health care provider may recommend  certain vaccines, such as: Influenza vaccine. This is recommended every year. Tetanus, diphtheria, and acellular pertussis (Tdap, Td) vaccine. You may need a Td booster every 10 years. Zoster vaccine. You may need this after age 24. Pneumococcal 13-valent conjugate (PCV13) vaccine. One dose is recommended after age 62. Pneumococcal polysaccharide (PPSV23) vaccine. One dose is recommended after age 63. Talk to your health care provider about which screenings and vaccines you need and how often you need them. This information is not intended to replace advice given to you by your health care provider. Make sure you discuss any questions you have with your health care provider. Document Released: 10/15/2015 Document Revised: 06/07/2016 Document Reviewed: 07/20/2015 Elsevier Interactive Patient Education  2017 [place] Prevention in the Home Falls can cause injuries. They can happen to people of all ages. There are many things you can do to make your home safe and to help prevent falls. What can I do on the outside of my home? Regularly fix the edges of walkways and driveways and fix any cracks. Remove anything that might make you trip as you walk through a door, such as a raised step or threshold. Trim any bushes or trees on the path to your home. Use bright outdoor lighting. Clear any walking paths of anything that might make someone trip, such as rocks or tools. Regularly check to see if handrails are loose or broken. Make sure that both sides of any steps have handrails. Any raised decks and porches should have guardrails on the edges. Have any leaves, snow, or ice cleared regularly. Use sand or salt on walking paths during winter. Clean up any spills in your garage right away. This includes oil or grease spills. What can I do in the bathroom? Use night lights. Install grab bars by the toilet and in the tub and shower. Do not use towel bars as grab bars. Use non-skid mats or  decals in the tub or shower. If you need to sit down in the shower, use a plastic, non-slip stool. Keep the floor dry. Clean up any water that spills on the floor as soon as it happens. Remove soap buildup in the tub or shower regularly. Attach bath mats securely with double-sided non-slip rug tape. Do not have throw rugs and other things on the floor that can make you trip. What can I do in the bedroom? Use night lights. Make sure that you have a light by your bed that is easy to reach. Do not use any sheets or blankets that are too big for your bed. They should not hang down onto the floor. Have a firm chair that has side arms. You can use this for support while you get dressed. Do not have throw rugs and other things on the floor that can make you trip. What can I do in the kitchen? Clean up any spills right away. Avoid walking on wet floors. Keep items that you use a lot in easy-to-reach places. If you need to reach something above you, use  a strong step stool that has a grab bar. Keep electrical cords out of the way. Do not use floor polish or wax that makes floors slippery. If you must use wax, use non-skid floor wax. Do not have throw rugs and other things on the floor that can make you trip. What can I do with my stairs? Do not leave any items on the stairs. Make sure that there are handrails on both sides of the stairs and use them. Fix handrails that are broken or loose. Make sure that handrails are as long as the stairways. Check any carpeting to make sure that it is firmly attached to the stairs. Fix any carpet that is loose or worn. Avoid having throw rugs at the top or bottom of the stairs. If you do have throw rugs, attach them to the floor with carpet tape. Make sure that you have a light switch at the top of the stairs and the bottom of the stairs. If you do not have them, ask someone to add them for you. What else can I do to help prevent falls? Wear shoes that: Do not  have high heels. Have rubber bottoms. Are comfortable and fit you well. Are closed at the toe. Do not wear sandals. If you use a stepladder: Make sure that it is fully opened. Do not climb a closed stepladder. Make sure that both sides of the stepladder are locked into place. Ask someone to hold it for you, if possible. Clearly mark and make sure that you can see: Any grab bars or handrails. First and last steps. Where the edge of each step is. Use tools that help you move around (mobility aids) if they are needed. These include: Canes. Walkers. Scooters. Crutches. Turn on the lights when you go into a dark area. Replace any light bulbs as soon as they burn out. Set up your furniture so you have a clear path. Avoid moving your furniture around. If any of your floors are uneven, fix them. If there are any pets around you, be aware of where they are. Review your medicines with your doctor. Some medicines can make you feel dizzy. This can increase your chance of falling. Ask your doctor what other things that you can do to help prevent falls. This information is not intended to replace advice given to you by your health care provider. Make sure you discuss any questions you have with your health care provider. Document Released: 07/15/2009 Document Revised: 02/24/2016 Document Reviewed: 10/23/2014 Elsevier Interactive Patient Education  2017 [employer].

## 2022-06-22 NOTE — Progress Notes (Signed)
Virtual Visit via Telephone Note  I connected with  Brendan Simpson on 06/22/22 at 11:15 AM EDT by telephone and verified that I am speaking with the correct person using two identifiers.  Medicare Annual Wellness visit completed telephonically due to Covid-19 pandemic.   Persons participating in this call: This Health Coach and this patient.   Location: Patient: home Provider: office   I discussed the limitations, risks, security and privacy concerns of performing an evaluation and management service by telephone and the availability of in person appointments. The patient expressed understanding and agreed to proceed.  Unable to perform video visit due to video visit attempted and failed and/or patient does not have video capability.   Some vital signs may be absent or patient reported.   Brendan Schleinina H Oliviagrace Crisanti, LPN   Subjective:   Brendan CheeseDavid L Madariaga is a 71 y.o. male who presents for Medicare Annual/Subsequent preventive examination.  Review of Systems     Cardiac Risk Factors include: advanced age (>1655men, 47>65 women);dyslipidemia;hypertension;male gender     Objective:    There were no vitals filed for this visit. There is no height or weight on file to calculate BMI.     06/22/2022   11:19 AM 05/07/2021    7:07 AM 04/14/2021    8:08 AM 04/12/2020    9:49 AM 03/26/2019    4:33 PM  Advanced Directives  Does Patient Have a Medical Advance Directive? No No No Yes No  Type of Aeronautical engineerAdvance Directive    Healthcare Power of [place];Living will   Does patient want to make changes to medical advance directive?    Yes (MAU/Ambulatory/Procedural Areas - Information given)   Copy of Healthcare Power of Attorney in Chart?    No - copy requested   Would patient like information on creating a medical advance directive? Yes (MAU/Ambulatory/Procedural Areas - Information given) No - Patient declined No - Patient declined      Current Medications (verified) Outpatient Encounter Medications as of  06/22/2022  Medication Sig   amLODipine (NORVASC) 10 MG tablet TAKE 1 TABLET BY MOUTH EVERY DAY   aspirin EC 81 MG tablet Take 81 mg by mouth daily.   azelaic acid (AZELEX) 20 % cream Apply topically as needed. After skin is thoroughly washed and patted dry, gently but thoroughly massage a thin film of azelaic acid cream into the affected area twice daily, in the morning and evening.   diclofenac Sodium (VOLTAREN) 1 % GEL Apply 4 g topically 4 (four) times daily.   EPINEPHrine 0.3 mg/0.3 mL IJ SOAJ injection See admin instructions.   esomeprazole (NEXIUM) 40 MG capsule TAKE 1 CAPSULE BY MOUTH EVERY DAY   fluticasone (FLONASE) 50 MCG/ACT nasal spray Place 1 spray into both nostrils daily. START with 1 spray each nostril TWICE a day for 1 week, then reduce to 1 spray each nostril daily until symptoms are resolved.   levothyroxine (SYNTHROID) 100 MCG tablet Take 1 tablet (100 mcg total) by mouth daily.   rosuvastatin (CRESTOR) 40 MG tablet TAKE 1 TABLET BY MOUTH EVERY DAY   No facility-administered encounter medications on file as of 06/22/2022.    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   Family History  Problem Relation Age of Onset   Breast cancer Mother    Heart disease Father    Diabetes Sister    Diabetes Brother    Colon cancer Neg Hx    Esophageal cancer Neg Hx    Rectal cancer Neg Hx    Stomach cancer Neg Hx    Social History   Socioeconomic History   Marital status: Married    Spouse name: Tommie Bohlken   Number of children: 2   Years of education:  Not on file   Highest education level: Not on file  Occupational History   Occupation: International aid/development worker: [employer]   Occupation: Retired  Tobacco Use   Smoking status: Never   Smokeless tobacco: Never  Substance and Sexual Activity   Alcohol use: Yes    Alcohol/week: 0.0 standard drinks of alcohol    Comment: occasioinal   Drug use: No   Sexual activity: Yes    Partners: Female    Birth control/protection: None  Other Topics Concern   Not on file  Social History Narrative   [employer] 4 days per week. Aerobic and strength training. Christian. Wife is Joaquim Lai, together nearly 53 years. Adult children are supportive. Will and POA in place. Working on Financial controller. 03/24/2019    Social Determinants of Health   Financial Resource Strain: Low Risk  (06/22/2022)   Overall Financial Resource Strain (CARDIA)    Difficulty of Paying Living Expenses: Not hard at all  Food Insecurity: No Food Insecurity (06/22/2022)   Hunger Vital Sign    Worried About Running Out of Food in the Last Year: Never true    Ran Out of Food in the Last Year: Never true  Transportation Needs: No Transportation Needs (06/22/2022)   PRAPARE - Hydrologist (Medical): No    Lack of Transportation (Non-Medical): No  Physical Activity: Sufficiently Active (06/22/2022)   Exercise Vital Sign    Days of Exercise per Week: 4 days    Minutes of Exercise per Session: 90 min  Stress: No Stress Concern Present (06/22/2022)   [place]    Feeling of Stress : Not at all  Social Connections: [place] (06/22/2022)   Social Connection and Isolation Panel [NHANES]    Frequency of Communication with Friends and Family: More than three times a week    Frequency of Social Gatherings with Friends and Family: More than three times a week    Attends Religious Services: More than 4 times per year    Active  Member of [employer] or Organizations: Yes    Attends Archivist Meetings: 1 to 4 times per year    Marital Status: Married    Tobacco Counseling Counseling given: Not Answered   Clinical Intake:  Pre-visit preparation completed: Yes  Pain : No/denies pain     Nutritional Risks: None Diabetes: No  How often do you need to have someone help you when you read instructions, pamphlets, or other written materials from your doctor or pharmacy?: 1 - Never  Diabetic?no  Interpreter Needed?: No  Information entered by :: Charlott Rakes, LPN   Activities of Daily Living    06/22/2022   11:20 AM  In your  present state of health, do you have any difficulty performing the following activities:  Hearing? 1  Comment wears hearing aids  Vision? 0  Difficulty concentrating or making decisions? 0  Walking or climbing stairs? 0  Dressing or bathing? 0  Doing errands, shopping? 0  Preparing Food and eating ? N  Using the Toilet? N  In the past six months, have you accidently leaked urine? N  Do you have problems with loss of bowel control? N  Managing your Medications? N  Managing your Finances? N  Housekeeping or managing your Housekeeping? N    Patient Care Team: Ardith Dark, MD as PCP - General (Family Medicine) Irena Cords, Enzo Montgomery, MD as Consulting Physician (Allergy and Immunology)  Indicate any recent Medical Services you may have received from other than [facility] providers in the past year (date may be approximate).     Assessment:   This is a routine wellness examination for Brendan Simpson.  Hearing/Vision screen Hearing Screening - Comments:: Pt wears hearing aids  Vision Screening - Comments:: [facility] for exams   Dietary issues and exercise activities discussed: Current Exercise Habits: Home exercise routine, Type of exercise: walking;strength training/weights, Time (Minutes): > 60, Frequency (Times/Week): 4, Weekly Exercise (Minutes/Week): 0   Goals  Addressed             This Visit's Progress    Patient Stated       Maintain health and activity       Depression Screen    06/22/2022   11:18 AM 04/07/2022    7:59 AM 04/14/2021    8:06 AM 03/25/2021    9:41 AM 04/12/2020    9:46 AM 03/24/2020    9:44 AM 09/16/2019    3:02 PM  PHQ 2/9 Scores  PHQ - 2 Score 0 0 0 0 0 0 0    Fall Risk    06/22/2022   11:20 AM 04/07/2022    7:59 AM 04/14/2021    8:09 AM 03/25/2021    9:41 AM 01/13/2021   11:21 AM  Fall Risk   Falls in the past year? 0 0 0 0 0  Number falls in past yr: 0 0 0 0   Injury with Fall? 0 0 0 0   Risk for fall due to : No Fall Risks;Impaired vision No Fall Risks Impaired vision    Follow up Falls prevention discussed  Falls prevention discussed      FALL RISK PREVENTION PERTAINING TO THE HOME:  Any stairs in or around the home? No  If so, are there any without handrails? No  Home free of loose throw rugs in walkways, pet beds, electrical cords, etc? Yes  Adequate lighting in your home to reduce risk of falls? Yes   ASSISTIVE DEVICES UTILIZED TO PREVENT FALLS:  Life alert? No  Use of a cane, walker or w/c? No  Grab bars in the bathroom? Yes  Shower chair or bench in shower? Yes  Elevated toilet seat or a handicapped toilet? No   TIMED UP AND GO:  Was the test performed? No .   Cognitive Function:        06/22/2022   11:22 AM 04/14/2021    8:11 AM 04/12/2020    9:55 AM  6CIT Screen  What Year? 0 points 0 points 0 points  What month? 0 points 0 points 0 points  What time? 0 points 0 points 0 points  Count back from 20 0 points 0 points 0  points  Months in reverse 0 points 0 points 0 points  Repeat phrase 2 points 4 points 2 points  Total Score 2 points 4 points 2 points    Immunizations Immunization History  Administered Date(s) Administered   Fluad Quad(high Dose 65+) 06/05/2019   Influenza, High Dose Seasonal PF 07/13/2015, 08/01/2017, 06/21/2018, 07/30/2018, 08/03/2020, 06/15/2021, 08/03/2021    Influenza,inj,Quad PF,6+ Mos 07/03/2015, 06/05/2017, 06/05/2017, 07/19/2017   Influenza-Unspecified 06/05/2016, 06/14/2020   PFIZER Comirnaty(Gray Top)Covid-19 Tri-Sucrose Vaccine 01/24/2021   PFIZER(Purple Top)SARS-COV-2 Vaccination 10/22/2019, 11/12/2019, 07/01/2020   Pfizer Covid-19 Vaccine Bivalent Booster 47yrs & up 06/15/2021   Pfizer Covid-19 Vaccine Bivalent Booster 5y-11y 02/20/2022   Pneumococcal Conjugate-13 09/16/2015   Pneumococcal Polysaccharide-23 03/20/2018, 07/30/2018, 08/03/2020, 08/03/2021   Tdap 09/19/2006, 03/19/2017   Zoster Recombinat (Shingrix) 03/30/2018, 07/08/2018   Zoster, Live 03/05/2013    TDAP status: Up to date  Flu Vaccine status: Due, Education has been provided regarding the importance of this vaccine. Advised may receive this vaccine at local pharmacy or Health Dept. Aware to provide a copy of the vaccination record if obtained from local pharmacy or Health Dept. Verbalized acceptance and understanding.  Pneumococcal vaccine status: Up to date  Covid-19 vaccine status: Completed vaccines  Qualifies for Shingles Vaccine? Yes   Zostavax completed Yes   Shingrix Completed?: Yes  Screening Tests Health Maintenance  Topic Date Due   INFLUENZA VACCINE  05/02/2022   TETANUS/TDAP  03/20/2027   COLONOSCOPY (Pts 45-73yrs Insurance coverage will need to be confirmed)  05/13/2028   Pneumonia Vaccine 53+ Years old  Completed   COVID-19 Vaccine  Completed   Hepatitis C Screening  Completed   Zoster Vaccines- Shingrix  Completed   HPV VACCINES  Aged Out    Health Maintenance  Health Maintenance Due  Topic Date Due   INFLUENZA VACCINE  05/02/2022    Colorectal cancer screening: Type of screening: Colonoscopy. Completed 05/13/18. Repeat every 10 years   Additional Screening:  Hepatitis C Screening:  Completed 03/19/17  Vision Screening: Recommended annual ophthalmology exams for early detection of glaucoma and other disorders of the eye. Is the  patient up to date with their annual eye exam?  Yes  Who is the provider or what is the name of the office in which the patient attends annual eye exams? [facility]  If pt is not established with a provider, would they like to be referred to a provider to establish care? No .   Dental Screening: Recommended annual dental exams for proper oral hygiene  Community Resource Referral / Chronic Care Management: CRR required this visit?  No   CCM required this visit?  No      Plan:     I have personally reviewed and noted the following in the patient's chart:   Medical and social history Use of alcohol, tobacco or illicit drugs  Current medications and supplements including opioid prescriptions. Patient is not currently taking opioid prescriptions. Functional ability and status Nutritional status Physical activity Advanced directives List of other physicians Hospitalizations, surgeries, and ER visits in previous 12 months Vitals Screenings to include cognitive, depression, and falls Referrals and appointments  In addition, I have reviewed and discussed with patient certain preventive protocols, quality metrics, and best practice recommendations. A written personalized care plan for preventive services as well as general preventive health recommendations were provided to patient.     Brendan Schlein, LPN   [id number]   Nurse Notes: none

## 2022-06-26 ENCOUNTER — Encounter: Payer: Self-pay | Admitting: *Deleted

## 2022-06-26 ENCOUNTER — Other Ambulatory Visit (INDEPENDENT_AMBULATORY_CARE_PROVIDER_SITE_OTHER): Payer: Medicare HMO

## 2022-06-26 DIAGNOSIS — E039 Hypothyroidism, unspecified: Secondary | ICD-10-CM

## 2022-06-26 LAB — TSH: TSH: 3.08 u[IU]/mL (ref 0.35–5.50)

## 2022-06-27 NOTE — Progress Notes (Signed)
Please inform patient of the following:  Thyroid levels are back to normal.  He should continue his current dose.  We can recheck again at his next office visit.

## 2022-08-09 ENCOUNTER — Other Ambulatory Visit: Payer: Self-pay | Admitting: *Deleted

## 2022-08-09 DIAGNOSIS — K219 Gastro-esophageal reflux disease without esophagitis: Secondary | ICD-10-CM

## 2022-08-09 MED ORDER — ESOMEPRAZOLE MAGNESIUM 40 MG PO CPDR: DELAYED_RELEASE_CAPSULE | ORAL | 1 refills | Status: DC

## 2022-09-15 ENCOUNTER — Other Ambulatory Visit: Payer: Self-pay | Admitting: *Deleted

## 2022-09-15 DIAGNOSIS — K219 Gastro-esophageal reflux disease without esophagitis: Secondary | ICD-10-CM

## 2022-09-15 MED ORDER — ESOMEPRAZOLE MAGNESIUM 40 MG PO CPDR: DELAYED_RELEASE_CAPSULE | ORAL | 1 refills | Status: DC

## 2022-09-20 ENCOUNTER — Telehealth: Payer: Self-pay | Admitting: Family Medicine

## 2022-09-20 ENCOUNTER — Other Ambulatory Visit: Payer: Self-pay | Admitting: *Deleted

## 2022-09-20 DIAGNOSIS — K219 Gastro-esophageal reflux disease without esophagitis: Secondary | ICD-10-CM

## 2022-09-20 MED ORDER — ESOMEPRAZOLE MAGNESIUM 40 MG PO CPDR: DELAYED_RELEASE_CAPSULE | ORAL | 1 refills | Status: DC

## 2022-09-20 NOTE — Telephone Encounter (Signed)
Pt states esomeprazole (NEXIUM) 40 MG capsule was sent in as 30 days but is supposed to be sent as 90 days for it to be cheaper through insurance. Please call pt back with details.

## 2022-09-20 NOTE — Telephone Encounter (Signed)
Rx was resend with 90 days supply

## 2022-10-18 ENCOUNTER — Ambulatory Visit: Payer: 59 | Admitting: Podiatry

## 2022-11-09 IMAGING — CT CT ABD-PEL WO/W CM
2 of 8 series · 12 of 46 positions shown, 18 images · IV contrast (omnipaque)
Comparison: None.

CLINICAL DATA: Painless gross hematuria.

EXAM:
CT ABDOMEN AND PELVIS WITHOUT AND WITH CONTRAST
TECHNIQUE: Multidetector CT imaging of the abdomen and pelvis was performed
following the standard protocol before and following the bolus
administration of intravenous contrast.
CONTRAST:  125mL OMNIPAQUE IOHEXOL 300 MG/ML  SOLN

[Series 2: axial pre · axial · non-contrast · 0.86mm/px · z∈[-515,-75]mm · 10 of 104 slices shown, 15 images]
[im 8/104  soft-tissue]
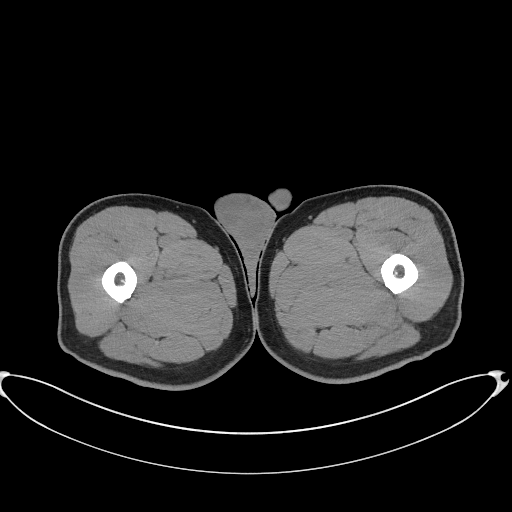
[im 8/104  bone]
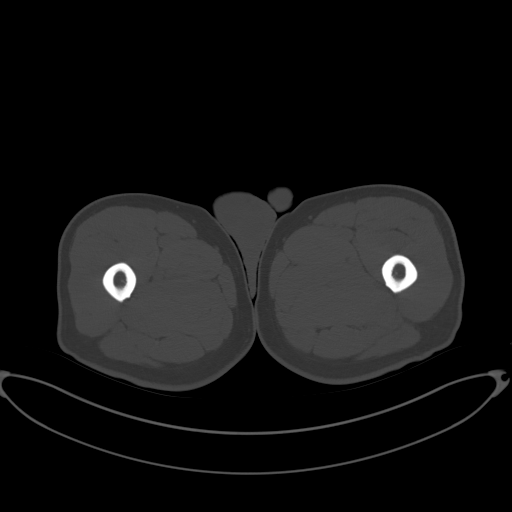
[im 24/104  soft-tissue]
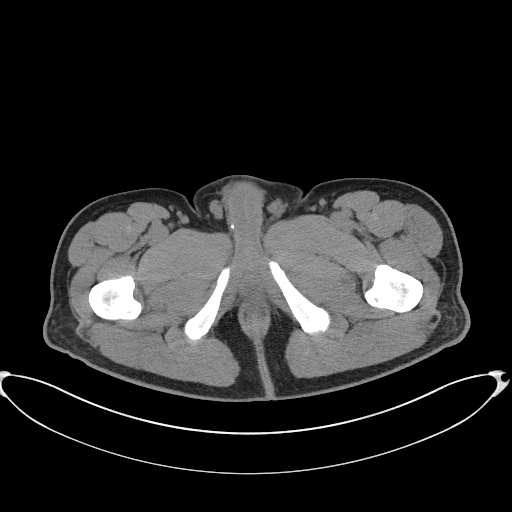
[im 32/104  soft-tissue]
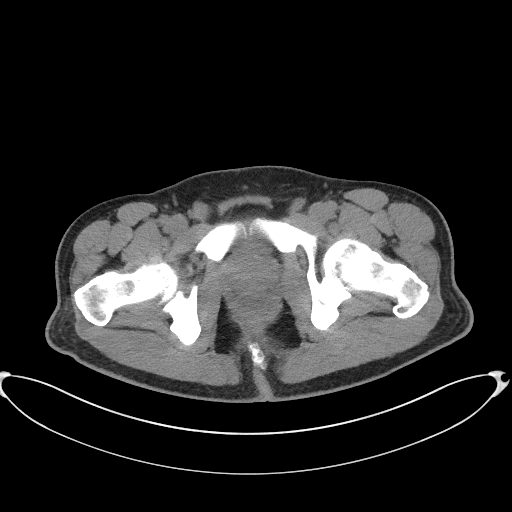
[im 40/104  soft-tissue]
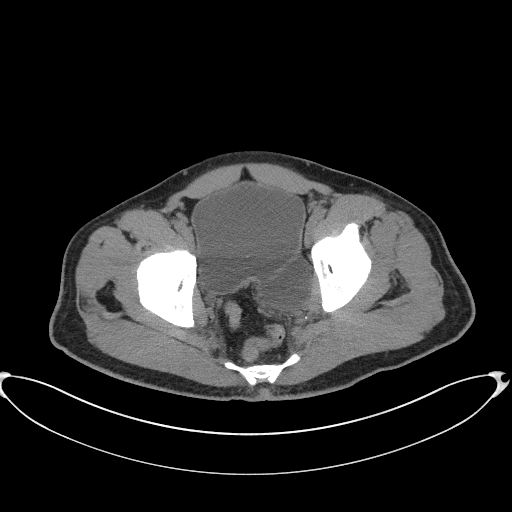
[im 56/104  soft-tissue]
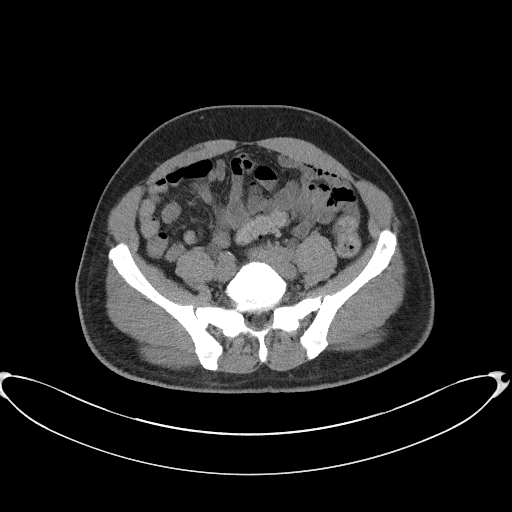
[im 64/104  soft-tissue]
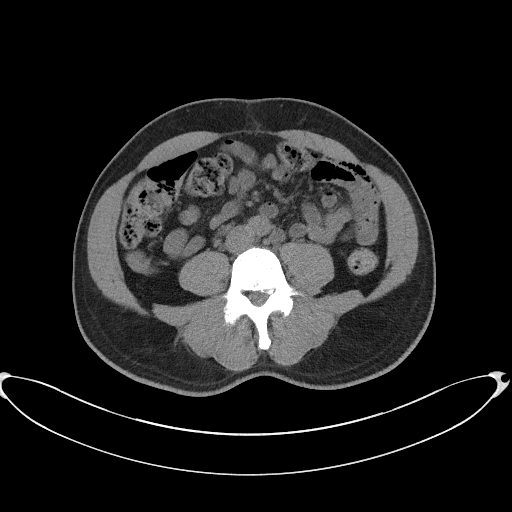
[im 72/104  soft-tissue]
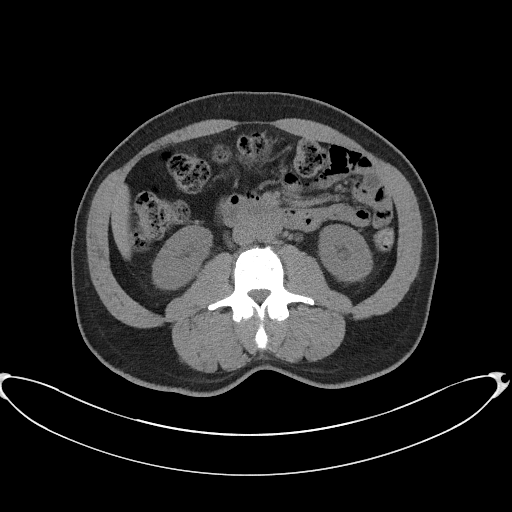
[im 72/104  lung]
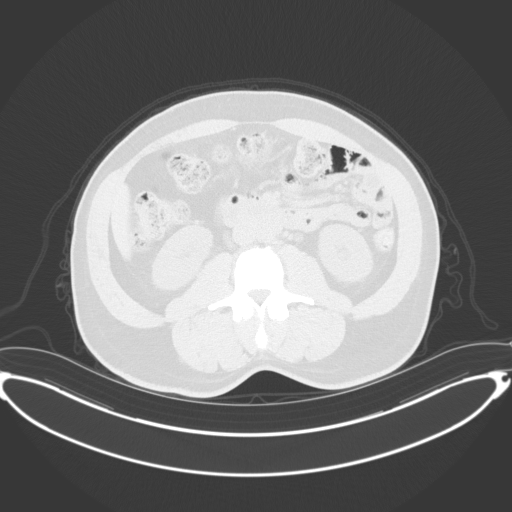
[im 80/104  lung]
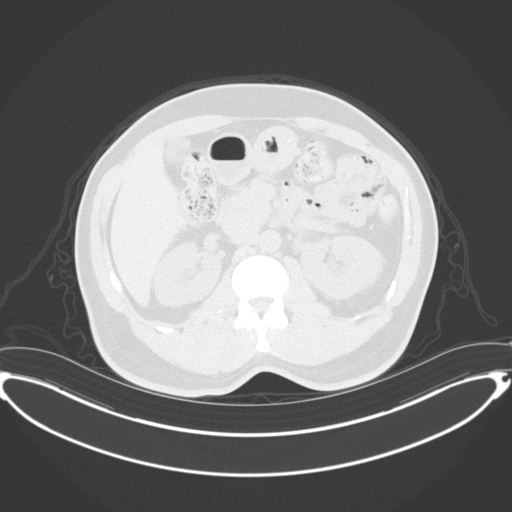
[im 88/104  soft-tissue]
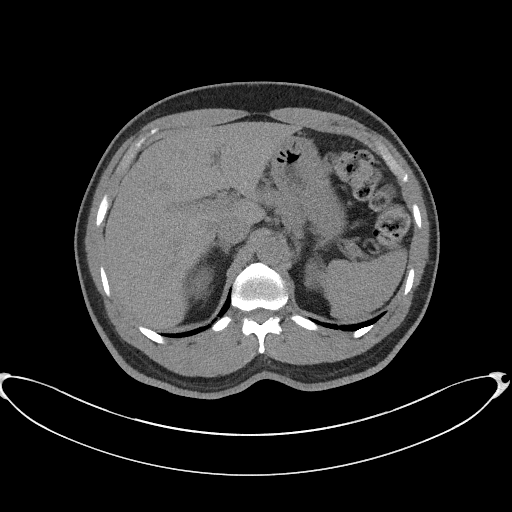
[im 88/104  lung]
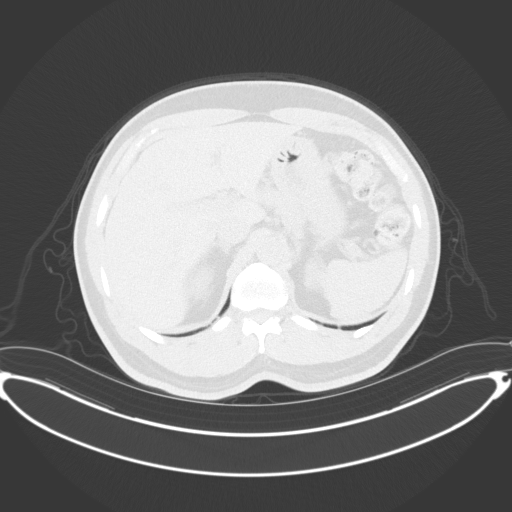
[im 96/104  soft-tissue]
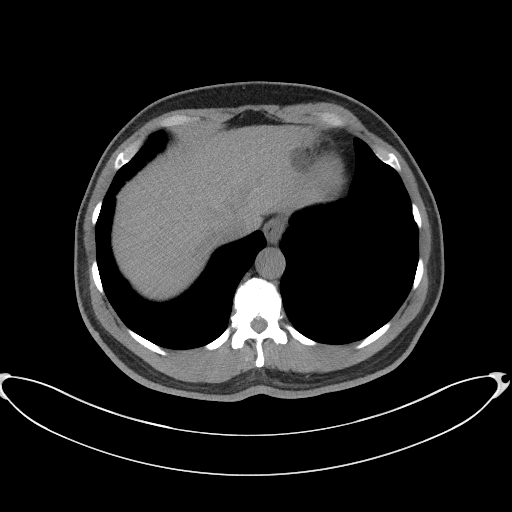
[im 96/104  lung]
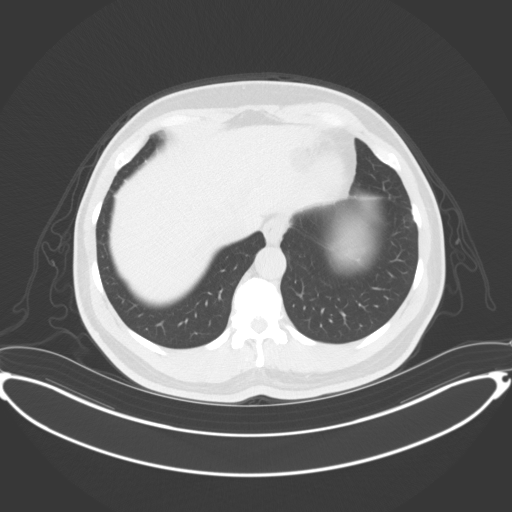
[im 96/104  bone]
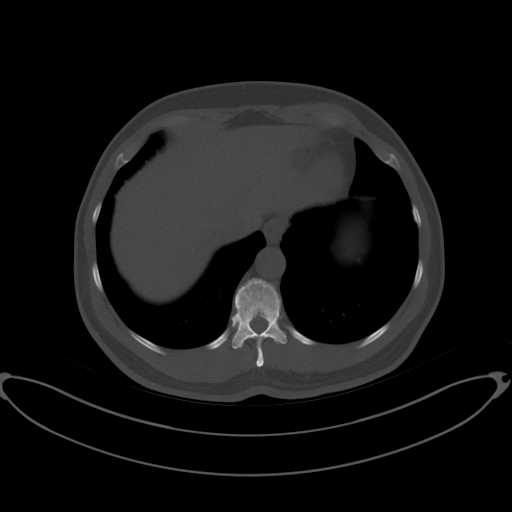

[Series 4: coronal pre · coronal · non-contrast · 0.81mm/px · 2 of 93 slices shown, 3 images]
[im 31/93  soft-tissue]
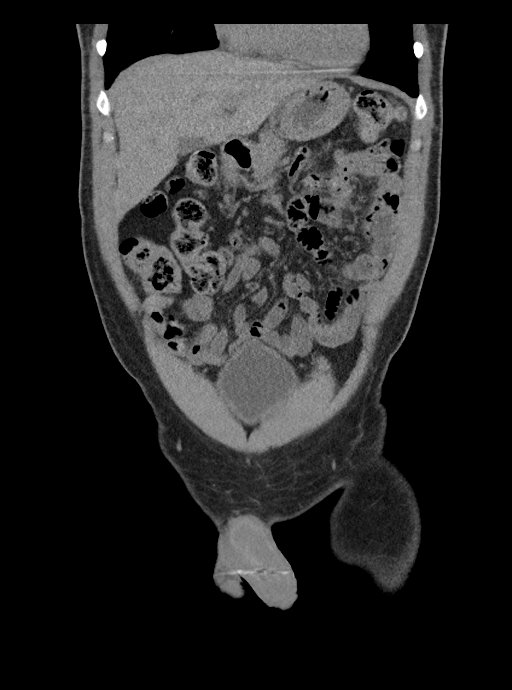
[im 31/93  bone]
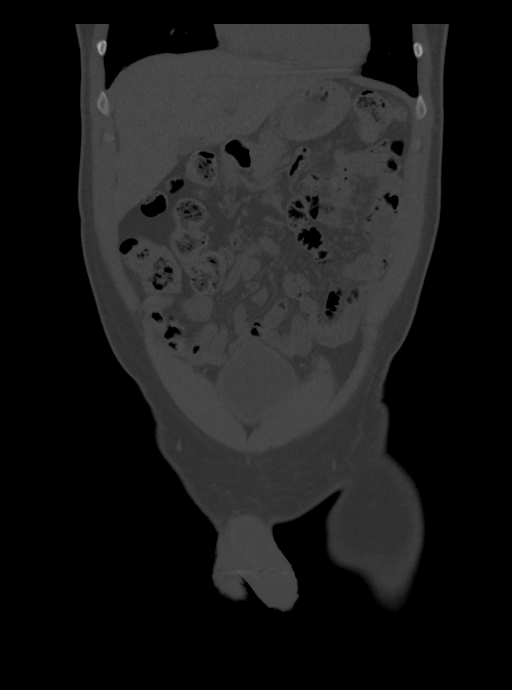
[im 62/93  soft-tissue]
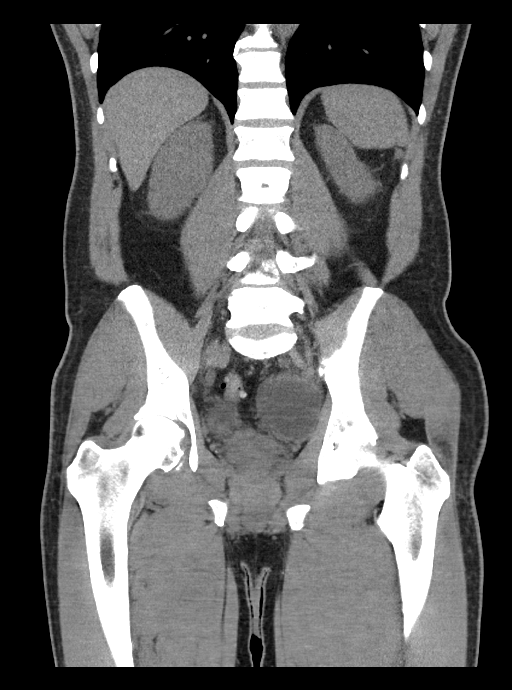

[12 of 46 positions shown; findings below may reference images not displayed]

FINDINGS: Lower Chest: No acute findings.

Hepatobiliary: No hepatic masses identified. 2.3 cm benign-appearing
cyst noted in segment 2 of the left lobe. Gallbladder is
unremarkable. No evidence of biliary ductal dilatation.

Pancreas:  No mass or inflammatory changes.

Spleen: Within normal limits in size and appearance.

Adrenals/Urinary Tract: No adrenal masses identified. No evidence of
urolithiasis or hydronephrosis. No complex cystic or solid renal
masses identified. No masses seen involving the collecting systems,
ureters, or bladder. A large diverticulum measuring 6.6 cm in
maximum diameter is seen arising from the left posterior bladder
wall.

Stomach/Bowel: No evidence of obstruction, inflammatory process or
abnormal fluid collections. Diverticulosis is seen mainly involving
the sigmoid colon, however there is no evidence of diverticulitis.

Vascular/Lymphatic: No pathologically enlarged lymph nodes. No
abdominal aortic aneurysm. Aortic atherosclerotic calcification
noted.

Reproductive: Mildly enlarged prostate gland is seen, with median
lobe hypertrophy indenting the bladder base.

Other: Small paraumbilical ventral hernia is seen containing only
fat.

Musculoskeletal:  No suspicious bone lesions identified.
IMPRESSION: No radiographic evidence of urinary tract neoplasm, urolithiasis, or
hydronephrosis.

Mildly enlarged prostate, with median lobe hypertrophy indenting the
bladder base.

Large left posterior bladder diverticulum.

Colonic diverticulosis, without radiographic evidence of
diverticulitis.

Small paraumbilical ventral hernia, which contains only fat.

Aortic Atherosclerosis (M265L-DRV.V).

## 2022-11-29 ENCOUNTER — Encounter: Payer: Self-pay | Admitting: Family Medicine

## 2022-11-29 ENCOUNTER — Ambulatory Visit (INDEPENDENT_AMBULATORY_CARE_PROVIDER_SITE_OTHER): Payer: Medicare HMO | Admitting: Family Medicine

## 2022-11-29 VITALS — BP 126/79 | HR 69 | Temp 97.7°F | Ht 67.0 in | Wt 180.6 lb

## 2022-11-29 DIAGNOSIS — Z711 Person with feared health complaint in whom no diagnosis is made: Secondary | ICD-10-CM | POA: Diagnosis not present

## 2022-11-29 DIAGNOSIS — L739 Follicular disorder, unspecified: Secondary | ICD-10-CM

## 2022-11-29 DIAGNOSIS — I10 Essential (primary) hypertension: Secondary | ICD-10-CM

## 2022-11-29 MED ORDER — AZELAIC ACID 20 % EX CREA: TOPICAL_CREAM | CUTANEOUS | 2 refills | Status: AC | PRN

## 2022-11-29 NOTE — Progress Notes (Signed)
   Brendan Simpson is a 72 y.o. male who presents today for an office visit.  Assessment/Plan:  New/Acute Problems: Concern for foreign body in ear canal No foreign bodies observed on today's exam.  It is possible with a cotton swab fell out at home.  Discussed avoidance of using Q-tips.  We discussed reasons to return to care.  He continues gentle irrigation to help with earwax buildup if needed.  Chronic Problems Addressed Today: Folliculitis Stable.  Azelaic acid cream refilled today.  Benign essential hypertension Blood pressure well controlled today on amlodipine 10 mg daily.     Subjective:  HPI:  Patient here with fullness in right ear. Was using a qtip to clean in his ear and the cotton swab got stuck.  Has a fullness there.  This seems to be improving over the last day or so.       Objective:  Physical Exam: BP 126/79   Pulse 69   Temp 97.7 F (36.5 C) (Temporal)   Ht 5' 7"$  (1.702 m)   Wt 180 lb 9.6 oz (81.9 kg)   SpO2 98%   BMI 28.29 kg/m   Gen: No acute distress, resting comfortably HEENT: TMs clear bilaterally.  EAC clear. CV: Regular rate and rhythm with no murmurs appreciated Pulm: Normal work of breathing, clear to auscultation bilaterally with no crackles, wheezes, or rhonchi Neuro: Grossly normal, moves all extremities Psych: Normal affect and thought content      Brendan Devita M. Jerline Pain, MD 11/29/2022 10:31 AM

## 2022-11-29 NOTE — Patient Instructions (Signed)
It was very nice to see you today!  Your ear is clear.   I will refill your medications today.  Will see you back soon for your physical.  Come back sooner if needed.  Take care, Dr Jerline Pain  PLEASE NOTE:  If you had any lab tests, please let us know if you have not heard back within a few days. You may see your results on mychart before we have a chance to review them but we will give you a call once they are reviewed by [place].   If we ordered any referrals today, please let us know if you have not heard from their office within the next week.   If you had any urgent prescriptions sent in today, please check with the pharmacy within an hour of our visit to make sure the prescription was transmitted appropriately.   Please try these tips to maintain a healthy lifestyle:  Eat at least 3 REAL meals and 1-2 snacks per day.  Aim for no more than 5 hours between eating.  If you eat breakfast, please do so within one hour of getting up.   Each meal should contain half fruits/vegetables, one quarter protein, and one quarter carbs (no bigger than a computer mouse)  Cut down on sweet beverages. This includes juice, soda, and sweet tea.   Drink at least 1 glass of water with each meal and aim for at least 8 glasses per day  Exercise at least 150 minutes every week.

## 2022-11-29 NOTE — Assessment & Plan Note (Signed)
Stable.  Azelaic acid cream refilled today.

## 2022-11-29 NOTE — Assessment & Plan Note (Signed)
Blood pressure well controlled today on amlodipine 10 mg daily.

## 2022-12-08 ENCOUNTER — Other Ambulatory Visit: Payer: Self-pay | Admitting: Family Medicine

## 2022-12-08 DIAGNOSIS — I10 Essential (primary) hypertension: Secondary | ICD-10-CM

## 2023-03-02 ENCOUNTER — Other Ambulatory Visit: Payer: Self-pay | Admitting: Family Medicine

## 2023-03-02 ENCOUNTER — Telehealth: Payer: Self-pay | Admitting: Family Medicine

## 2023-03-02 DIAGNOSIS — E7800 Pure hypercholesterolemia, unspecified: Secondary | ICD-10-CM

## 2023-03-02 NOTE — Telephone Encounter (Signed)
See note

## 2023-03-02 NOTE — Telephone Encounter (Signed)
Caller states calling on behalf of pt and [facility] on [address].   Caller states Azelaic acid 20 % cream is not on patient's insurance formulary. States an alternative, provided by insurance,that pt is interested is clindamycin phosphate 1% gel. Sending a 3 month supply of the medication would also save pt more money.   Caller stated if pcp agreeable he could send [facility] to [facility] on [address]. For any questions contact [phone].

## 2023-03-05 ENCOUNTER — Other Ambulatory Visit: Payer: Self-pay | Admitting: *Deleted

## 2023-03-05 MED ORDER — CLINDAMYCIN PHOSPHATE 1 % EX GEL: Freq: Two times a day (BID) | CUTANEOUS | 0 refills | Status: DC

## 2023-03-05 NOTE — Telephone Encounter (Signed)
Rx send to pharmacy  

## 2023-03-05 NOTE — Telephone Encounter (Signed)
Ok to send in clindamycin gel.  Katina Degree. [name], MD 03/05/2023 7:28 AM

## 2023-03-21 ENCOUNTER — Other Ambulatory Visit: Payer: Self-pay | Admitting: Family Medicine

## 2023-03-21 ENCOUNTER — Telehealth: Payer: Self-pay | Admitting: Family Medicine

## 2023-03-21 DIAGNOSIS — K219 Gastro-esophageal reflux disease without esophagitis: Secondary | ICD-10-CM

## 2023-03-21 NOTE — Telephone Encounter (Signed)
Pt wants to know if he needs to do his labs before his CPE in July. I did not see any orders. Pt would like a call back with details. Please advise.

## 2023-03-21 NOTE — Telephone Encounter (Signed)
Please advise 

## 2023-03-22 ENCOUNTER — Other Ambulatory Visit: Payer: Self-pay | Admitting: *Deleted

## 2023-03-22 DIAGNOSIS — I10 Essential (primary) hypertension: Secondary | ICD-10-CM

## 2023-03-22 DIAGNOSIS — Z0000 Encounter for general adult medical examination without abnormal findings: Secondary | ICD-10-CM

## 2023-03-22 DIAGNOSIS — E039 Hypothyroidism, unspecified: Secondary | ICD-10-CM

## 2023-03-22 DIAGNOSIS — R7303 Prediabetes: Secondary | ICD-10-CM

## 2023-03-22 DIAGNOSIS — E7800 Pure hypercholesterolemia, unspecified: Secondary | ICD-10-CM

## 2023-03-22 NOTE — Telephone Encounter (Signed)
We can do them the same day. If he prefers to have them done ahead of time, that is ok. Please place future orders if needed.  Katina Degree. [name], MD 03/22/2023 7:21 AM

## 2023-03-22 NOTE — Telephone Encounter (Signed)
Patient preferred to do labs 3 days prior appt  Future lab placed

## 2023-04-06 ENCOUNTER — Other Ambulatory Visit (INDEPENDENT_AMBULATORY_CARE_PROVIDER_SITE_OTHER): Payer: Medicare HMO

## 2023-04-06 DIAGNOSIS — Z0000 Encounter for general adult medical examination without abnormal findings: Secondary | ICD-10-CM | POA: Diagnosis not present

## 2023-04-06 DIAGNOSIS — E039 Hypothyroidism, unspecified: Secondary | ICD-10-CM

## 2023-04-06 DIAGNOSIS — E7800 Pure hypercholesterolemia, unspecified: Secondary | ICD-10-CM

## 2023-04-06 DIAGNOSIS — R7303 Prediabetes: Secondary | ICD-10-CM | POA: Diagnosis not present

## 2023-04-06 DIAGNOSIS — I10 Essential (primary) hypertension: Secondary | ICD-10-CM | POA: Diagnosis not present

## 2023-04-06 LAB — COMPREHENSIVE METABOLIC PANEL
ALT: 25 U/L (ref 0–53)
AST: 22 U/L (ref 0–37)
Albumin: 4.2 g/dL (ref 3.5–5.2)
Alkaline Phosphatase: 53 U/L (ref 39–117)
BUN: 12 mg/dL (ref 6–23)
CO2: 28 mEq/L (ref 19–32)
Calcium: 9.2 mg/dL (ref 8.4–10.5)
Chloride: 103 mEq/L (ref 96–112)
Creatinine, Ser: 1.12 mg/dL (ref 0.40–1.50)
GFR: 66.74 mL/min (ref >60.00)
Glucose, Bld: 115 mg/dL — ABNORMAL HIGH (ref 70–99)
Potassium: 4.1 mEq/L (ref 3.5–5.1)
Sodium: 139 mEq/L (ref 135–145)
Total Bilirubin: 0.4 mg/dL (ref 0.2–1.2)
Total Protein: 6.8 g/dL (ref 6.0–8.3)

## 2023-04-06 LAB — LIPID PANEL
Cholesterol: 143 mg/dL (ref 0–200)
HDL: 53.2 mg/dL (ref >39.00)
LDL Cholesterol: 78 mg/dL (ref 0–99)
NonHDL: 89.74
Total CHOL/HDL Ratio: 3
Triglycerides: 60 mg/dL (ref 0.0–149.0)
VLDL: 12 mg/dL (ref 0.0–40.0)

## 2023-04-06 LAB — CBC
HCT: 42.6 % (ref 39.0–52.0)
Hemoglobin: 14 g/dL (ref 13.0–17.0)
MCHC: 32.7 g/dL (ref 30.0–36.0)
MCV: 89.5 fl (ref 78.0–100.0)
Platelets: 234 10*3/uL (ref 150.0–400.0)
RBC: 4.76 Mil/uL (ref 4.22–5.81)
RDW: 13.5 % (ref 11.5–15.5)
WBC: 6.7 10*3/uL (ref 4.0–10.5)

## 2023-04-06 LAB — HEMOGLOBIN A1C: Hgb A1c MFr Bld: 6.2 % (ref 4.6–6.5)

## 2023-04-06 LAB — TSH: TSH: 2.35 u[IU]/mL (ref 0.35–5.50)

## 2023-04-06 LAB — PSA: PSA: 2.9 ng/mL (ref 0.10–4.00)

## 2023-04-10 ENCOUNTER — Other Ambulatory Visit: Payer: Self-pay | Admitting: Family Medicine

## 2023-04-10 ENCOUNTER — Ambulatory Visit: Payer: Medicare HMO | Admitting: Family Medicine

## 2023-04-10 ENCOUNTER — Encounter: Payer: Self-pay | Admitting: Family Medicine

## 2023-04-10 VITALS — BP 128/85 | HR 90 | Temp 97.5°F | Ht 67.0 in | Wt 175.0 lb

## 2023-04-10 DIAGNOSIS — I10 Essential (primary) hypertension: Secondary | ICD-10-CM | POA: Diagnosis not present

## 2023-04-10 DIAGNOSIS — N401 Enlarged prostate with lower urinary tract symptoms: Secondary | ICD-10-CM

## 2023-04-10 DIAGNOSIS — R7303 Prediabetes: Secondary | ICD-10-CM

## 2023-04-10 DIAGNOSIS — R351 Nocturia: Secondary | ICD-10-CM

## 2023-04-10 DIAGNOSIS — E039 Hypothyroidism, unspecified: Secondary | ICD-10-CM | POA: Diagnosis not present

## 2023-04-10 DIAGNOSIS — E7800 Pure hypercholesterolemia, unspecified: Secondary | ICD-10-CM

## 2023-04-10 DIAGNOSIS — Z0001 Encounter for general adult medical examination with abnormal findings: Secondary | ICD-10-CM | POA: Diagnosis not present

## 2023-04-10 DIAGNOSIS — R040 Epistaxis: Secondary | ICD-10-CM

## 2023-04-10 NOTE — Assessment & Plan Note (Signed)
Has seen ENT for this.  Gets nosebleeds about once per month or so.  ENT has recommended he treat as needed.  He can use packing.  Also discussed Afrin use as needed for bleedings.  Recommended against frequent use and that he should try to only use this once or twice per month if needed.  He can go back to ENT if this continues to be an issue.

## 2023-04-10 NOTE — Assessment & Plan Note (Signed)
Last LDL 78 on Crestor 40 mg daily.

## 2023-04-10 NOTE — Patient Instructions (Signed)
It was very nice to see you today!  Please come back in a few months to recheck your PSA.  You can use Afrin as needed to help with nosebleeds.  Please only use this when you are having a active nosebleed.  Tried to use this only once or twice per month.  Please continue to work on diet and exercise.  Return in about 1 year (around 04/09/2024) for Annual Physical.   Take care, Dr [name]  PLEASE NOTE:  If you had any lab tests, please let us know if you have not heard back within a few days. You may see your results on mychart before we have a chance to review them but we will give you a call once they are reviewed by [place].   If we ordered any referrals today, please let us know if you have not heard from their office within the next week.   If you had any urgent prescriptions sent in today, please check with the pharmacy within an hour of our visit to make sure the prescription was transmitted appropriately.   Please try these tips to maintain a healthy lifestyle:  Eat at least 3 REAL meals and 1-2 snacks per day.  Aim for no more than 5 hours between eating.  If you eat breakfast, please do so within one hour of getting up.   Each meal should contain half fruits/vegetables, one quarter protein, and one quarter carbs (no bigger than a computer mouse)  Cut down on sweet beverages. This includes juice, soda, and sweet tea.   Drink at least 1 glass of water with each meal and aim for at least 8 glasses per day  Exercise at least 150 minutes every week.    Preventive Care 28 Years and Older, Male Preventive care refers to lifestyle choices and visits with your health care provider that can promote health and wellness. Preventive care visits are also called wellness exams. What can I expect for my preventive care visit? Counseling During your preventive care visit, your health care provider may ask about your: Medical history, including: Past medical problems. Family medical  history. History of falls. Current health, including: Emotional well-being. Home life and relationship well-being. Sexual activity. Memory and ability to understand (cognition). Lifestyle, including: Alcohol, nicotine or tobacco, and drug use. Access to firearms. Diet, exercise, and sleep habits. Work and work Astronomer. Sunscreen use. Safety issues such as seatbelt and bike helmet use. Physical exam Your health care provider will check your: Height and weight. These may be used to calculate your BMI (body mass index). BMI is a measurement that tells if you are at a healthy weight. Waist circumference. This measures the distance around your waistline. This measurement also tells if you are at a healthy weight and may help predict your risk of certain diseases, such as type 2 diabetes and high blood pressure. Heart rate and blood pressure. Body temperature. Skin for abnormal spots. What immunizations do I need?  Vaccines are usually given at various ages, according to a schedule. Your health care provider will recommend vaccines for you based on your age, medical history, and lifestyle or other factors, such as travel or where you work. What tests do I need? Screening Your health care provider may recommend screening tests for certain conditions. This may include: Lipid and cholesterol levels. Diabetes screening. This is done by checking your blood sugar (glucose) after you have not eaten for a while (fasting). Hepatitis C test. Hepatitis B test. HIV (human  immunodeficiency virus) test. STI (sexually transmitted infection) testing, if you are at risk. Lung cancer screening. Colorectal cancer screening. Prostate cancer screening. Abdominal aortic aneurysm (AAA) screening. You may need this if you are a current or former smoker. Talk with your health care provider about your test results, treatment options, and if necessary, the need for more tests. Follow these instructions at  home: Eating and drinking  Eat a diet that includes fresh fruits and vegetables, whole grains, lean protein, and low-fat dairy products. Limit your intake of foods with high amounts of sugar, saturated fats, and salt. Take vitamin and mineral supplements as recommended by your health care provider. Do not drink alcohol if your health care provider tells you not to drink. If you drink alcohol: Limit how much you have to 0-2 drinks a day. Know how much alcohol is in your drink. In the [place] one drink equals one 12 oz bottle of beer (355 mL), one 5 oz glass of wine (148 mL), or one 1 oz glass of hard liquor (44 mL). Lifestyle Brush your teeth every morning and night with fluoride toothpaste. Floss one time each day. Exercise for at least 30 minutes 5 or more days each week. Do not use any products that contain nicotine or tobacco. These products include cigarettes, chewing tobacco, and vaping devices, such as e-cigarettes. If you need help quitting, ask your health care provider. Do not use drugs. If you are sexually active, practice safe sex. Use a condom or other form of protection to prevent STIs. Take aspirin only as told by your health care provider. Make sure that you understand how much to take and what form to take. Work with your health care provider to find out whether it is safe and beneficial for you to take aspirin daily. Ask your health care provider if you need to take a cholesterol-lowering medicine (statin). Find healthy ways to manage stress, such as: Meditation, yoga, or listening to music. Journaling. Talking to a trusted person. Spending time with friends and family. Safety Always wear your seat belt while driving or riding in a vehicle. Do not drive: If you have been drinking alcohol. Do not ride with someone who has been drinking. When you are tired or distracted. While texting. If you have been using any mind-altering substances or drugs. Wear a helmet and other  protective equipment during sports activities. If you have firearms in your house, make sure you follow all gun safety procedures. Minimize exposure to UV radiation to reduce your risk of skin cancer. What's next? Visit your health care provider once a year for an annual wellness visit. Ask your health care provider how often you should have your eyes and teeth checked. Stay up to date on all vaccines. This information is not intended to replace advice given to you by your health care provider. Make sure you discuss any questions you have with your health care provider. Document Revised: 03/16/2021 Document Reviewed: 03/16/2021 Elsevier Patient Education  2024 [employer].

## 2023-04-10 NOTE — Assessment & Plan Note (Signed)
Following with urology.  Last PSA was 2.9.  He is concerned that this was a stark increase from most recent PSA.  We discussed that PSA numbers may fluctuate and that several factors may cause a transient elevation.  He will come back in a few months and we can recheck PSA at that time.  He will continue finasteride and Flomax per urology.

## 2023-04-10 NOTE — Assessment & Plan Note (Signed)
A1c 6.2.  Continue lifestyle modifications.

## 2023-04-10 NOTE — Progress Notes (Signed)
Chief Complaint:  Brendan Simpson is a 72 y.o. male who presents today for his annual comprehensive physical exam.    Assessment/Plan:  Chronic Problems Addressed Today: Epistaxis Has seen ENT for this.  Gets nosebleeds about once per month or so.  ENT has recommended he treat as needed.  He can use packing.  Also discussed Afrin use as needed for bleedings.  Recommended against frequent use and that he should try to only use this once or twice per month if needed.  He can go back to ENT if this continues to be an issue.  BPH (benign prostatic hyperplasia) Following with urology.  Last PSA was 2.9.  He is concerned that this was a stark increase from most recent PSA.  We discussed that PSA numbers may fluctuate and that several factors may cause a transient elevation.  He will come back in a few months and we can recheck PSA at that time.  He will continue finasteride and Flomax per urology.  Hypothyroidism Last TSH at goal on Synthroid 88 mcg daily.  Benign essential hypertension Blood pressure at goal today on amlodipine 10 mg daily.  Hyperlipidemia Last LDL 78 on Crestor 40 mg daily.  Prediabetes A1c 6.2.  Continue lifestyle modifications.  Preventative Healthcare: Up-to-date on vaccines.  Had labs done recently.  UpToDate on cancer screenings.   Patient Counseling(The following topics were reviewed and/or handout was given):  -Nutrition: Stressed importance of moderation in sodium/caffeine intake, saturated fat and cholesterol, caloric balance, sufficient intake of fresh fruits, vegetables, and fiber.  -Stressed the importance of regular exercise.   -Substance Abuse: Discussed cessation/primary prevention of tobacco, alcohol, or other drug use; driving or other dangerous activities under the influence; availability of treatment for abuse.   -Injury prevention: Discussed safety belts, safety helmets, smoke detector, smoking near bedding or upholstery.   -Sexuality: Discussed  sexually transmitted diseases, partner selection, use of condoms, avoidance of unintended pregnancy and contraceptive alternatives.   -Dental health: Discussed importance of regular tooth brushing, flossing, and dental visits.  -Health maintenance and immunizations reviewed. Please refer to Health maintenance section.  Return to care in 1 year for next preventative visit.     Subjective:  HPI:  He has no acute complaints today.  He is here today for annual follow-up.  Last seen about a year ago.  Since our last visit he has been following with urology for BPH and hematuria.  Recently had workup including cystoscopy and CT scan which is reassuring without any signs of cancer.  His urologist has started him on finasteride and tamsulosin for BPH.  He so far has been tolerating this well.  He has also had some issues with recurrent epistaxis.  He saw ENT for this recently.  They told him he had thin blood vessels and that he may have an occasional nosebleed.  No cauterization was performed at that point.  He does have a nosebleed about once per month or so.  Usually controlled at home pressure.  Lifestyle Diet: Balanced. Trying to get more fruits and vegetables.  Exercise: Goes to [facility].      11/29/2022    9:48 AM  Depression screen PHQ 2/9  Decreased Interest 0  Down, Depressed, Hopeless 0  PHQ - 2 Score 0    There are no preventive care reminders to display for this patient.   ROS: Per HPI, otherwise a complete review of systems was negative.   PMH:  The following were reviewed and entered/updated  in epic: Past Medical History:  Diagnosis Date   Arthritis    Cataract    GERD (gastroesophageal reflux disease)    Gout    Hyperlipidemia    Hypertension    Hypothyroidism    Seasonal allergies    Uses hearing aid    Patient Active Problem List   Diagnosis Date Noted   Epistaxis 04/10/2023   BPH (benign prostatic hyperplasia) 03/24/2020   Folliculitis 03/24/2020    Seasonal allergic rhinitis due to pollen 12/20/2018   Prediabetes 06/21/2018   Chronic left-sided low back pain with left-sided sciatica 03/20/2018   Arthritis 02/03/2016   Esophageal reflux 02/03/2016   Gout 02/03/2016   Benign essential hypertension 02/03/2016   Hearing reduced, wears hearing aids bilaterally 02/03/2016   Hypothyroidism 02/03/2016   Hyperlipidemia 02/03/2016   Past Surgical History:  Procedure Laterality Date   CARPAL TUNNEL RELEASE Bilateral    CATARACT EXTRACTION, BILATERAL Bilateral    KNEE ARTHROSCOPY Left    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     azelaic acid (AZELEX) 20 % cream Apply topically as needed for up to 90 doses. After skin is thoroughly washed and patted dry, gently but thoroughly massage a thin film of azelaic acid cream into the affected area twice daily, in the morning and evening. 50 g 2   EPINEPHrine 0.3 mg/0.3 mL IJ SOAJ injection See admin instructions.     esomeprazole (NEXIUM) 40 MG capsule TAKE 1 CAPSULE BY MOUTH EVERY DAY 90 capsule 1   finasteride (PROSCAR) 5 MG tablet      levothyroxine (SYNTHROID) 100 MCG tablet TAKE 1 TABLET BY MOUTH EVERY DAY 90 tablet 3   LYCOPENE PO Take 1 tablet by mouth daily.     rosuvastatin (CRESTOR) 40 MG tablet TAKE 1 TABLET BY MOUTH EVERY DAY 90 tablet 3   tamsulosin (FLOMAX) 0.4 MG CAPS capsule      No current facility-administered medications for this visit.    Allergies-reviewed and updated Allergies  Allergen  Reactions   Atorvastatin Other (See Comments)    Statin-myopathy    Social History   Socioeconomic History   Marital status: Married    Spouse name: Mary Hockey   Number of children: 2   Years of education: Not on file   Highest education level: Not on file  Occupational History   Occupation: Facilities manager: [employer]   Occupation: Retired  Tobacco Use   Smoking status: Never   Smokeless tobacco: Never  Substance and Sexual Activity   Alcohol use: Yes    Alcohol/week: 0.0 standard drinks of alcohol    Comment: occasioinal   Drug use: No   Sexual activity: Yes    Partners: Female    Birth control/protection: None  Other Topics Concern   Not on file  Social History Narrative   [employer] 4 days per week.  Aerobic and strength training. Christian. Wife is Scarlette Calico, together nearly 50 years. Adult children are supportive. Will and POA in place. 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    Social Determinants of Health   Financial Resource Strain: Low Risk  (06/22/2022)   Overall Financial Resource Strain (CARDIA)    Difficulty of Paying Living Expenses: Not hard at all  Food Insecurity: No Food Insecurity (06/22/2022)   Hunger Vital Sign    Worried About Running Out of Food in the Last Year: Never true    Ran Out of Food in the Last Year: Never true  Transportation Needs: No Transportation Needs (06/22/2022)   PRAPARE - Administrator, Civil Service (Medical): No    Lack of Transportation (Non-Medical): No  Physical Activity: Sufficiently Active (06/22/2022)   Exercise Vital Sign    Days of Exercise per Week: 4 days    Minutes of Exercise per Session: 90 min  Stress: No Stress Concern Present (06/22/2022)   [employer] of Occupational Health - Occupational Stress Questionnaire    Feeling of Stress : Not at all  Social Connections: Socially Integrated (06/22/2022)   Social Connection and Isolation Panel [NHANES]    Frequency of  Communication with Friends and Family: More than three times a week    Frequency of Social Gatherings with Friends and Family: More than three times a week    Attends Religious Services: More than 4 times per year    Active Member of [employer] or Organizations: Yes    Attends Banker Meetings: 1 to 4 times per year    Marital Status: Married        Objective:  Physical Exam: BP 128/85   Pulse 90   Temp (!) 97.5 F (36.4 C) (Temporal)   Ht 5\' 7"  (1.702 m)   Wt 175 lb (79.4 kg)   SpO2 98%   BMI 27.41 kg/m   Body mass index is 27.41 kg/m. Wt Readings from Last 3 Encounters:  04/10/23 175 lb (79.4 kg)  11/29/22 180 lb 9.6 oz (81.9 kg)  04/07/22 174 lb 9.6 oz (79.2 kg)   Gen: NAD, resting comfortably HEENT: TMs normal bilaterally. OP clear. No thyromegaly noted.  Small amount of blood in left nares noted. CV: RRR with no murmurs appreciated Pulm: NWOB, CTAB with no crackles, wheezes, or rhonchi GI: Normal bowel sounds present. Soft, Nontender, Nondistended. MSK: no edema, cyanosis, or clubbing noted Skin: warm, dry Neuro: CN2-12 grossly intact. Strength 5/5 in upper and lower extremities. Reflexes symmetric and intact bilaterally.  Psych: Normal affect and thought content     Edith Lord M. [name], MD 04/10/2023 10:35 AM

## 2023-04-10 NOTE — Assessment & Plan Note (Signed)
Last TSH at goal on Synthroid 88 mcg daily.

## 2023-04-10 NOTE — Assessment & Plan Note (Signed)
Blood pressure at goal today on amlodipine 10 mg daily. 

## 2023-05-08 ENCOUNTER — Other Ambulatory Visit: Payer: Self-pay | Admitting: Family Medicine

## 2023-05-17 ENCOUNTER — Encounter (INDEPENDENT_AMBULATORY_CARE_PROVIDER_SITE_OTHER): Payer: Self-pay

## 2023-06-28 ENCOUNTER — Ambulatory Visit: Payer: Medicare HMO

## 2023-06-28 VITALS — Wt 178.0 lb

## 2023-06-28 DIAGNOSIS — Z0000 Encounter for general adult medical examination without abnormal findings: Secondary | ICD-10-CM | POA: Diagnosis not present

## 2023-06-28 NOTE — Progress Notes (Signed)
Subjective:   Brendan Simpson is a 72 y.o. male who presents for Medicare Annual/Subsequent preventive examination.  Visit Complete: Virtual  I connected with  Brendan Simpson on 06/28/23 by a audio enabled telemedicine application and verified that I am speaking with the correct person using two identifiers.  Patient Location: Home  Provider Location: Home Office  I discussed the limitations of evaluation and management by telemedicine. The patient expressed understanding and agreed to proceed.  Patient Medicare AWV questionnaire was completed by the patient on 06/24/23; I have confirmed that all information answered by patient is correct and no changes since this date.  Because this visit was a virtual/telehealth visit, some criteria may be missing or patient reported. Any vitals not documented were not able to be obtained and vitals that have been documented are patient reported.    Cardiac Risk Factors include: advanced age (>56men, >66 women);dyslipidemia;hypertension;male gender     Objective:    Today's Vitals   06/28/23 1121  Weight: 178 lb (80.7 kg)   Body mass index is 27.88 kg/m.     06/28/2023   11:24 AM 06/22/2022   11:19 AM 05/07/2021    7:07 AM 04/14/2021    8:08 AM 04/12/2020    9:49 AM 03/26/2019    4:33 PM  Advanced Directives  Does Patient Have a Medical Advance Directive? No No No No Yes No  Type of Agricultural consultant;Living will   Does patient want to make changes to medical advance directive?     Yes (MAU/Ambulatory/Procedural Areas - Information given)   Copy of Healthcare Power of Attorney in Chart?     No - copy requested   Would patient like information on creating a medical advance directive? No - Patient declined Yes (MAU/Ambulatory/Procedural Areas - Information given) No - Patient declined No - Patient declined      Current Medications (verified) Outpatient Encounter Medications as of 06/28/2023  Medication Sig    amLODipine (NORVASC) 10 MG tablet TAKE 1 TABLET BY MOUTH EVERY DAY   aspirin EC 81 MG tablet Take 81 mg by mouth daily.   azelaic acid (AZELEX) 20 % cream Apply topically as needed for up to 90 doses. After skin is thoroughly washed and patted dry, gently but thoroughly massage a thin film of azelaic acid cream into the affected area twice daily, in the morning and evening.   esomeprazole (NEXIUM) 40 MG capsule TAKE 1 CAPSULE BY MOUTH EVERY DAY   finasteride (PROSCAR) 5 MG tablet    levothyroxine (SYNTHROID) 100 MCG tablet TAKE 1 TABLET BY MOUTH EVERY DAY   LYCOPENE PO Take 1 tablet by mouth daily.   rosuvastatin (CRESTOR) 40 MG tablet TAKE 1 TABLET BY MOUTH EVERY DAY   tamsulosin (FLOMAX) 0.4 MG CAPS capsule    EPINEPHrine 0.3 mg/0.3 mL IJ SOAJ injection See admin instructions. (Patient not taking: Reported on 06/28/2023)   No facility-administered encounter medications on file as of 06/28/2023.    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   Family History  Problem Relation Age of Onset   Breast cancer Mother    Heart disease Father    Diabetes Sister    Diabetes Brother    Colon cancer Neg Hx    Esophageal cancer Neg Hx    Rectal cancer Neg Hx    Stomach cancer Neg Hx    Social History   Socioeconomic History   Marital status: Married    Spouse name: Bracey Heninger   Number of children: 2   Years of education: Not on file   Highest education level: Not on file  Occupational History   Occupation: Facilities manager: [employer]    Occupation: Retired  Tobacco Use   Smoking status: Never   Smokeless tobacco: Never  Substance and Sexual Activity   Alcohol use: Yes    Alcohol/week: 0.0 standard drinks of alcohol    Comment: occasioinal   Drug use: No   Sexual activity: Yes    Partners: Female    Birth control/protection: None  Other Topics Concern   Not on file  Social History Narrative   [employer] 4 days per week. Aerobic and strength training. Christian. Wife is Scarlette Calico, together nearly 50 years. Adult children are supportive. Will and POA in place. 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    Social Determinants of Health   Financial Resource Strain: Low Risk  (06/24/2023)   Overall Financial Resource Strain (CARDIA)    Difficulty of Paying Living Expenses: Not hard at all  Food Insecurity: No Food Insecurity (06/24/2023)   Hunger Vital Sign    Worried About Running Out of Food in the Last Year: Never true    Ran Out of Food in the Last Year: Never true  Transportation Needs: No Transportation Needs (06/24/2023)   PRAPARE - Administrator, Civil Service (Medical): No    Lack of Transportation (Non-Medical): No  Physical Activity: Sufficiently Active (06/24/2023)   Exercise Vital Sign    Days of Exercise per Week: 5 days    Minutes of Exercise per Session: 70 min  Stress: No Stress Concern Present (06/24/2023)   [employer] of Occupational Health - Occupational Stress Questionnaire    Feeling of Stress : Not at all  Social Connections: Socially Integrated (06/24/2023)   Social Connection and Isolation Panel [NHANES]    Frequency of Communication with Friends and Family: Twice a week    Frequency of Social Gatherings with Friends and Family: Once a week    Attends Religious Services: More than 4 times per year    Active Member of [employer] or Organizations: Yes    Attends Engineer, structural: More than 4 times per year    Marital Status: Married    Tobacco Counseling Counseling given:  Not Answered   Clinical Intake:  Pre-visit preparation completed: Yes  Pain : No/denies pain     BMI - recorded: 27.88 Nutritional Status: BMI 25 -29 Overweight Nutritional Risks: None Diabetes: No  How often do you need to have someone help you when you read instructions, pamphlets, or other written materials from your doctor or pharmacy?: 1 - Never  Interpreter Needed?: No  Information entered by :: Lanier Ensign, LPN   Activities of Daily Living    06/24/2023    2:11 PM  In your present state of health, do you have any difficulty performing the following activities:  Hearing? 0  Vision? 0  Difficulty concentrating  or making decisions? 0  Walking or climbing stairs? 0  Dressing or bathing? 0  Doing errands, shopping? 0  Preparing Food and eating ? N  Using the Toilet? N  In the past six months, have you accidently leaked urine? N  Do you have problems with loss of bowel control? N  Managing your Medications? N  Managing your Finances? N  Housekeeping or managing your Housekeeping? N    Patient Care Team: Ardith Dark, MD as PCP - General (Family Medicine) Irena Cords, Enzo Montgomery, MD as Consulting Physician (Allergy and Immunology)  Indicate any recent Medical Services you may have received from other than [facility] providers in the past year (date may be approximate).     Assessment:   This is a routine wellness examination for Shields.  Hearing/Vision screen Hearing Screening - Comments:: Pt denies any hearing issues  Vision Screening - Comments:: Pt follows up with [facility] for annual eye exams    Goals Addressed             This Visit's Progress    Patient Stated       To get down to 170 lbs        Depression Screen    06/28/2023   11:24 AM 11/29/2022    9:48 AM 06/22/2022   11:18 AM 04/07/2022    7:59 AM 04/14/2021    8:06 AM 03/25/2021    9:41 AM 04/12/2020    9:46 AM  PHQ 2/9 Scores  PHQ - 2 Score 0 0 0 0 0 0 0    Fall Risk     06/24/2023    2:11 PM 11/29/2022    9:48 AM 06/22/2022   11:20 AM 04/07/2022    7:59 AM 04/14/2021    8:09 AM  Fall Risk   Falls in the past year? 0 0 0 0 0  Number falls in past yr: 0 0 0 0 0  Injury with Fall? 0 0 0 0 0  Risk for fall due to : No Fall Risks No Fall Risks No Fall Risks;Impaired vision No Fall Risks Impaired vision  Follow up Falls prevention discussed  Falls prevention discussed  Falls prevention discussed    MEDICARE RISK AT HOME: Medicare Risk at Home Any stairs in or around the home?: No If so, are there any without handrails?: No Home free of loose throw rugs in walkways, pet beds, electrical cords, etc?: No Adequate lighting in your home to reduce risk of falls?: Yes Life alert?: No Use of a cane, walker or w/c?: No Grab bars in the bathroom?: Yes Shower chair or bench in shower?: Yes Elevated toilet seat or a handicapped toilet?: No  TIMED UP AND GO:  Was the test performed?  No    Cognitive Function:        06/22/2022   11:22 AM 04/14/2021    8:11 AM 04/12/2020    9:55 AM  6CIT Screen  What Year? 0 points 0 points 0 points  What month? 0 points 0 points 0 points  What time? 0 points 0 points 0 points  Count back from 20 0 points 0 points 0 points  Months in reverse 0 points 0 points 0 points  Repeat phrase 2 points 4 points 2 points  Total Score 2 points 4 points 2 points    Immunizations Immunization History  Administered Date(s) Administered   Fluad Quad(high Dose 65+) 06/05/2019, 08/03/2022   Influenza Split 06/05/2017   Influenza, High Dose  Seasonal PF 07/13/2015, 08/01/2017, 06/21/2018, 07/30/2018, 08/03/2020, 06/15/2021, 08/03/2021, 06/25/2023   Influenza,inj,Quad PF,6+ Mos 07/03/2015, 06/05/2017, 06/05/2017, 07/19/2017   Influenza-Unspecified 06/05/2016, 06/14/2020   PFIZER Comirnaty(Gray Top)Covid-19 Tri-Sucrose Vaccine 01/24/2021   PFIZER(Purple Top)SARS-COV-2 Vaccination 10/22/2019, 11/12/2019, 07/01/2020   Pfizer Covid-19 Vaccine  Bivalent Booster 14yrs & up 06/15/2021   Pfizer Covid-19 Vaccine Bivalent Booster 5y-11y 02/20/2022   Pfizer(Comirnaty)Fall Seasonal Vaccine 12 years and older 08/03/2022   Pneumococcal Conjugate-13 09/16/2015   Pneumococcal Polysaccharide-23 03/20/2018, 07/30/2018, 08/03/2020, 08/03/2021   Rsv, Bivalent, Protein Subunit Rsvpref,pf Verdis Frederickson) 06/25/2023   Tdap 09/19/2006, 03/19/2017   Zoster Recombinant(Shingrix) 03/30/2018, 07/08/2018   Zoster, Live 03/05/2013    TDAP status: Due, Education has been provided regarding the importance of this vaccine. Advised may receive this vaccine at local pharmacy or Health Dept. Aware to provide a copy of the vaccination record if obtained from local pharmacy or Health Dept. Verbalized acceptance and understanding.  Flu Vaccine status: Up to date  Pneumococcal vaccine status: Up to date  Covid-19 vaccine status: Information provided on how to obtain vaccines.   Qualifies for Shingles Vaccine? Yes   Zostavax completed Yes   Shingrix Completed?: Yes  Screening Tests Health Maintenance  Topic Date Due   COVID-19 Vaccine (8 - 2023-24 season) 06/03/2023   Medicare Annual Wellness (AWV)  06/27/2024   DTaP/Tdap/Td (3 - Td or Tdap) 03/20/2027   Colonoscopy  05/13/2028   Pneumonia Vaccine 42+ Years old  Completed   INFLUENZA VACCINE  Completed   Hepatitis C Screening  Completed   Zoster Vaccines- Shingrix  Completed   HPV VACCINES  Aged Out    Health Maintenance  Health Maintenance Due  Topic Date Due   COVID-19 Vaccine (8 - 2023-24 season) 06/03/2023    Colorectal cancer screening: Type of screening: Colonoscopy. Completed 05/13/18. Repeat every 10 years   Additional Screening:  Hepatitis C Screening:  Completed 03/19/17  Vision Screening: Recommended annual ophthalmology exams for early detection of glaucoma and other disorders of the eye. Is the patient up to date with their annual eye exam?  Yes  Who is the provider or what is the  name of the office in which the patient attends annual eye exams? [facility]  If pt is not established with a provider, would they like to be referred to a provider to establish care? No .   Dental Screening: Recommended annual dental exams for proper oral hygiene   Community Resource Referral / Chronic Care Management: CRR required this visit?  No   CCM required this visit?  No     Plan:     I have personally reviewed and noted the following in the patient's chart:   Medical and social history Use of alcohol, tobacco or illicit drugs  Current medications and supplements including opioid prescriptions. Patient is not currently taking opioid prescriptions. Functional ability and status Nutritional status Physical activity Advanced directives List of other physicians Hospitalizations, surgeries, and ER visits in previous 12 months Vitals Screenings to include cognitive, depression, and falls Referrals and appointments  In addition, I have reviewed and discussed with patient certain preventive protocols, quality metrics, and best practice recommendations. A written personalized care plan for preventive services as well as general preventive health recommendations were provided to patient.     Marzella Schlein, LPN   [id number]/61/0960   After Visit Summary: (MyChart) Due to this being a telephonic visit, the after visit summary with patients personalized plan was offered to patient via MyChart   Nurse Notes:  none

## 2023-07-02 NOTE — Progress Notes (Signed)
Subjective:   Brendan Simpson is a 72 y.o. male who presents for Medicare Annual/Subsequent preventive examination.  Visit Complete: Virtual  I connected with  Brendan Simpson on 07/02/23 by a audio enabled telemedicine application and verified that I am speaking with the correct person using two identifiers.  Patient Location: Home  Provider Location: Home Office  I discussed the limitations of evaluation and management by telemedicine. The patient expressed understanding and agreed to proceed.  Patient Medicare AWV questionnaire was completed by the patient on 06/24/23; I have confirmed that all information answered by patient is correct and no changes since this date.  Because this visit was a virtual/telehealth visit, some criteria may be missing or patient reported. Any vitals not documented were not able to be obtained and vitals that have been documented are patient reported.    Cardiac Risk Factors include: advanced age (>70men, >36 women);dyslipidemia;hypertension;male gender     Objective:    Today's Vitals   06/28/23 1121  Weight: 178 lb (80.7 kg)   Body mass index is 27.88 kg/m.     06/28/2023   11:24 AM 06/22/2022   11:19 AM 05/07/2021    7:07 AM 04/14/2021    8:08 AM 04/12/2020    9:49 AM 03/26/2019    4:33 PM  Advanced Directives  Does Patient Have a Medical Advance Directive? No No No No Yes No  Type of Agricultural consultant;Living will   Does patient want to make changes to medical advance directive?     Yes (MAU/Ambulatory/Procedural Areas - Information given)   Copy of Healthcare Power of Attorney in Chart?     No - copy requested   Would patient like information on creating a medical advance directive? No - Patient declined Yes (MAU/Ambulatory/Procedural Areas - Information given) No - Patient declined No - Patient declined      Current Medications (verified) Outpatient Encounter Medications as of 06/28/2023  Medication Sig    amLODipine (NORVASC) 10 MG tablet TAKE 1 TABLET BY MOUTH EVERY DAY   aspirin EC 81 MG tablet Take 81 mg by mouth daily.   azelaic acid (AZELEX) 20 % cream Apply topically as needed for up to 90 doses. After skin is thoroughly washed and patted dry, gently but thoroughly massage a thin film of azelaic acid cream into the affected area twice daily, in the morning and evening.   esomeprazole (NEXIUM) 40 MG capsule TAKE 1 CAPSULE BY MOUTH EVERY DAY   finasteride (PROSCAR) 5 MG tablet    levothyroxine (SYNTHROID) 100 MCG tablet TAKE 1 TABLET BY MOUTH EVERY DAY   LYCOPENE PO Take 1 tablet by mouth daily.   rosuvastatin (CRESTOR) 40 MG tablet TAKE 1 TABLET BY MOUTH EVERY DAY   tamsulosin (FLOMAX) 0.4 MG CAPS capsule    EPINEPHrine 0.3 mg/0.3 mL IJ SOAJ injection See admin instructions. (Patient not taking: Reported on 06/28/2023)   No facility-administered encounter medications on file as of 06/28/2023.    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   Family History  Problem Relation Age of Onset   Breast cancer Mother    Heart disease Father    Diabetes Sister    Diabetes Brother    Colon cancer Neg Hx    Esophageal cancer Neg Hx    Rectal cancer Neg Hx    Stomach cancer Neg Hx    Social History   Socioeconomic History   Marital status: Married    Spouse name: Deundra Bard   Number of children: 2   Years of education: Not on file   Highest education level: Not on file  Occupational History   Occupation: Facilities manager: [employer]    Occupation: Retired  Tobacco Use   Smoking status: Never   Smokeless tobacco: Never  Substance and Sexual Activity   Alcohol use: Yes    Alcohol/week: 0.0 standard drinks of alcohol    Comment: occasioinal   Drug use: No   Sexual activity: Yes    Partners: Female    Birth control/protection: None  Other Topics Concern   Not on file  Social History Narrative   [employer] 4 days per week. Aerobic and strength training. Christian. Wife is Scarlette Calico, together nearly 50 years. Adult children are supportive. Will and POA in place. 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    Social Determinants of Health   Financial Resource Strain: Low Risk  (06/24/2023)   Overall Financial Resource Strain (CARDIA)    Difficulty of Paying Living Expenses: Not hard at all  Food Insecurity: No Food Insecurity (06/24/2023)   Hunger Vital Sign    Worried About Running Out of Food in the Last Year: Never true    Ran Out of Food in the Last Year: Never true  Transportation Needs: No Transportation Needs (06/24/2023)   PRAPARE - Administrator, Civil Service (Medical): No    Lack of Transportation (Non-Medical): No  Physical Activity: Sufficiently Active (06/24/2023)   Exercise Vital Sign    Days of Exercise per Week: 5 days    Minutes of Exercise per Session: 70 min  Stress: No Stress Concern Present (06/24/2023)   [employer] of Occupational Health - Occupational Stress Questionnaire    Feeling of Stress : Not at all  Social Connections: Socially Integrated (06/24/2023)   Social Connection and Isolation Panel [NHANES]    Frequency of Communication with Friends and Family: Twice a week    Frequency of Social Gatherings with Friends and Family: Once a week    Attends Religious Services: More than 4 times per year    Active Member of [employer] or Organizations: Yes    Attends Engineer, structural: More than 4 times per year    Marital Status: Married    Tobacco Counseling Counseling given:  Not Answered   Clinical Intake:  Pre-visit preparation completed: Yes  Pain : No/denies pain     BMI - recorded: 27.88 Nutritional Status: BMI 25 -29 Overweight Nutritional Risks: None Diabetes: No  How often do you need to have someone help you when you read instructions, pamphlets, or other written materials from your doctor or pharmacy?: 1 - Never  Interpreter Needed?: No  Information entered by :: Lanier Ensign, LPN   Activities of Daily Living    06/24/2023    2:11 PM  In your present state of health, do you have any difficulty performing the following activities:  Hearing? 0  Vision? 0  Difficulty concentrating  or making decisions? 0  Walking or climbing stairs? 0  Dressing or bathing? 0  Doing errands, shopping? 0  Preparing Food and eating ? N  Using the Toilet? N  In the past six months, have you accidently leaked urine? N  Do you have problems with loss of bowel control? N  Managing your Medications? N  Managing your Finances? N  Housekeeping or managing your Housekeeping? N    Patient Care Team: Ardith Dark, MD as PCP - General (Family Medicine) Irena Cords, Enzo Montgomery, MD as Consulting Physician (Allergy and Immunology)  Indicate any recent Medical Services you may have received from other than [facility] providers in the past year (date may be approximate).     Assessment:   This is a routine wellness examination for Jeremy.  Hearing/Vision screen Hearing Screening - Comments:: Pt denies any hearing issues  Vision Screening - Comments:: Pt follows up with [facility] for annual eye exams    Goals Addressed             This Visit's Progress    Patient Stated       To get down to 170 lbs        Depression Screen    06/28/2023   11:24 AM 11/29/2022    9:48 AM 06/22/2022   11:18 AM 04/07/2022    7:59 AM 04/14/2021    8:06 AM 03/25/2021    9:41 AM 04/12/2020    9:46 AM  PHQ 2/9 Scores  PHQ - 2 Score 0 0 0 0 0 0 0    Fall Risk     06/24/2023    2:11 PM 11/29/2022    9:48 AM 06/22/2022   11:20 AM 04/07/2022    7:59 AM 04/14/2021    8:09 AM  Fall Risk   Falls in the past year? 0 0 0 0 0  Number falls in past yr: 0 0 0 0 0  Injury with Fall? 0 0 0 0 0  Risk for fall due to : No Fall Risks No Fall Risks No Fall Risks;Impaired vision No Fall Risks Impaired vision  Follow up Falls prevention discussed  Falls prevention discussed  Falls prevention discussed    MEDICARE RISK AT HOME: Medicare Risk at Home Any stairs in or around the home?: No If so, are there any without handrails?: No Home free of loose throw rugs in walkways, pet beds, electrical cords, etc?: No Adequate lighting in your home to reduce risk of falls?: Yes Life alert?: No Use of a cane, walker or w/c?: No Grab bars in the bathroom?: Yes Shower chair or bench in shower?: Yes Elevated toilet seat or a handicapped toilet?: No  TIMED UP AND GO:  Was the test performed?  No    Cognitive Function:        06/28/2023   11:47 AM 06/22/2022   11:22 AM 04/14/2021    8:11 AM 04/12/2020    9:55 AM  6CIT Screen  What Year? 0 points 0 points 0 points 0 points  What month? 0 points 0 points 0 points 0 points  What time? 0 points 0 points 0 points 0 points  Count back from 20 0 points 0 points 0 points 0 points  Months in reverse 0 points 0 points 0 points 0 points  Repeat phrase 0 points 2 points 4 points 2 points  Total Score 0 points 2 points 4 points 2 points    Immunizations Immunization History  Administered Date(s)  Administered   Fluad Quad(high Dose 65+) 06/05/2019, 08/03/2022   Influenza Split 06/05/2017   Influenza, High Dose Seasonal PF 07/13/2015, 08/01/2017, 06/21/2018, 07/30/2018, 08/03/2020, 06/15/2021, 08/03/2021, 06/25/2023   Influenza,inj,Quad PF,6+ Mos 07/03/2015, 06/05/2017, 06/05/2017, 07/19/2017   Influenza-Unspecified 06/05/2016, 06/14/2020   PFIZER Comirnaty(Gray Top)Covid-19 Tri-Sucrose Vaccine 01/24/2021   PFIZER(Purple  Top)SARS-COV-2 Vaccination 10/22/2019, 11/12/2019, 07/01/2020   Pfizer Covid-19 Vaccine Bivalent Booster 34yrs & up 06/15/2021   Pfizer Covid-19 Vaccine Bivalent Booster 5y-11y 02/20/2022   Pfizer(Comirnaty)Fall Seasonal Vaccine 12 years and older 08/03/2022   Pneumococcal Conjugate-13 09/16/2015   Pneumococcal Polysaccharide-23 03/20/2018, 07/30/2018, 08/03/2020, 08/03/2021   Rsv, Bivalent, Protein Subunit Rsvpref,pf Verdis Frederickson) 06/25/2023   Tdap 09/19/2006, 03/19/2017   Zoster Recombinant(Shingrix) 03/30/2018, 07/08/2018   Zoster, Live 03/05/2013    TDAP status: Due, Education has been provided regarding the importance of this vaccine. Advised may receive this vaccine at local pharmacy or Health Dept. Aware to provide a copy of the vaccination record if obtained from local pharmacy or Health Dept. Verbalized acceptance and understanding.  Flu Vaccine status: Up to date  Pneumococcal vaccine status: Up to date  Covid-19 vaccine status: Information provided on how to obtain vaccines.   Qualifies for Shingles Vaccine? Yes   Zostavax completed Yes   Shingrix Completed?: Yes  Screening Tests Health Maintenance  Topic Date Due   COVID-19 Vaccine (8 - 2023-24 season) 06/03/2023   Medicare Annual Wellness (AWV)  06/27/2024   DTaP/Tdap/Td (3 - Td or Tdap) 03/20/2027   Colonoscopy  05/13/2028   Pneumonia Vaccine 61+ Years old  Completed   INFLUENZA VACCINE  Completed   Hepatitis C Screening  Completed   Zoster Vaccines- Shingrix  Completed   HPV VACCINES  Aged Out    Health Maintenance  Health Maintenance Due  Topic Date Due   COVID-19 Vaccine (8 - 2023-24 season) 06/03/2023    Colorectal cancer screening: Type of screening: Colonoscopy. Completed 05/13/18. Repeat every 10 years   Additional Screening:  Hepatitis C Screening:  Completed 03/19/17  Vision Screening: Recommended annual ophthalmology exams for early detection of glaucoma and other disorders of the eye. Is the  patient up to date with their annual eye exam?  Yes  Who is the provider or what is the name of the office in which the patient attends annual eye exams? [facility]  If pt is not established with a provider, would they like to be referred to a provider to establish care? No .   Dental Screening: Recommended annual dental exams for proper oral hygiene   Community Resource Referral / Chronic Care Management: CRR required this visit?  No   CCM required this visit?  No     Plan:     I have personally reviewed and noted the following in the patient's chart:   Medical and social history Use of alcohol, tobacco or illicit drugs  Current medications and supplements including opioid prescriptions. Patient is not currently taking opioid prescriptions. Functional ability and status Nutritional status Physical activity Advanced directives List of other physicians Hospitalizations, surgeries, and ER visits in previous 12 months Vitals Screenings to include cognitive, depression, and falls Referrals and appointments  In addition, I have reviewed and discussed with patient certain preventive protocols, quality metrics, and best practice recommendations. A written personalized care plan for preventive services as well as general preventive health recommendations were provided to patient.     Marzella Schlein, LPN   05/12/9146   After Visit Summary: (MyChart) Due to this being a telephonic  visit, the after visit summary with patients personalized plan was offered to patient via MyChart   Nurse Notes: none

## 2023-08-10 ENCOUNTER — Other Ambulatory Visit: Payer: Self-pay | Admitting: Family Medicine

## 2023-08-10 DIAGNOSIS — K219 Gastro-esophageal reflux disease without esophagitis: Secondary | ICD-10-CM

## 2023-08-10 DIAGNOSIS — I10 Essential (primary) hypertension: Secondary | ICD-10-CM

## 2023-10-02 ENCOUNTER — Ambulatory Visit (INDEPENDENT_AMBULATORY_CARE_PROVIDER_SITE_OTHER): Payer: Medicare HMO | Admitting: Family Medicine

## 2023-10-02 VITALS — BP 128/76 | HR 72 | Temp 98.4°F | Ht 67.0 in | Wt 185.0 lb

## 2023-10-02 DIAGNOSIS — J301 Allergic rhinitis due to pollen: Secondary | ICD-10-CM

## 2023-10-02 DIAGNOSIS — I10 Essential (primary) hypertension: Secondary | ICD-10-CM | POA: Diagnosis not present

## 2023-10-02 DIAGNOSIS — H9201 Otalgia, right ear: Secondary | ICD-10-CM

## 2023-10-02 DIAGNOSIS — R7303 Prediabetes: Secondary | ICD-10-CM

## 2023-10-02 MED ORDER — AMOXICILLIN-POT CLAVULANATE 875-125 MG PO TABS: 1.0000 | ORAL_TABLET | Freq: Two times a day (BID) | ORAL | 0 refills | Status: DC

## 2023-10-02 MED ORDER — PREDNISONE 50 MG PO TABS: ORAL_TABLET | ORAL | 0 refills | Status: DC

## 2023-10-02 NOTE — Assessment & Plan Note (Signed)
 Well controlled on amlodipine 10mg  daily

## 2023-10-02 NOTE — Assessment & Plan Note (Signed)
Continue management per allergist.  Likely contributing to his ear fullness/pain and eustachian tube dysfunction.

## 2023-10-02 NOTE — Assessment & Plan Note (Signed)
He will come back soon for A1c.

## 2023-10-02 NOTE — Patient Instructions (Signed)
 It was very nice to see you today!  You have fluid and pressure build up behind your eardrum.  Start the prednisone .  Start Augmentin  if not proving.  Return if symptoms worsen or fail to improve.   Take care, Dr Kennyth  PLEASE NOTE:  If you had any lab tests, please let us  know if you have not heard back within a few days. You may see your results on mychart before we have a chance to review them but we will give you a call once they are reviewed by us .   If we ordered any referrals today, please let us  know if you have not heard from their office within the next week.   If you had any urgent prescriptions sent in today, please check with the pharmacy within an hour of our visit to make sure the prescription was transmitted appropriately.   Please try these tips to maintain a healthy lifestyle:  Eat at least 3 REAL meals and 1-2 snacks per day.  Aim for no more than 5 hours between eating.  If you eat breakfast, please do so within one hour of getting up.   Each meal should contain half fruits/vegetables, one quarter protein, and one quarter carbs (no bigger than a computer mouse)  Cut down on sweet beverages. This includes juice, soda, and sweet tea.   Drink at least 1 glass of water with each meal and aim for at least 8 glasses per day  Exercise at least 150 minutes every week.

## 2023-10-02 NOTE — Progress Notes (Signed)
   Brendan Simpson is a 72 y.o. male who presents today for an office visit.  Assessment/Plan:  New/Acute Problems: Right Ear Pain No signs of otitis media on exam though does have middle ear effusion and some evidence of eustachian tube dysfunction.  Given his degree of pain we will start prednisone .  Will also send in pocket prescription for Augmentin  with instruction to not start unless symptoms fail to improve or worsen over the next few days.  We did discuss referral to ENT for a recurrent ear pain and otitis media however he deferred for today.  He will come back to see me in about a week and a half for routine follow-up visit and we can recheck at that point though if this continues to be an ongoing issue he will need to see ENT for this.  Chronic Problems Addressed Today: Benign essential hypertension Well-controlled on amlodipine  10 mg daily.  Seasonal allergic rhinitis due to pollen Continue management per allergist.  Likely contributing to his ear fullness/pain and eustachian tube dysfunction.   Prediabetes He will come back soon for A1c.     Subjective:  HPI:  See Assessment / plan for status of chronic conditions.  Patient is here today with 2 days of right ear pain.  He is concerned about potential ear infection.  He has seen his allergist twice over the last few months and was diagnosed with otitis media.  Started on Augmentin .  Symptoms cleared shortly after doing this.  He is not currently having any other symptoms.  No decreased hearing.  No head pain.  No fevers or chills.  No cough or congestion.  No treatments tried.       Objective:  Physical Exam: BP 128/76   Pulse 72   Temp 98.4 F (36.9 C) (Temporal)   Ht 5' 7 (1.702 m)   Wt 185 lb (83.9 kg)   SpO2 98%   BMI 28.98 kg/m   Wt Readings from Last 3 Encounters:  10/02/23 185 lb (83.9 kg)  06/28/23 178 lb (80.7 kg)  04/10/23 175 lb (79.4 kg)    Gen: No acute distress, resting comfortably HEENT: Left TM  clear.  Right TM with clear effusion.  No erythema.  Hearing grossly intact bilaterally with finger rub.  CV: Regular rate and rhythm with no murmurs appreciated Pulm: Normal work of breathing, clear to auscultation bilaterally with no crackles, wheezes, or rhonchi Neuro: Grossly normal, moves all extremities Psych: Normal affect and thought content      Macie Baum M. Kennyth, MD 10/02/2023 11:48 AM

## 2023-10-11 ENCOUNTER — Ambulatory Visit (INDEPENDENT_AMBULATORY_CARE_PROVIDER_SITE_OTHER): Payer: Medicare HMO | Admitting: Family Medicine

## 2023-10-11 ENCOUNTER — Encounter: Payer: Self-pay | Admitting: Family Medicine

## 2023-10-11 VITALS — BP 122/76 | HR 72 | Temp 98.3°F | Ht 67.0 in | Wt 187.0 lb

## 2023-10-11 DIAGNOSIS — R7303 Prediabetes: Secondary | ICD-10-CM

## 2023-10-11 DIAGNOSIS — N401 Enlarged prostate with lower urinary tract symptoms: Secondary | ICD-10-CM | POA: Diagnosis not present

## 2023-10-11 DIAGNOSIS — E039 Hypothyroidism, unspecified: Secondary | ICD-10-CM | POA: Diagnosis not present

## 2023-10-11 DIAGNOSIS — I10 Essential (primary) hypertension: Secondary | ICD-10-CM | POA: Diagnosis not present

## 2023-10-11 DIAGNOSIS — R351 Nocturia: Secondary | ICD-10-CM | POA: Diagnosis not present

## 2023-10-11 LAB — URINALYSIS, ROUTINE W REFLEX MICROSCOPIC
Bilirubin Urine: NEGATIVE
Hgb urine dipstick: NEGATIVE
Ketones, ur: NEGATIVE
Nitrite: NEGATIVE
Specific Gravity, Urine: <=1.005 — AB (ref 1.000–1.030)
Total Protein, Urine: NEGATIVE
Urine Glucose: NEGATIVE
Urobilinogen, UA: 0.2 (ref 0.0–1.0)
pH: 6.5 (ref 5.0–8.0)

## 2023-10-11 LAB — TSH: TSH: 4.25 u[IU]/mL (ref 0.35–5.50)

## 2023-10-11 LAB — HEMOGLOBIN A1C: Hgb A1c MFr Bld: 6.4 % (ref 4.6–6.5)

## 2023-10-11 LAB — PSA: PSA: 1.46 ng/mL (ref 0.10–4.00)

## 2023-10-11 NOTE — Assessment & Plan Note (Signed)
 Check A1c.

## 2023-10-11 NOTE — Assessment & Plan Note (Signed)
 Following with urology.  Will recheck PSA today.  He will continue Flomax and finasteride per urology.  He is having a little bit more urinary pressure and frequency-will check UA and urine culture today as well.  Will defer further management to urology.

## 2023-10-11 NOTE — Assessment & Plan Note (Signed)
 Check TSH.  He is on Synthroid 100 mcg daily and tolerating well.

## 2023-10-11 NOTE — Progress Notes (Signed)
   Brendan Simpson is a 73 y.o. male who presents today for an office visit.  Assessment/Plan:  Chronic Problems Addressed Today: BPH (benign prostatic hyperplasia) Following with urology.  Will recheck PSA today.  He will continue Flomax  and finasteride per urology.  He is having a little bit more urinary pressure and frequency-will check UA and urine culture today as well.  Will defer further management to urology.  Prediabetes Check A1c.   Hypothyroidism Check TSH.  He is on Synthroid  100 mcg daily and tolerating well.    Subjective:  HPI:  See assessment / plan for status of chronic conditions.  Patient is here for follow-up.  He was here for his annual physical 6 months ago.  At that time he was overall doing well however was having more LUTS as well. He is following with urology for this as well. He is on finasteride and Flomax .  Still having some occasional pressure with urination.  He would like to have labs rechecked today.   He was seen here about a week ago for ear infection. This has resolved.        Objective:  Physical Exam: BP 122/76   Pulse 72   Temp 98.3 F (36.8 C) (Temporal)   Ht 5' 7 (1.702 m)   Wt 187 lb (84.8 kg)   SpO2 99%   BMI 29.29 kg/m   Gen: No acute distress, resting comfortably HEENT: TMs clear bilaterally. CV: Regular rate and rhythm with no murmurs appreciated Pulm: Normal work of breathing, clear to auscultation bilaterally with no crackles, wheezes, or rhonchi Neuro: Grossly normal, moves all extremities Psych: Normal affect and thought content      Donyale Berthold M. Kennyth, MD 10/11/2023 9:36 AM

## 2023-10-11 NOTE — Patient Instructions (Addendum)
 It was very nice to see you today!  We will check labs today.   Return in about 6 months (around 04/09/2024).   Take care, Dr Kennyth  PLEASE NOTE:  If you had any lab tests, please let us  know if you have not heard back within a few days. You may see your results on mychart before we have a chance to review them but we will give you a call once they are reviewed by us .   If we ordered any referrals today, please let us  know if you have not heard from their office within the next week.   If you had any urgent prescriptions sent in today, please check with the pharmacy within an hour of our visit to make sure the prescription was transmitted appropriately.   Please try these tips to maintain a healthy lifestyle:  Eat at least 3 REAL meals and 1-2 snacks per day.  Aim for no more than 5 hours between eating.  If you eat breakfast, please do so within one hour of getting up.   Each meal should contain half fruits/vegetables, one quarter protein, and one quarter carbs (no bigger than a computer mouse)  Cut down on sweet beverages. This includes juice, soda, and sweet tea.   Drink at least 1 glass of water with each meal and aim for at least 8 glasses per day  Exercise at least 150 minutes every week.

## 2023-10-12 LAB — URINE CULTURE
MICRO NUMBER:: 15938217
Result:: NO GROWTH
SPECIMEN QUALITY:: ADEQUATE

## 2023-10-15 NOTE — Progress Notes (Signed)
 His urinalysis does look like he had some inflammation in there however no signs of infection as his urine culture was negative.  If he is still having a lot of urgency and frequency recommended he follow-up with urology soon to discuss.  His PSA is normal.  His A1c is borderline elevated but stable compared to the last couple of times that we checked.  We do not need to start meds but he should continue to work on diet and exercise.  We can recheck in 6 months.

## 2023-11-16 ENCOUNTER — Other Ambulatory Visit: Payer: Self-pay | Admitting: Family Medicine

## 2023-11-16 DIAGNOSIS — E7800 Pure hypercholesterolemia, unspecified: Secondary | ICD-10-CM

## 2024-03-06 ENCOUNTER — Other Ambulatory Visit: Payer: Self-pay | Admitting: Family Medicine

## 2024-03-06 DIAGNOSIS — K219 Gastro-esophageal reflux disease without esophagitis: Secondary | ICD-10-CM

## 2024-04-02 ENCOUNTER — Ambulatory Visit (INDEPENDENT_AMBULATORY_CARE_PROVIDER_SITE_OTHER): Admitting: Family Medicine

## 2024-04-02 ENCOUNTER — Encounter: Payer: Self-pay | Admitting: Family Medicine

## 2024-04-02 VITALS — BP 130/80 | HR 82 | Temp 97.3°F | Ht 67.0 in | Wt 190.4 lb

## 2024-04-02 DIAGNOSIS — J301 Allergic rhinitis due to pollen: Secondary | ICD-10-CM

## 2024-04-02 DIAGNOSIS — I10 Essential (primary) hypertension: Secondary | ICD-10-CM

## 2024-04-02 DIAGNOSIS — M1A09X0 Idiopathic chronic gout, multiple sites, without tophus (tophi): Secondary | ICD-10-CM | POA: Diagnosis not present

## 2024-04-02 DIAGNOSIS — H6120 Impacted cerumen, unspecified ear: Secondary | ICD-10-CM | POA: Diagnosis not present

## 2024-04-02 NOTE — Patient Instructions (Signed)
 It was very nice to see you today!  We flushed out your ears today.  You can use hydrogen peroxide or Debrox as needed to prevent buildup.   Return if symptoms worsen or fail to improve.   Take care, Dr Kennyth  PLEASE NOTE:  If you had any lab tests, please let us  know if you have not heard back within a few days. You may see your results on mychart before we have a chance to review them but we will give you a call once they are reviewed by us .   If we ordered any referrals today, please let us  know if you have not heard from their office within the next week.   If you had any urgent prescriptions sent in today, please check with the pharmacy within an hour of our visit to make sure the prescription was transmitted appropriately.   Please try these tips to maintain a healthy lifestyle:  Eat at least 3 REAL meals and 1-2 snacks per day.  Aim for no more than 5 hours between eating.  If you eat breakfast, please do so within one hour of getting up.   Each meal should contain half fruits/vegetables, one quarter protein, and one quarter carbs (no bigger than a computer mouse)  Cut down on sweet beverages. This includes juice, soda, and sweet tea.   Drink at least 1 glass of water with each meal and aim for at least 8 glasses per day  Exercise at least 150 minutes every week.

## 2024-04-02 NOTE — Assessment & Plan Note (Signed)
At goal today on amlodipine 10 mg daily. 

## 2024-04-02 NOTE — Progress Notes (Signed)
   Brendan Simpson is a 73 y.o. male who presents today for an office visit.  Assessment/Plan:  New/Acute Problems: Cerumen impaction Successfully irrigated by CMA today.  Advised for him to use over-the-counter Debrox or hydrogen peroxide as needed to prevent buildup.  He will let us  know if he has any recurrences.  He will follow-up as needed.  Chronic Problems Addressed Today: Benign essential hypertension At goal today on amlodipine  10 mg daily.  Seasonal allergic rhinitis due to pollen Continue management per allergist.  Has had a mild flare of her symptoms recently.  Currently getting allergy shots.     Subjective:  HPI:  See assessment / plan for status of chronic conditions. Patient here with decreased hearing in both ears. Started yesterday after showering and feeling like he got water in his ears. Tried using peroxide to the area without much improvement though did note a lot of wax came out.       Objective:  Physical Exam: BP 130/80 (BP Location: Left Arm, Patient Position: Sitting, Cuff Size: Normal)   Pulse 82   Temp (!) 97.3 F (36.3 C) (Temporal)   Ht 5' 7 (1.702 m)   Wt 190 lb 6 oz (86.4 kg)   SpO2 98%   BMI 29.82 kg/m   Gen: No acute distress, resting comfortably HEENT: Bilateral EAC obscured by impacted cerumen. CV: Regular rate and rhythm with no murmurs appreciated Pulm: Normal work of breathing, clear to auscultation bilaterally with no crackles, wheezes, or rhonchi Neuro: Grossly normal, moves all extremities Psych: Normal affect and thought content      Dierdre Mccalip M. Kennyth, MD 04/02/2024 9:25 AM

## 2024-04-02 NOTE — Assessment & Plan Note (Signed)
 Continue management per allergist.  Has had a mild flare of her symptoms recently.  Currently getting allergy shots.

## 2024-04-10 ENCOUNTER — Ambulatory Visit (INDEPENDENT_AMBULATORY_CARE_PROVIDER_SITE_OTHER): Admitting: Family Medicine

## 2024-04-10 VITALS — BP 138/86 | HR 79 | Temp 98.1°F | Ht 67.0 in | Wt 191.0 lb

## 2024-04-10 DIAGNOSIS — J329 Chronic sinusitis, unspecified: Secondary | ICD-10-CM | POA: Diagnosis not present

## 2024-04-10 DIAGNOSIS — H6120 Impacted cerumen, unspecified ear: Secondary | ICD-10-CM | POA: Diagnosis not present

## 2024-04-10 DIAGNOSIS — R040 Epistaxis: Secondary | ICD-10-CM

## 2024-04-10 DIAGNOSIS — I10 Essential (primary) hypertension: Secondary | ICD-10-CM

## 2024-04-10 MED ORDER — AMOXICILLIN-POT CLAVULANATE 875-125 MG PO TABS: 1.0000 | ORAL_TABLET | Freq: Two times a day (BID) | ORAL | 0 refills | Status: DC

## 2024-04-10 MED ORDER — AZELASTINE HCL 0.1 % NA SOLN: 2.0000 | Freq: Two times a day (BID) | NASAL | 12 refills | Status: AC

## 2024-04-10 NOTE — Progress Notes (Signed)
   Brendan Simpson is a 73 y.o. male who presents today for an office visit.  Assessment/Plan:  New/Acute Problems: Sinusitis  No red flags.  Recommended against long-term use of oxymetazoline.  We will start Astelin .  Also send prescription in pocket prescription for Augmentin .  He can continue other over-the-counter meds as needed as well.  Encouraged hydration.  He will let us  know if not improving.  Cerumen Impaction  Patient had irrigation last week with success though still does have adhered cerumen on TMs bilaterally.  They have been using over-the-counter Debrox without much improvement.  Will refer to ENT for further management.  Chronic Problems Addressed Today: Benign essential hypertension At goal today on amlodipine  10 mg daily.  Epistaxis He is still having ongoing issues with this.  He has seen ENT in the past but they did not cauterize.  Will refer him to ENT for further evaluation.     Subjective:  HPI:  See assessment / plan for status of chronic conditions. He is here today with nasal congestion, rhinorrhea, and cough. Started about a week ago. Symptoms seem to be getting worse. He tried using OTC medications including Nyquil and benedryl without much benefit.  He has been using over-the-counter oxymetazoline with some improvement.  Still having muffled hearing from last week.          Objective:  Physical Exam: BP 138/86 (BP Location: Left Arm, Patient Position: Sitting, Cuff Size: Normal)   Pulse 79   Temp 98.1 F (36.7 C) (Temporal)   Ht 5' 7 (1.702 m)   Wt 191 lb (86.6 kg)   SpO2 97%   BMI 29.91 kg/m   Gen: No acute distress, resting comfortably HEENT: Cerumen adhered to bilateral TMs.  TMs otherwise clear without effusion.  OP erythematous.  Nasal mucosa erythematous and edematous bilaterally. CV: Regular rate and rhythm with no murmurs appreciated Pulm: Normal work of breathing, clear to auscultation bilaterally with no crackles, wheezes, or  rhonchi Neuro: Grossly normal, moves all extremities Psych: Normal affect and thought content      Azusena Erlandson M. Kennyth, MD 04/10/2024 11:18 AM

## 2024-04-10 NOTE — Assessment & Plan Note (Signed)
 He is still having ongoing issues with this.  He has seen ENT in the past but they did not cauterize.  Will refer him to ENT for further evaluation.

## 2024-04-10 NOTE — Patient Instructions (Signed)
 It was very nice to see you today!  Please start the Astelin .  Start the antibiotic if not improving  Please continue taking the Debrox for your earwax.  I will refer you to ENT for this as well.  Return if symptoms worsen or fail to improve.   Take care, Dr Kennyth  PLEASE NOTE:  If you had any lab tests, please let us  know if you have not heard back within a few days. You may see your results on mychart before we have a chance to review them but we will give you a call once they are reviewed by us .   If we ordered any referrals today, please let us  know if you have not heard from their office within the next week.   If you had any urgent prescriptions sent in today, please check with the pharmacy within an hour of our visit to make sure the prescription was transmitted appropriately.   Please try these tips to maintain a healthy lifestyle:  Eat at least 3 REAL meals and 1-2 snacks per day.  Aim for no more than 5 hours between eating.  If you eat breakfast, please do so within one hour of getting up.   Each meal should contain half fruits/vegetables, one quarter protein, and one quarter carbs (no bigger than a computer mouse)  Cut down on sweet beverages. This includes juice, soda, and sweet tea.   Drink at least 1 glass of water with each meal and aim for at least 8 glasses per day  Exercise at least 150 minutes every week.

## 2024-04-10 NOTE — Assessment & Plan Note (Signed)
At goal today on amlodipine 10 mg daily. 

## 2024-04-11 ENCOUNTER — Encounter (INDEPENDENT_AMBULATORY_CARE_PROVIDER_SITE_OTHER): Payer: Self-pay

## 2024-04-15 ENCOUNTER — Ambulatory Visit (INDEPENDENT_AMBULATORY_CARE_PROVIDER_SITE_OTHER): Payer: Medicare HMO | Admitting: Family Medicine

## 2024-04-15 ENCOUNTER — Encounter: Payer: Self-pay | Admitting: Family Medicine

## 2024-04-15 VITALS — BP 137/76 | HR 68 | Temp 97.3°F | Ht 67.0 in | Wt 188.4 lb

## 2024-04-15 DIAGNOSIS — K219 Gastro-esophageal reflux disease without esophagitis: Secondary | ICD-10-CM

## 2024-04-15 DIAGNOSIS — E7800 Pure hypercholesterolemia, unspecified: Secondary | ICD-10-CM | POA: Diagnosis not present

## 2024-04-15 DIAGNOSIS — M1A09X0 Idiopathic chronic gout, multiple sites, without tophus (tophi): Secondary | ICD-10-CM

## 2024-04-15 DIAGNOSIS — N401 Enlarged prostate with lower urinary tract symptoms: Secondary | ICD-10-CM

## 2024-04-15 DIAGNOSIS — R7303 Prediabetes: Secondary | ICD-10-CM | POA: Diagnosis not present

## 2024-04-15 DIAGNOSIS — Z0001 Encounter for general adult medical examination with abnormal findings: Secondary | ICD-10-CM

## 2024-04-15 DIAGNOSIS — R351 Nocturia: Secondary | ICD-10-CM | POA: Diagnosis not present

## 2024-04-15 DIAGNOSIS — E039 Hypothyroidism, unspecified: Secondary | ICD-10-CM

## 2024-04-15 DIAGNOSIS — I10 Essential (primary) hypertension: Secondary | ICD-10-CM

## 2024-04-15 LAB — CBC
HCT: 42.7 % (ref 39.0–52.0)
Hemoglobin: 14.4 g/dL (ref 13.0–17.0)
MCHC: 33.8 g/dL (ref 30.0–36.0)
MCV: 88.7 fl (ref 78.0–100.0)
Platelets: 195 K/uL (ref 150.0–400.0)
RBC: 4.82 Mil/uL (ref 4.22–5.81)
RDW: 13.6 % (ref 11.5–15.5)
WBC: 6.4 K/uL (ref 4.0–10.5)

## 2024-04-15 LAB — URIC ACID: Uric Acid, Serum: 6.8 mg/dL (ref 4.0–7.8)

## 2024-04-15 LAB — TSH: TSH: 1.32 u[IU]/mL (ref 0.35–5.50)

## 2024-04-15 LAB — LIPID PANEL
Cholesterol: 141 mg/dL (ref 0–200)
HDL: 44.5 mg/dL (ref >39.00)
LDL Cholesterol: 79 mg/dL (ref 0–99)
NonHDL: 96.44
Total CHOL/HDL Ratio: 3
Triglycerides: 89 mg/dL (ref 0.0–149.0)
VLDL: 17.8 mg/dL (ref 0.0–40.0)

## 2024-04-15 LAB — COMPREHENSIVE METABOLIC PANEL WITH GFR
ALT: 25 U/L (ref 0–53)
AST: 25 U/L (ref 0–37)
Albumin: 4.6 g/dL (ref 3.5–5.2)
Alkaline Phosphatase: 43 U/L (ref 39–117)
BUN: 13 mg/dL (ref 6–23)
CO2: 29 meq/L (ref 19–32)
Calcium: 9.4 mg/dL (ref 8.4–10.5)
Chloride: 104 meq/L (ref 96–112)
Creatinine, Ser: 1.1 mg/dL (ref 0.40–1.50)
GFR: 67.71 mL/min (ref >60.00)
Glucose, Bld: 103 mg/dL — ABNORMAL HIGH (ref 70–99)
Potassium: 4.1 meq/L (ref 3.5–5.1)
Sodium: 140 meq/L (ref 135–145)
Total Bilirubin: 0.6 mg/dL (ref 0.2–1.2)
Total Protein: 7 g/dL (ref 6.0–8.3)

## 2024-04-15 LAB — HEMOGLOBIN A1C: Hgb A1c MFr Bld: 6.4 % (ref 4.6–6.5)

## 2024-04-15 LAB — PSA: PSA: 0.66 ng/mL (ref 0.10–4.00)

## 2024-04-15 MED ORDER — LEVOTHYROXINE SODIUM 100 MCG PO TABS: 100.0000 ug | ORAL_TABLET | Freq: Every day | ORAL | 3 refills | Status: AC

## 2024-04-15 MED ORDER — ROSUVASTATIN CALCIUM 40 MG PO TABS: 40.0000 mg | ORAL_TABLET | Freq: Every day | ORAL | 3 refills | Status: AC

## 2024-04-15 MED ORDER — ESOMEPRAZOLE MAGNESIUM 40 MG PO CPDR: 40.0000 mg | DELAYED_RELEASE_CAPSULE | Freq: Every day | ORAL | 3 refills | Status: AC

## 2024-04-15 MED ORDER — AMLODIPINE BESYLATE 10 MG PO TABS: 10.0000 mg | ORAL_TABLET | Freq: Every day | ORAL | 3 refills | Status: AC

## 2024-04-15 NOTE — Progress Notes (Signed)
 Chief Complaint:  Brendan Simpson is a 73 y.o. male who presents today for his annual comprehensive physical exam.    Assessment/Plan:  New/Acute Problems: Cerumen impaction We saw him twice within the last couple of weeks with this.  Seems to be improving with over-the-counter Debrox.  He was referral to ENT but has not heard back from them.  We gave contact information for him to call to schedule appointment soon.  Chronic Problems Addressed Today: Prediabetes Check A1c. Discussed lifestyle modifications.   Hypothyroidism Check TSH.  He is on Synthroid  100 mcg daily.  Tolerating well.  Hyperlipidemia On Crestor  40 mg daily.  Tolerating well.  Check lipids.  Discussed lifestyle modifications.  Benign essential hypertension At goal today on amlodipine  10 mg daily.  Esophageal reflux Stable on Nexium  40 mg daily.  BPH (benign prostatic hyperplasia) Follows with urology.  Check PSA.  On Flomax  and finasteride.  Gout Doing well without any recent flares.  Check uric acid level today.  Preventative Healthcare: Check labs.  Up-to-date on vaccines.  Patient Counseling(The following topics were reviewed and/or handout was given):  -Nutrition: Stressed importance of moderation in sodium/caffeine intake, saturated fat and cholesterol, caloric balance, sufficient intake of fresh fruits, vegetables, and fiber.  -Stressed the importance of regular exercise.   -Substance Abuse: Discussed cessation/primary prevention of tobacco, alcohol, or other drug use; driving or other dangerous activities under the influence; availability of treatment for abuse.   -Injury prevention: Discussed safety belts, safety helmets, smoke detector, smoking near bedding or upholstery.   -Sexuality: Discussed sexually transmitted diseases, partner selection, use of condoms, avoidance of unintended pregnancy and contraceptive alternatives.   -Dental health: Discussed importance of regular tooth brushing,  flossing, and dental visits.  -Health maintenance and immunizations reviewed. Please refer to Health maintenance section.  Return to care in 1 year for next preventative visit.     Subjective:  HPI:  He has no acute complaints today. See Assessment / plan for status of chronic conditions. He is here today for annual physical.   Lifestyle Diet: None specific.  Exercise: Goes to [facility] regularly.      04/15/2024    9:03 AM  Depression screen PHQ 2/9  Decreased Interest 0  Down, Depressed, Hopeless 0  PHQ - 2 Score 0  Altered sleeping 0  Tired, decreased energy 0  Change in appetite 0  Feeling bad or failure about yourself  0  Trouble concentrating 0  Moving slowly or fidgety/restless 0  Suicidal thoughts 0  PHQ-9 Score 0  Difficult doing work/chores Not difficult at all    There are no preventive care reminders to display for this patient.   ROS: Per HPI, otherwise a complete review of systems was negative.   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/2018   Arthritis 02/03/2016   Esophageal reflux 02/03/2016   Gout 02/03/2016   Benign essential hypertension 02/03/2016  Hearing reduced, wears hearing aids bilaterally 02/03/2016   Hypothyroidism 02/03/2016   Hyperlipidemia 02/03/2016   Past Surgical History:  Procedure Laterality Date   CARPAL TUNNEL RELEASE Bilateral    CATARACT EXTRACTION, BILATERAL Bilateral    KNEE ARTHROSCOPY Left     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 20 tablet 0   aspirin EC 81 MG tablet Take 81 mg by mouth daily.     azelaic acid  (AZELEX ) 20 % cream Apply topically as needed for up to 90 doses. After skin is thoroughly washed and patted dry, gently but thoroughly massage a thin film of azelaic acid  cream into the affected area twice daily, in the morning and evening. 50 g 2   azelastine  (ASTELIN ) 0.1 % nasal spray Place 2 sprays into both nostrils 2 (two) times daily. 30 mL 12   EPINEPHrine 0.3 mg/0.3 mL IJ SOAJ injection See admin instructions.     finasteride (PROSCAR) 5 MG tablet      LYCOPENE PO Take 1 tablet by mouth daily.     sildenafil (VIAGRA) 100 MG tablet Take 100 mg by mouth as directed.     tamsulosin  (FLOMAX ) 0.4 MG CAPS capsule      amLODipine  (NORVASC ) 10 MG tablet Take 1 tablet (10 mg total) by mouth daily. 90 tablet 3   esomeprazole  (NEXIUM ) 40 MG capsule Take 1 capsule (40 mg total) by mouth daily. 90 capsule 3   levothyroxine  (SYNTHROID ) 100 MCG tablet Take 1 tablet (100 mcg total) by mouth daily. 90 tablet 3   rosuvastatin  (CRESTOR ) 40 MG tablet Take 1 tablet (40 mg total) by mouth daily. 90 tablet 3   No current facility-administered medications for this visit.    Allergies-reviewed and updated No Active Allergies  Social History   Socioeconomic History   Marital status: Married    Spouse name: Shneur Whittenburg   Number of children: 2   Years of education: Not on file   Highest education level: 12th grade  Occupational  History   Occupation: Facilities manager: [employer]   Occupation: Retired  Tobacco Use   Smoking status: Never   Smokeless tobacco: Never  Substance and Sexual Activity   Alcohol use: Yes    Alcohol/week: 0.0 standard drinks of alcohol    Comment: occasioinal   Drug use: No   Sexual activity: Yes    Partners: Female    Birth control/protection: None  Other Topics Concern   Not on file  Social History Narrative   [employer] 4 days per week. Aerobic and strength training. Christian. Wife is Cathlean, together nearly 50 years. Adult children are supportive. Will and POA in place. 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    Social Drivers of Health   Financial Resource Strain: Low Risk  (04/10/2024)   Overall Financial Resource Strain (CARDIA)    Difficulty of Paying Living Expenses: Not hard at all  Food Insecurity: No Food Insecurity (04/10/2024)   Hunger  Vital Sign    Worried About Programme researcher, broadcasting/film/video in the Last Year: Never true    Ran Out of Food in the Last Year: Never true  Transportation Needs: No Transportation Needs (04/10/2024)   PRAPARE - Administrator, Civil Service (Medical): No    Lack of Transportation (Non-Medical): No  Physical Activity: Sufficiently Active (04/10/2024)   Exercise Vital Sign    Days of Exercise per Week: 5 days    Minutes of Exercise per Session: 60 min  Stress: No Stress Concern Present (04/10/2024)   [employer] of Occupational Health - Occupational Stress Questionnaire    Feeling of Stress: Not at all  Social Connections: Socially Integrated (04/10/2024)   Social Connection and Isolation Panel    Frequency of Communication with Friends and Family: Twice a week    Frequency of Social Gatherings with Friends and Family: Once a week    Attends Religious Services: More than 4 times per year    Active Member of [employer] or Organizations: Yes    Attends Engineer, structural: More than 4 times per year    Marital  Status: Married        Objective:  Physical Exam: BP 137/76   Pulse 68   Temp (!) 97.3 F (36.3 C) (Temporal)   Ht 5' 7 (1.702 m)   Wt 188 lb 6.4 oz (85.5 kg)   SpO2 96%   BMI 29.51 kg/m   Body mass index is 29.51 kg/m. Wt Readings from Last 3 Encounters:  04/15/24 188 lb 6.4 oz (85.5 kg)  04/10/24 191 lb (86.6 kg)  04/02/24 190 lb 6 oz (86.4 kg)   Gen: NAD, resting comfortably HEENT: TMs normal bilaterally. OP clear. No thyromegaly noted.  CV: RRR with no murmurs appreciated Pulm: NWOB, CTAB with no crackles, wheezes, or rhonchi GI: Normal bowel sounds present. Soft, Nontender, Nondistended. MSK: no edema, cyanosis, or clubbing noted Skin: warm, dry Neuro: CN2-12 grossly intact. Strength 5/5 in upper and lower extremities. Reflexes symmetric and intact bilaterally.  Psych: Normal affect and thought content     Jackee Glasner M. Kennyth, MD 04/15/2024 9:31 AM

## 2024-04-15 NOTE — Assessment & Plan Note (Signed)
 Check A1c.  Discussed lifestyle modifications.

## 2024-04-15 NOTE — Assessment & Plan Note (Signed)
 Doing well without any recent flares.  Check uric acid level today.

## 2024-04-15 NOTE — Assessment & Plan Note (Signed)
 Follows with urology.  Check PSA.  On Flomax  and finasteride.

## 2024-04-15 NOTE — Assessment & Plan Note (Signed)
At goal today on amlodipine 10 mg daily. 

## 2024-04-15 NOTE — Assessment & Plan Note (Signed)
Stable on Nexium 40 mg daily °

## 2024-04-15 NOTE — Patient Instructions (Addendum)
 It was very nice to see you today!  I am glad that you are feeling better.  Will check blood work today I will refill your medications.  Please call [phone]  to schedule an appointment with ENT.   We will see you back in a year for your next physical.   Return in about 1 year (around 04/15/2025) for Annual Physical.   Take care, Dr Kennyth  PLEASE NOTE:  If you had any lab tests, please let us  know if you have not heard back within a few days. You may see your results on mychart before we have a chance to review them but we will give you a call once they are reviewed by us .   If we ordered any referrals today, please let us  know if you have not heard from their office within the next week.   If you had any urgent prescriptions sent in today, please check with the pharmacy within an hour of our visit to make sure the prescription was transmitted appropriately.   Please try these tips to maintain a healthy lifestyle:  Eat at least 3 REAL meals and 1-2 snacks per day.  Aim for no more than 5 hours between eating.  If you eat breakfast, please do so within one hour of getting up.   Each meal should contain half fruits/vegetables, one quarter protein, and one quarter carbs (no bigger than a computer mouse)  Cut down on sweet beverages. This includes juice, soda, and sweet tea.   Drink at least 1 glass of water with each meal and aim for at least 8 glasses per day  Exercise at least 150 minutes every week.    Preventive Care 52 Years and Older, Male Preventive care refers to lifestyle choices and visits with your health care provider that can promote health and wellness. Preventive care visits are also called wellness exams. What can I expect for my preventive care visit? Counseling During your preventive care visit, your health care provider may ask about your: Medical history, including: Past medical problems. Family medical history. History of falls. Current health,  including: Emotional well-being. Home life and relationship well-being. Sexual activity. Memory and ability to understand (cognition). Lifestyle, including: Alcohol, nicotine or tobacco, and drug use. Access to firearms. Diet, exercise, and sleep habits. Work and work Astronomer. Sunscreen use. Safety issues such as seatbelt and bike helmet use. Physical exam Your health care provider will check your: Height and weight. These may be used to calculate your BMI (body mass index). BMI is a measurement that tells if you are at a healthy weight. Waist circumference. This measures the distance around your waistline. This measurement also tells if you are at a healthy weight and may help predict your risk of certain diseases, such as type 2 diabetes and high blood pressure. Heart rate and blood pressure. Body temperature. Skin for abnormal spots. What immunizations do I need?  Vaccines are usually given at various ages, according to a schedule. Your health care provider will recommend vaccines for you based on your age, medical history, and lifestyle or other factors, such as travel or where you work. What tests do I need? Screening Your health care provider may recommend screening tests for certain conditions. This may include: Lipid and cholesterol levels. Diabetes screening. This is done by checking your blood sugar (glucose) after you have not eaten for a while (fasting). Hepatitis C test. Hepatitis B test. HIV (human immunodeficiency virus) test. STI (sexually transmitted infection) testing, if  you are at risk. Lung cancer screening. Colorectal cancer screening. Prostate cancer screening. Abdominal aortic aneurysm (AAA) screening. You may need this if you are a current or former smoker. Talk with your health care provider about your test results, treatment options, and if necessary, the need for more tests. Follow these instructions at home: Eating and drinking  Eat a diet that  includes fresh fruits and vegetables, whole grains, lean protein, and low-fat dairy products. Limit your intake of foods with high amounts of sugar, saturated fats, and salt. Take vitamin and mineral supplements as recommended by your health care provider. Do not drink alcohol if your health care provider tells you not to drink. If you drink alcohol: Limit how much you have to 0-2 drinks a day. Know how much alcohol is in your drink. In the [place] one drink equals one 12 oz bottle of beer (355 mL), one 5 oz glass of wine (148 mL), or one 1 oz glass of hard liquor (44 mL). Lifestyle Brush your teeth every morning and night with fluoride toothpaste. Floss one time each day. Exercise for at least 30 minutes 5 or more days each week. Do not use any products that contain nicotine or tobacco. These products include cigarettes, chewing tobacco, and vaping devices, such as e-cigarettes. If you need help quitting, ask your health care provider. Do not use drugs. If you are sexually active, practice safe sex. Use a condom or other form of protection to prevent STIs. Take aspirin only as told by your health care provider. Make sure that you understand how much to take and what form to take. Work with your health care provider to find out whether it is safe and beneficial for you to take aspirin daily. Ask your health care provider if you need to take a cholesterol-lowering medicine (statin). Find healthy ways to manage stress, such as: Meditation, yoga, or listening to music. Journaling. Talking to a trusted person. Spending time with friends and family. Safety Always wear your seat belt while driving or riding in a vehicle. Do not drive: If you have been drinking alcohol. Do not ride with someone who has been drinking. When you are tired or distracted. While texting. If you have been using any mind-altering substances or drugs. Wear a helmet and other protective equipment during sports  activities. If you have firearms in your house, make sure you follow all gun safety procedures. Minimize exposure to UV radiation to reduce your risk of skin cancer. What's next? Visit your health care provider once a year for an annual wellness visit. Ask your health care provider how often you should have your eyes and teeth checked. Stay up to date on all vaccines. This information is not intended to replace advice given to you by your health care provider. Make sure you discuss any questions you have with your health care provider. Document Revised: 03/16/2021 Document Reviewed: 03/16/2021 Elsevier Patient Education  2024 [employer].

## 2024-04-15 NOTE — Assessment & Plan Note (Addendum)
 Check TSH.  He is on Synthroid  100 mcg daily.  Tolerating well.

## 2024-04-15 NOTE — Assessment & Plan Note (Signed)
 On Crestor  40 mg daily.  Tolerating well.  Check lipids.  Discussed lifestyle modifications.

## 2024-04-17 ENCOUNTER — Ambulatory Visit: Payer: Self-pay | Admitting: Family Medicine

## 2024-04-17 NOTE — Progress Notes (Signed)
 His A1c is stable at 6.4.  This is similar to his previous values.  It is important that he continue to work on diet and exercise and we can recheck this again in 6 to 12 months.    All of his other labs are at goal.  We can recheck in a year.

## 2024-04-22 ENCOUNTER — Encounter (INDEPENDENT_AMBULATORY_CARE_PROVIDER_SITE_OTHER): Payer: Self-pay | Admitting: Physician Assistant

## 2024-04-22 ENCOUNTER — Ambulatory Visit (INDEPENDENT_AMBULATORY_CARE_PROVIDER_SITE_OTHER): Admitting: Physician Assistant

## 2024-04-22 VITALS — BP 122/81 | HR 74

## 2024-04-22 DIAGNOSIS — R040 Epistaxis: Secondary | ICD-10-CM

## 2024-04-22 DIAGNOSIS — H6123 Impacted cerumen, bilateral: Secondary | ICD-10-CM

## 2024-04-22 NOTE — Progress Notes (Signed)
 Dear Dr. Kennyth, Here is my assessment for our mutual patient, Brendan Simpson. Thank you for allowing me the opportunity to care for your patient. Please do not hesitate to contact me should you have any other questions. Sincerely, Brendan Cohen PA-C  Otolaryngology Clinic Note Referring provider: Dr. Kennyth HPI:  Brendan Simpson is a 73 y.o. male kindly referred by Dr. Kennyth   The patient is a 73 year old gentleman seen in our office for evaluation of cerumen impaction.  Patient notes that his symptoms started with fullness in his head feeling like he was in a drum.  He went to his primary care provider to they flushed his ears.  He notes that able to get some but not all of it.  He notes continued decreased hearing bilateral.  He does note that he uses hearing aids.  He denies any significant pain, no infection, no surgery to the ears.  He also notes that he has been having intermittent nosebleeds over the last year, he notes this is bilateral.  He notes it only happens when he sneezes and seems to be predominant during the allergy season.  He notes it is easily stopped.  He was using Flonase  which he felt dried out his nose and made his nosebleeds worse now he is using azelastine .  He denies any trauma to his nose.     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/14/2021    Family History  Problem Relation Age of Onset   Breast cancer Mother    Heart disease Father    Diabetes Sister    Diabetes Brother     Colon cancer Neg Hx    Esophageal cancer Neg Hx    Rectal cancer Neg Hx    Stomach cancer Neg Hx      Social Connections: Socially Integrated (04/10/2024)   Social Connection and Isolation Panel    Frequency of Communication with Friends and Family: Twice a week    Frequency of Social Gatherings with Friends and Family: Once a week    Attends Religious Services: More than 4 times per year    Active Member of [employer] or Organizations: Yes    Attends Engineer, structural: More than 4 times per year    Marital Status: Married      Current Outpatient Medications:    amLODipine  (NORVASC ) 10 MG tablet, Take 1 tablet (10 mg total) by mouth daily., Disp: 90 tablet, Rfl: 3   amoxicillin -clavulanate (AUGMENTIN ) 875-125 MG tablet, Take 1 tablet by mouth 2 (two) times daily., Disp: 20 tablet, Rfl: 0   aspirin EC 81 MG tablet, Take 81 mg by mouth daily., Disp: , Rfl:    azelaic acid  (AZELEX ) 20 % cream, Apply topically as needed for up to 90 doses. After skin is thoroughly washed and patted dry, gently but thoroughly massage a thin film of azelaic acid  cream into the affected area twice daily, in the morning and evening.,  Disp: 50 g, Rfl: 2   azelastine  (ASTELIN ) 0.1 % nasal spray, Place 2 sprays into both nostrils 2 (two) times daily., Disp: 30 mL, Rfl: 12   EPINEPHrine 0.3 mg/0.3 mL IJ SOAJ injection, See admin instructions., Disp: , Rfl:    esomeprazole  (NEXIUM ) 40 MG capsule, Take 1 capsule (40 mg total) by mouth daily., Disp: 90 capsule, Rfl: 3   finasteride (PROSCAR) 5 MG tablet, , Disp: , Rfl:    levothyroxine  (SYNTHROID ) 100 MCG tablet, Take 1 tablet (100 mcg total) by mouth daily., Disp: 90 tablet, Rfl: 3   LYCOPENE PO, Take 1 tablet by mouth daily., Disp: , Rfl:    rosuvastatin  (CRESTOR ) 40 MG tablet, Take 1 tablet (40 mg total) by mouth daily., Disp: 90 tablet, Rfl: 3   sildenafil (VIAGRA) 100 MG tablet, Take 100 mg by mouth as directed., Disp: , Rfl:    tamsulosin  (FLOMAX ) 0.4 MG  CAPS capsule, , Disp: , Rfl:    Physical Exam:   BP 122/81   Pulse 74   SpO2 96%   Pertinent Findings  CN II-XII intact Bilateral cerumen impaction Weber 512: equal Rinne 512: AC > BC b/l  Anterior rhinoscopy: Septum midline; bilateral inferior turbinates with moderate hypertrophy, some irritation along the bilateral anterior septum, no lesions unable to visualize posterior portion No lesions of oral cavity/oropharynx; dentition within normal limits No obviously palpable neck masses/lymphadenopathy/thyromegaly No respiratory distress or stridor  Seprately Identifiable Procedures:  Procedure: Bilateral ear microscopy and cerumen removal using microscope (CPT [phone]) - Mod 50 Pre-procedure diagnosis: bilateral cerumen impaction external auditory canals Post-procedure diagnosis: same Indication: bilateral cerumen impaction; given patient's otologic complaints and history as well as for improved and comprehensive examination of external ear and tympanic membrane, bilateral otologic examination using microscope was performed and impacted cerumen removed  Procedure: Patient was placed semi-recumbent. Both ear canals were examined using the microscope with findings above. Cerumen removed from bilateral external auditory canals using suction and currette with improvement in EAC examination and patency. Left: EAC was patent. TM was intact . Middle ear was aerated. Drainage: none Right: EAC was patent. TM was intact . Middle ear was aerated . Drainage: none Patient tolerated the procedure well.   Impression & Plans:  Brendan Simpson is a 73 y.o. male with the following   Cerumen impaction-  The patient presented today with cerumen impaction.  This was removed without difficulty.  I see no signs of infection.  The patient will reach out to the office if she develops any new or worsening signs or symptoms.   Epistaxis-  With intermittent epistaxis, this is bilateral, easily controlled.  This  seems to be more prominent when using Flonase  and also during allergy season.  Anterior nasal speculum showed some irritation along the bilateral anterior septum with no suspicious lesions.  I discussed options including conservative approach using Vaseline at the nare, also nasal saline sprays.  The patient would like to proceed conservatively, if he continues to have symptoms despite using the above I will perform nasal endoscopy, if he has any worsening symptoms he will follow-up sooner.  - f/u 6 months   Thank you for allowing me the opportunity to care for your patient. Please do not hesitate to contact me should you have any other questions.  Sincerely, Brendan Cohen PA-C [facility] Phone: [phone] Fax: [phone]  04/22/2024, 9:09 AM

## 2024-06-04 ENCOUNTER — Ambulatory Visit (INDEPENDENT_AMBULATORY_CARE_PROVIDER_SITE_OTHER): Admitting: Podiatry

## 2024-06-04 ENCOUNTER — Ambulatory Visit (INDEPENDENT_AMBULATORY_CARE_PROVIDER_SITE_OTHER)

## 2024-06-04 ENCOUNTER — Encounter: Payer: Self-pay | Admitting: Podiatry

## 2024-06-04 VITALS — Ht 67.0 in | Wt 188.0 lb

## 2024-06-04 DIAGNOSIS — M7750 Other enthesopathy of unspecified foot: Secondary | ICD-10-CM

## 2024-06-04 DIAGNOSIS — M79675 Pain in left toe(s): Secondary | ICD-10-CM

## 2024-06-04 DIAGNOSIS — B351 Tinea unguium: Secondary | ICD-10-CM | POA: Diagnosis not present

## 2024-06-04 DIAGNOSIS — M79674 Pain in right toe(s): Secondary | ICD-10-CM

## 2024-06-04 DIAGNOSIS — M7751 Other enthesopathy of right foot: Secondary | ICD-10-CM

## 2024-06-04 DIAGNOSIS — M7661 Achilles tendinitis, right leg: Secondary | ICD-10-CM

## 2024-06-04 MED ORDER — DICLOFENAC SODIUM 75 MG PO TBEC: 75.0000 mg | DELAYED_RELEASE_TABLET | Freq: Two times a day (BID) | ORAL | 2 refills | Status: AC

## 2024-06-04 NOTE — Progress Notes (Signed)
 Subjective:   Patient ID: Brendan Simpson, male   DOB: 73 y.o.   MRN: [id number]   HPI Patient presents stating he is getting a lot of discomfort in his right Achilles tendon for the last couple months and he has nail disease with difficulty in cutting them and states they get thick.  Presents with caregiver today does not smoke likes to be active   Review of Systems  All other systems reviewed and are negative.       Objective:  Physical Exam Vitals and nursing note reviewed.  Constitutional:      Appearance: He is well-developed.  Pulmonary:     Effort: Pulmonary effort is normal.  Musculoskeletal:        General: Normal range of motion.  Skin:    General: Skin is warm.  Neurological:     Mental Status: He is alert.     Neurovascular status intact muscle strength found to be adequate range of motion adequate with patient noted to have discomfort in the Achilles tendon right in the muscle tendon junction.  States it has improved some but still somewhat bothersome to him and he has been stretching and likes to be active.  Also complains of thick yellow brittle nailbeds 1-5 both feet that are bothersome and he cannot cut them patient does not smoke likes to be active     Assessment:  Achilles tendinitis of the muscle tendon junction right moderate in intensity with equinus condition and mycotic toenail infection with pain 1-5 both feet     Plan:  H&P both conditions reviewed and discussed.  Organ to start stretching exercises and oral anti-inflammatory diclofenac  for the inflammation and heat ice therapy and heel lift therapy.  I then went ahead debrided nailbeds 1-5 both feet no iatrogenic bleeding reappoint routine care  X-rays indicate minimal spur no indication of other pathology associated with condition

## 2024-06-04 NOTE — Patient Instructions (Signed)

## 2024-07-01 ENCOUNTER — Ambulatory Visit (INDEPENDENT_AMBULATORY_CARE_PROVIDER_SITE_OTHER): Payer: Medicare HMO

## 2024-07-01 VITALS — Ht 67.0 in | Wt 170.0 lb

## 2024-07-01 DIAGNOSIS — Z0000 Encounter for general adult medical examination without abnormal findings: Secondary | ICD-10-CM | POA: Diagnosis not present

## 2024-07-01 NOTE — Progress Notes (Signed)
 Subjective:   Brendan Simpson is a 73 y.o. who presents for a Medicare Wellness preventive visit.  As a reminder, Annual Wellness Visits don't include a physical exam, and some assessments may be limited, especially if this visit is performed virtually. We may recommend an in-person follow-up visit with your provider if needed.  Visit Complete: Virtual I connected with  Brendan Simpson on 07/01/24 by a video and audio enabled telemedicine application and verified that I am speaking with the correct person using two identifiers.  Patient Location: Home  Provider Location: Office/Clinic  I discussed the limitations of evaluation and management by telemedicine. The patient expressed understanding and agreed to proceed.  Vital Signs: Because this visit was a virtual/telehealth visit, some criteria may be missing or patient reported. Any vitals not documented were not able to be obtained and vitals that have been documented are patient reported.    Persons Participating in Visit: Patient.  AWV Questionnaire: No: Patient Medicare AWV questionnaire was not completed prior to this visit.  Cardiac Risk Factors include: advanced age (>62men, >49 women);dyslipidemia;hypertension;male gender     Objective:    Today's Vitals   07/01/24 0923  Weight: 170 lb (77.1 kg)  Height: 5' 7 (1.702 m)   Body mass index is 26.63 kg/m.     07/01/2024    9:28 AM 06/28/2023   11:24 AM 06/22/2022   11:19 AM 05/07/2021    7:07 AM 04/14/2021    8:08 AM 04/12/2020    9:49 AM 03/26/2019    4:33 PM  Advanced Directives  Does Patient Have a Medical Advance Directive? No No No No No Yes No  Type of Careers adviser;Living will   Does patient want to make changes to medical advance directive?      Yes (MAU/Ambulatory/Procedural Areas - Information given)   Copy of Healthcare Power of Attorney in Chart?      No - copy requested   Would patient like information on creating a  medical advance directive? No - Patient declined No - Patient declined Yes (MAU/Ambulatory/Procedural Areas - Information given) No - Patient declined No - Patient declined      Current Medications (verified) Outpatient Encounter Medications as of 07/01/2024  Medication Sig   amLODipine  (NORVASC ) 10 MG tablet Take 1 tablet (10 mg total) by mouth daily.   aspirin EC 81 MG tablet Take 81 mg by mouth daily.   azelaic acid  (AZELEX ) 20 % cream Apply topically as needed for up to 90 doses. After skin is thoroughly washed and patted dry, gently but thoroughly massage a thin film of azelaic acid  cream into the affected area twice daily, in the morning and evening.   azelastine  (ASTELIN ) 0.1 % nasal spray Place 2 sprays into both nostrils 2 (two) times daily.   EPINEPHrine 0.3 mg/0.3 mL IJ SOAJ injection See admin instructions.   esomeprazole  (NEXIUM ) 40 MG capsule Take 1 capsule (40 mg total) by mouth daily.   finasteride (PROSCAR) 5 MG tablet    levothyroxine  (SYNTHROID ) 100 MCG tablet Take 1 tablet (100 mcg total) by mouth daily.   LYCOPENE PO Take 1 tablet by mouth daily.   rosuvastatin  (CRESTOR ) 40 MG tablet Take 1 tablet (40 mg total) by mouth daily.   sildenafil (VIAGRA) 100 MG tablet Take 100 mg by mouth as directed.   tamsulosin  (FLOMAX ) 0.4 MG CAPS capsule    diclofenac  (VOLTAREN ) 75 MG EC tablet Take 1 tablet (75  mg total) by mouth 2 (two) times daily. (Patient not taking: Reported on 07/01/2024)   [DISCONTINUED] amoxicillin -clavulanate (AUGMENTIN ) 875-125 MG tablet Take 1 tablet by mouth 2 (two) times daily.   No facility-administered encounter medications on file as of 07/01/2024.    Allergies (verified) Patient has no known allergies.   History: Past Medical History:  Diagnosis Date   Arthritis    Cataract    GERD (gastroesophageal reflux disease)    Gout    Hyperlipidemia    Hypertension    Hypothyroidism    Seasonal allergies    Uses hearing aid    Past Surgical History:   Procedure Laterality Date   CARPAL TUNNEL RELEASE Bilateral    CATARACT EXTRACTION, BILATERAL Bilateral    KNEE ARTHROSCOPY Left    REPLACEMENT TOTAL KNEE BILATERAL  12/14/2021   Family History  Problem Relation Age of Onset   Breast cancer Mother    Heart disease Father    Diabetes Sister    Diabetes Brother    Colon cancer Neg Hx    Esophageal cancer Neg Hx    Rectal cancer Neg Hx    Stomach cancer Neg Hx    Social History   Socioeconomic History   Marital status: Married    Spouse name: Crimson Beer   Number of children: 2   Years of education: Not on file   Highest education level: 12th grade  Occupational History   Occupation: Facilities manager: [employer]   Occupation: Retired  Tobacco Use   Smoking status: Never   Smokeless tobacco: Never  Substance and Sexual Activity   Alcohol use: Yes    Alcohol/week: 0.0 standard drinks of alcohol    Comment: occasioinal   Drug use: No   Sexual activity: Yes    Partners: Female    Birth control/protection: None  Other Topics Concern   Not on file  Social History Narrative   [employer] 4 days per week. Aerobic and strength training. Christian. Wife is Cathlean, together nearly 50 years. Adult children are supportive. Will and POA in place. 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    Social Drivers of Health   Financial Resource Strain: Low Risk  (07/01/2024)   Overall Financial Resource Strain (CARDIA)    Difficulty of Paying Living Expenses: Not hard at all  Food Insecurity: No Food Insecurity (07/01/2024)   Hunger Vital Sign    Worried About Running Out of Food in the Last Year: Never true    Ran Out of Food in the Last Year: Never true  Transportation Needs: No Transportation Needs (07/01/2024)   PRAPARE - Administrator, Civil Service (Medical): No    Lack of Transportation (Non-Medical): No  Physical Activity: Sufficiently Active (07/01/2024)   Exercise Vital Sign    Days of Exercise  per Week: 5 days    Minutes of Exercise per Session: 60 min  Stress: No Stress Concern Present (07/01/2024)   [employer] of Occupational Health - Occupational Stress Questionnaire    Feeling of Stress: Not at all  Social Connections: Socially Integrated (07/01/2024)   Social Connection and Isolation Panel    Frequency of Communication with Friends and Family: More than three times a week    Frequency of Social Gatherings with Friends and Family: More than three times a week    Attends Religious Services: More than 4 times per year    Active Member of [employer] or Organizations: Yes    Attends [employer]  or Organization Meetings: 1 to 4 times per year    Marital Status: Married    Tobacco Counseling Counseling given: Not Answered    Clinical Intake:  Pre-visit preparation completed: Yes  Pain : No/denies pain     BMI - recorded: 26.63 Nutritional Status: BMI 25 -29 Overweight Nutritional Risks: None Diabetes: No  Lab Results  Component Value Date   HGBA1C 6.4 04/15/2024   HGBA1C 6.4 10/11/2023   HGBA1C 6.2 04/06/2023     How often do you need to have someone help you when you read instructions, pamphlets, or other written materials from your doctor or pharmacy?: 1 - Never  Interpreter Needed?: No  Information entered by :: Ellouise Haws .LPN   Activities of Daily Living     07/01/2024    9:24 AM  In your present state of health, do you have any difficulty performing the following activities:  Hearing? 1  Comment hearing aids  Vision? 0  Difficulty concentrating or making decisions? 0  Walking or climbing stairs? 0  Dressing or bathing? 0  Doing errands, shopping? 0  Preparing Food and eating ? N  Using the Toilet? N  In the past six months, have you accidently leaked urine? N  Do you have problems with loss of bowel control? N  Managing your Medications? N  Managing your Finances? N  Housekeeping or managing your Housekeeping? N    Patient Care  Team: Kennyth Worth HERO, MD as PCP - General (Family Medicine) Fleeta Smock, Lamar BROCKS, MD as Consulting Physician (Allergy and Immunology)  I have updated your Care Teams any recent Medical Services you may have received from other providers in the past year.     Assessment:   This is a routine wellness examination for Akash.  Hearing/Vision screen Hearing Screening - Comments:: Pt has hearing aids  Vision Screening - Comments:: Wears rx glasses - up to date with routine eye exams with  [facility]    Goals Addressed             This Visit's Progress    Patient Stated       Managing weight        Depression Screen     07/01/2024    9:28 AM 04/15/2024    9:03 AM 04/10/2024   10:57 AM 10/11/2023    9:11 AM 10/02/2023   11:13 AM 06/28/2023   11:24 AM 11/29/2022    9:48 AM  PHQ 2/9 Scores  PHQ - 2 Score 0 0 0 0 0 0 0  PHQ- 9 Score  0 0        Fall Risk     07/01/2024    9:30 AM 04/15/2024    9:03 AM 04/10/2024   10:57 AM 10/11/2023    9:11 AM 10/02/2023   11:13 AM  Fall Risk   Falls in the past year? 0 0 0 0 0  Number falls in past yr: 0 0 0 0 0  Injury with Fall? 0 0 0 0 0  Risk for fall due to : No Fall Risks No Fall Risks No Fall Risks No Fall Risks No Fall Risks  Follow up Falls prevention discussed  Falls evaluation completed      MEDICARE RISK AT HOME:  Medicare Risk at Home Any stairs in or around the home?: No If so, are there any without handrails?: No Home free of loose throw rugs in walkways, pet beds, electrical cords, etc?: Yes Adequate lighting in  your home to reduce risk of falls?: Yes Life alert?: No Use of a cane, walker or w/c?: No Grab bars in the bathroom?: Yes Shower chair or bench in shower?: Yes Elevated toilet seat or a handicapped toilet?: No  TIMED UP AND GO:  Was the test performed?  No  Cognitive Function: 6CIT completed        07/01/2024    9:31 AM 06/28/2023   11:47 AM 06/22/2022   11:22 AM 04/14/2021    8:11 AM  04/12/2020    9:55 AM  6CIT Screen  What Year? 0 points 0 points 0 points 0 points 0 points  What month? 0 points 0 points 0 points 0 points 0 points  What time? 0 points 0 points 0 points 0 points 0 points  Count back from 20 0 points 0 points 0 points 0 points 0 points  Months in reverse 0 points 0 points 0 points 0 points 0 points  Repeat phrase 0 points 0 points 2 points 4 points 2 points  Total Score 0 points 0 points 2 points 4 points 2 points    Immunizations Immunization History  Administered Date(s) Administered    sv, Bivalent, Protein Subunit Rsvpref,pf (Abrysvo) 06/25/2023   Fluad Quad(high Dose 65+) 06/05/2019, 08/03/2022   INFLUENZA, HIGH DOSE SEASONAL PF 07/13/2015, 08/01/2017, 06/21/2018, 07/30/2018, 08/03/2020, 06/15/2021, 08/03/2021, 06/25/2023   Influenza Split 06/05/2017   Influenza,inj,Quad PF,6+ Mos 07/03/2015, 06/05/2017, 06/05/2017, 07/19/2017   Influenza-Unspecified 06/05/2016, 06/14/2020   Moderna Covid-19 Fall Seasonal Vaccine 91yrs & older 02/28/2024   PFIZER Comirnaty(Gray Top)Covid-19 Tri-Sucrose Vaccine 01/24/2021   PFIZER(Purple Top)SARS-COV-2 Vaccination 10/22/2019, 11/12/2019, 07/01/2020   Pfizer Covid-19 Vaccine Bivalent Booster 92yrs & up 06/15/2021, 02/20/2022   Pfizer Covid-19 Vaccine Bivalent Booster 5y-11y 02/20/2022   Pfizer(Comirnaty)Fall Seasonal Vaccine 12 years and older 08/03/2022, 03/26/2023   Pneumococcal Conjugate-13 09/16/2015   Pneumococcal Polysaccharide-23 03/20/2018, 07/30/2018, 08/03/2020, 08/03/2021   Tdap 09/19/2006, 03/19/2017   Zoster Recombinant(Shingrix) 03/30/2018, 07/08/2018   Zoster, Live 03/05/2013    Screening Tests Health Maintenance  Topic Date Due   Influenza Vaccine  05/02/2024   Medicare Annual Wellness (AWV)  06/27/2024   COVID-19 Vaccine (11 - 2024-25 season) 08/30/2024   DTaP/Tdap/Td (3 - Td or Tdap) 03/20/2027   Colonoscopy  05/13/2028   Pneumococcal Vaccine: 50+ Years  Completed   Hepatitis C  Screening  Completed   Zoster Vaccines- Shingrix  Completed   HPV VACCINES  Aged Out   Meningococcal B Vaccine  Aged Out    Health Maintenance Items Addressed: See Nurse Notes at the end of this note  Additional Screening:  Vision Screening: Recommended annual ophthalmology exams for early detection of glaucoma and other disorders of the eye. Is the patient up to date with their annual eye exam?  Yes  Who is the provider or what is the name of the office in which the patient attends annual eye exams? [facility]   Dental Screening: Recommended annual dental exams for proper oral hygiene  Community Resource Referral / Chronic Care Management: CRR required this visit?  No   CCM required this visit?  No   Plan:    I have personally reviewed and noted the following in the patient's chart:   Medical and social history Use of alcohol, tobacco or illicit drugs  Current medications and supplements including opioid prescriptions. Patient is not currently taking opioid prescriptions. Functional ability and status Nutritional status Physical activity Advanced directives List of other physicians Hospitalizations, surgeries, and ER visits in  previous 12 months Vitals Screenings to include cognitive, depression, and falls Referrals and appointments  In addition, I have reviewed and discussed with patient certain preventive protocols, quality metrics, and best practice recommendations. A written personalized care plan for preventive services as well as general preventive health recommendations were provided to patient.   Ellouise VEAR Haws, LPN   [id number]   After Visit Summary: (MyChart) Due to this being a telephonic visit, the after visit summary with patients personalized plan was offered to patient via MyChart   Notes: Nothing significant to report at this time.

## 2024-07-01 NOTE — Patient Instructions (Signed)
 [name],  Thank you for taking the time for your Medicare Wellness Visit. I appreciate your continued commitment to your health goals. Please review the care plan we discussed, and feel free to reach out if I can assist you further.  Medicare recommends these wellness visits once per year to help you and your care team stay ahead of potential health issues. These visits are designed to focus on prevention, allowing your provider to concentrate on managing your acute and chronic conditions during your regular appointments.  Please note that Annual Wellness Visits do not include a physical exam. Some assessments may be limited, especially if the visit was conducted virtually. If needed, we may recommend a separate in-person follow-up with your provider.  Ongoing Care Seeing your primary care provider every 3 to 6 months helps us  monitor your health and provide consistent, personalized care.   Referrals If a referral was made during today's visit and you haven't received any updates within two weeks, please contact the referred provider directly to check on the status.  Recommended Screenings:  Health Maintenance  Topic Date Due   Flu Shot  05/02/2024   COVID-19 Vaccine (11 - 2024-25 season) 08/30/2024   Medicare Annual Wellness Visit  07/01/2025   DTaP/Tdap/Td vaccine (3 - Td or Tdap) 03/20/2027   Colon Cancer Screening  05/13/2028   Pneumococcal Vaccine for age over 68  Completed   Hepatitis C Screening  Completed   Zoster (Shingles) Vaccine  Completed   HPV Vaccine  Aged Out   Meningitis B Vaccine  Aged Out       07/01/2024    9:28 AM  Advanced Directives  Does Patient Have a Medical Advance Directive? No  Would patient like information on creating a medical advance directive? No - Patient declined   Advance Care Planning is important because it: Ensures you receive medical care that aligns with your values, goals, and preferences. Provides guidance to your family and loved  ones, reducing the emotional burden of decision-making during critical moments.  Vision: Annual vision screenings are recommended for early detection of glaucoma, cataracts, and diabetic retinopathy. These exams can also reveal signs of chronic conditions such as diabetes and high blood pressure.  Dental: Annual dental screenings help detect early signs of oral cancer, gum disease, and other conditions linked to overall health, including heart disease and diabetes.  Please see the attached documents for additional preventive care recommendations.

## 2024-10-28 ENCOUNTER — Ambulatory Visit (INDEPENDENT_AMBULATORY_CARE_PROVIDER_SITE_OTHER): Admitting: Physician Assistant

## 2024-10-28 ENCOUNTER — Encounter (INDEPENDENT_AMBULATORY_CARE_PROVIDER_SITE_OTHER): Payer: Self-pay | Admitting: Physician Assistant

## 2024-10-28 VITALS — BP 136/69 | HR 82 | Temp 98.1°F | Ht 67.0 in | Wt 170.0 lb

## 2024-10-28 DIAGNOSIS — J309 Allergic rhinitis, unspecified: Secondary | ICD-10-CM | POA: Diagnosis not present

## 2024-10-28 DIAGNOSIS — H6990 Unspecified Eustachian tube disorder, unspecified ear: Secondary | ICD-10-CM | POA: Diagnosis not present

## 2024-10-28 DIAGNOSIS — R040 Epistaxis: Secondary | ICD-10-CM | POA: Diagnosis not present

## 2024-10-28 DIAGNOSIS — H903 Sensorineural hearing loss, bilateral: Secondary | ICD-10-CM

## 2024-10-29 NOTE — Progress Notes (Signed)
 Dear Dr. Kennyth, Here is my assessment for our mutual patient, Brendan Simpson. Thank you for allowing me the opportunity to care for your patient. Please do not hesitate to contact me should you have any other questions. Sincerely, Chyrl Cohen PA-C  Otolaryngology Clinic Note Referring provider: Dr. Kennyth HPI:  Brendan Simpson is a 74 y.o. male kindly referred by Dr. Kennyth   Discussed the use of AI scribe software for clinical note transcription with the patient, who gave verbal consent to proceed.  History of Present Illness   Brendan Simpson is a 74 year old male with bilateral sensorineural hearing loss and Eustachian tube dysfunction who presents for otolaryngology follow-up evaluation of cerumen impaction.  He was last seen in the office on 04/22/2024.  Since that last office visit he did have 1 episode of aural fullness in the left ear after getting off a cruise ship.  He notes this was not persistent no associated neurologic symptoms.  Has completely resolved no history of the same.  He experiences epistaxis approximately once a month or less, typically precipitated by sneezing or allergic symptoms. The epistaxis is bilateral and has not increased in frequency or severity. The most recent episode occurred last week.  He recounts a single episode of prolonged disequilibrium following a cruise five to six years ago, with symptoms of imbalance and sensation of movement lasting two to three weeks after disembarkation. He has taken multiple cruises since without recurrence and denies ongoing vertigo or imbalance.  He is awaiting new hearing aids and previously paid out-of-pocket for devices prior to Medicare coverage, which now provides partial financial relief.           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    Family History  Problem Relation Age of Onset   Breast cancer Mother    Heart disease Father    Diabetes Sister    Diabetes Brother    Colon cancer Neg Hx    Esophageal cancer Neg Hx    Rectal cancer Neg Hx    Stomach cancer Neg Hx      Social Connections: Socially Integrated (07/01/2024)   Social Connection and Isolation Panel    Frequency of Communication with Friends and Family: More than three times a week    Frequency of Social Gatherings with Friends and Family: More than three times a week    Attends Religious Services: More than 4 times per year    Active Member of [employer] or Organizations: Yes    Attends Banker Meetings: 1 to 4 times per year    Marital Status: Married     Current Medications[1]   Physical Exam:   BP 136/69   Pulse 82   Temp 98.1 F (36.7 C)   Ht 5' 7 (1.702 m)   Wt 170 lb (77.1 kg)   SpO2 98%   BMI 26.63 kg/m   Pertinent Findings  CN II-XII grossly intact  Bilateral EAC clear and TM intact with well pneumatized middle ear spaces Anterior rhinoscopy: Septum midline; bilateral inferior turbinates with no hypertrophy No lesions of oral cavity/oropharynx; dentition is in normal limits No obviously palpable neck masses/lymphadenopathy/thyromegaly No respiratory distress or stridor       Seprately Identifiable Procedures:  None  Impression & Plans:  [name] is a 74 y.o. male with the following   Assessment and Plan    Eustachian tube dysfunction Intermittent ear fullness due to transient Eustachian tube dysfunction from barometric pressure changes. No cerumen impaction or acute pathology. - Reassured him about  benign symptoms. - Instructed to report persistent or worsening symptoms.  Epistaxis secondary to allergic rhinitis Occasional bilateral epistaxis post-sneezing and allergy flares, consistent with mild epistaxis from allergic rhinitis. - Reassured him about benign symptoms.          - f/u PRN   Thank you for allowing me the opportunity to care for your patient. Please do not hesitate to contact me should you have any other questions.  Sincerely, Chyrl Cohen PA-C [facility] Phone: [phone] Fax: [phone]  10/29/2024, 9:42 AM        [1]  Current Outpatient Medications:    amLODipine  (NORVASC ) 10 MG tablet, Take 1 tablet (10 mg total) by mouth daily., Disp: 90 tablet, Rfl: 3   aspirin EC 81 MG tablet, Take 81 mg by mouth daily., Disp: , Rfl:    azelaic acid  (AZELEX ) 20 % cream, Apply topically as needed for up to 90 doses. After skin is thoroughly washed and patted dry, gently but thoroughly massage a thin film of azelaic acid  cream into the affected area twice daily, in the morning and evening., Disp: 50 g, Rfl: 2   azelastine  (ASTELIN ) 0.1 % nasal spray, Place 2 sprays into both nostrils 2 (two) times daily., Disp: 30 mL, Rfl: 12   EPINEPHrine 0.3 mg/0.3 mL IJ SOAJ injection, See admin instructions., Disp: , Rfl:    esomeprazole  (NEXIUM ) 40 MG capsule, Take 1 capsule (40 mg total) by mouth daily., Disp: 90 capsule, Rfl: 3   finasteride (PROSCAR) 5 MG tablet, , Disp: , Rfl:    levothyroxine  (SYNTHROID ) 100 MCG tablet, Take 1 tablet (100 mcg total) by mouth daily., Disp: 90 tablet, Rfl: 3   LYCOPENE PO, Take 1 tablet by mouth daily., Disp: , Rfl:    rosuvastatin  (CRESTOR ) 40 MG tablet, Take 1 tablet (40 mg total) by mouth daily., Disp: 90 tablet, Rfl: 3   sildenafil (VIAGRA) 100 MG tablet, Take 100 mg by mouth as directed., Disp: , Rfl:    tamsulosin  (FLOMAX ) 0.4 MG CAPS capsule, , Disp: , Rfl:    diclofenac  (VOLTAREN ) 75 MG EC tablet, Take 1 tablet (75  mg total) by mouth 2 (two) times daily. (Patient not taking: Reported on 10/28/2024), Disp: 50 tablet, Rfl: 2

## 2025-04-16 ENCOUNTER — Encounter: Admitting: Family Medicine

## 2025-07-07 ENCOUNTER — Ambulatory Visit
